# Patient Record
Sex: Male | Born: 1940 | Hispanic: No | Marital: Married | Smoking: Former smoker
Health system: Southern US, Community
[De-identification: ages and names within clinical notes are randomized; demographics above are authoritative.]

## PROBLEM LIST (undated history)

## (undated) DIAGNOSIS — I10 Essential (primary) hypertension: Secondary | ICD-10-CM

## (undated) DIAGNOSIS — F329 Major depressive disorder, single episode, unspecified: Secondary | ICD-10-CM

## (undated) DIAGNOSIS — I729 Aneurysm of unspecified site: Secondary | ICD-10-CM

## (undated) DIAGNOSIS — Z973 Presence of spectacles and contact lenses: Secondary | ICD-10-CM

## (undated) DIAGNOSIS — I340 Nonrheumatic mitral (valve) insufficiency: Secondary | ICD-10-CM

## (undated) DIAGNOSIS — R9431 Abnormal electrocardiogram [ECG] [EKG]: Secondary | ICD-10-CM

## (undated) DIAGNOSIS — R519 Headache, unspecified: Secondary | ICD-10-CM

## (undated) DIAGNOSIS — F0390 Unspecified dementia without behavioral disturbance: Secondary | ICD-10-CM

## (undated) DIAGNOSIS — I639 Cerebral infarction, unspecified: Secondary | ICD-10-CM

## (undated) DIAGNOSIS — F411 Generalized anxiety disorder: Secondary | ICD-10-CM

## (undated) DIAGNOSIS — R002 Palpitations: Secondary | ICD-10-CM

## (undated) DIAGNOSIS — C801 Malignant (primary) neoplasm, unspecified: Secondary | ICD-10-CM

## (undated) DIAGNOSIS — I441 Atrioventricular block, second degree: Secondary | ICD-10-CM

## (undated) DIAGNOSIS — E7800 Pure hypercholesterolemia, unspecified: Secondary | ICD-10-CM

## (undated) DIAGNOSIS — R413 Other amnesia: Secondary | ICD-10-CM

## (undated) HISTORY — DX: Nonrheumatic mitral (valve) insufficiency: I34.0

## (undated) HISTORY — DX: Abnormal electrocardiogram (ECG) (EKG): R94.31

## (undated) HISTORY — PX: CHOLECYSTECTOMY: SHX55

## (undated) HISTORY — DX: Palpitations: R00.2

## (undated) HISTORY — DX: Atrioventricular block, second degree: I44.1

## (undated) HISTORY — DX: Essential (primary) hypertension: I10

## (undated) HISTORY — DX: Major depressive disorder, single episode, unspecified: F32.9

## (undated) HISTORY — PX: CATARACT EXTRACTION, BILATERAL: SHX1313

## (undated) HISTORY — DX: Generalized anxiety disorder: F41.1

## (undated) HISTORY — DX: Pure hypercholesterolemia, unspecified: E78.00

## (undated) HISTORY — PX: COLON SURGERY: SHX602

## (undated) HISTORY — PX: PROSTATE BIOPSY: SHX241

---
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
## 2000-08-04 HISTORY — PX: MITRAL VALVE REPLACEMENT: SHX147

## 2006-10-08 ENCOUNTER — Encounter: Admission: RE | Admit: 2006-10-08 | Discharge: 2006-10-08 | Payer: Self-pay | Admitting: Family Medicine

## 2008-03-10 DIAGNOSIS — B009 Herpesviral infection, unspecified: Secondary | ICD-10-CM

## 2008-03-10 HISTORY — DX: Herpesviral infection, unspecified: B00.9

## 2008-07-12 ENCOUNTER — Encounter: Admission: RE | Admit: 2008-07-12 | Discharge: 2008-07-12 | Payer: Self-pay | Admitting: Family Medicine

## 2008-12-26 DIAGNOSIS — L84 Corns and callosities: Secondary | ICD-10-CM | POA: Insufficient documentation

## 2009-05-21 DIAGNOSIS — M21619 Bunion of unspecified foot: Secondary | ICD-10-CM | POA: Insufficient documentation

## 2009-10-24 DIAGNOSIS — J309 Allergic rhinitis, unspecified: Secondary | ICD-10-CM

## 2009-10-24 HISTORY — DX: Allergic rhinitis, unspecified: J30.9

## 2010-03-18 ENCOUNTER — Ambulatory Visit: Payer: Self-pay | Admitting: Cardiology

## 2010-04-15 ENCOUNTER — Ambulatory Visit: Payer: Self-pay | Admitting: Cardiology

## 2010-05-13 ENCOUNTER — Ambulatory Visit: Payer: Self-pay | Admitting: Cardiology

## 2010-05-28 ENCOUNTER — Ambulatory Visit: Payer: Self-pay | Admitting: Cardiology

## 2010-06-11 ENCOUNTER — Ambulatory Visit: Payer: Self-pay | Admitting: Cardiovascular Disease

## 2010-07-02 ENCOUNTER — Ambulatory Visit: Payer: Self-pay | Admitting: Cardiology

## 2010-07-10 ENCOUNTER — Ambulatory Visit: Payer: Self-pay | Admitting: Cardiology

## 2010-08-14 ENCOUNTER — Ambulatory Visit: Payer: Self-pay | Admitting: Cardiology

## 2010-09-23 ENCOUNTER — Encounter (INDEPENDENT_AMBULATORY_CARE_PROVIDER_SITE_OTHER): Payer: Medicare Other

## 2010-09-23 DIAGNOSIS — I059 Rheumatic mitral valve disease, unspecified: Secondary | ICD-10-CM

## 2010-09-23 DIAGNOSIS — Z7901 Long term (current) use of anticoagulants: Secondary | ICD-10-CM

## 2010-09-30 ENCOUNTER — Encounter (INDEPENDENT_AMBULATORY_CARE_PROVIDER_SITE_OTHER): Payer: Medicare Other

## 2010-09-30 DIAGNOSIS — I059 Rheumatic mitral valve disease, unspecified: Secondary | ICD-10-CM

## 2010-09-30 DIAGNOSIS — Z7901 Long term (current) use of anticoagulants: Secondary | ICD-10-CM

## 2010-10-07 DIAGNOSIS — I8390 Asymptomatic varicose veins of unspecified lower extremity: Secondary | ICD-10-CM

## 2010-10-07 DIAGNOSIS — N138 Other obstructive and reflux uropathy: Secondary | ICD-10-CM | POA: Insufficient documentation

## 2010-10-07 DIAGNOSIS — E559 Vitamin D deficiency, unspecified: Secondary | ICD-10-CM | POA: Insufficient documentation

## 2010-10-07 DIAGNOSIS — E7800 Pure hypercholesterolemia, unspecified: Secondary | ICD-10-CM | POA: Insufficient documentation

## 2010-10-07 HISTORY — DX: Asymptomatic varicose veins of unspecified lower extremity: I83.90

## 2010-10-07 HISTORY — DX: Vitamin D deficiency, unspecified: E55.9

## 2010-10-07 HISTORY — DX: Benign prostatic hyperplasia with lower urinary tract symptoms: N13.8

## 2010-10-07 HISTORY — DX: Pure hypercholesterolemia, unspecified: E78.00

## 2010-10-23 ENCOUNTER — Other Ambulatory Visit: Payer: Self-pay | Admitting: *Deleted

## 2010-10-23 DIAGNOSIS — R002 Palpitations: Secondary | ICD-10-CM

## 2010-10-23 MED ORDER — METOPROLOL SUCCINATE ER 50 MG PO TB24: 50.0000 mg | ORAL_TABLET | Freq: Every day | ORAL | Status: DC

## 2010-10-28 ENCOUNTER — Ambulatory Visit (INDEPENDENT_AMBULATORY_CARE_PROVIDER_SITE_OTHER): Payer: Medicare Other | Admitting: *Deleted

## 2010-10-28 DIAGNOSIS — Z7901 Long term (current) use of anticoagulants: Secondary | ICD-10-CM

## 2010-10-28 DIAGNOSIS — I059 Rheumatic mitral valve disease, unspecified: Secondary | ICD-10-CM

## 2010-10-28 LAB — POCT INR: INR: 3.2

## 2010-11-26 DIAGNOSIS — R972 Elevated prostate specific antigen [PSA]: Secondary | ICD-10-CM

## 2010-11-26 HISTORY — DX: Elevated prostate specific antigen (PSA): R97.20

## 2010-12-11 ENCOUNTER — Encounter: Payer: Self-pay | Admitting: Cardiology

## 2010-12-11 DIAGNOSIS — E7800 Pure hypercholesterolemia, unspecified: Secondary | ICD-10-CM | POA: Insufficient documentation

## 2010-12-11 DIAGNOSIS — R002 Palpitations: Secondary | ICD-10-CM | POA: Insufficient documentation

## 2010-12-11 DIAGNOSIS — I10 Essential (primary) hypertension: Secondary | ICD-10-CM | POA: Insufficient documentation

## 2010-12-11 DIAGNOSIS — I340 Nonrheumatic mitral (valve) insufficiency: Secondary | ICD-10-CM | POA: Insufficient documentation

## 2010-12-16 ENCOUNTER — Encounter: Payer: Self-pay | Admitting: Cardiology

## 2010-12-16 ENCOUNTER — Ambulatory Visit (INDEPENDENT_AMBULATORY_CARE_PROVIDER_SITE_OTHER): Payer: Medicare Other | Admitting: *Deleted

## 2010-12-16 ENCOUNTER — Ambulatory Visit (INDEPENDENT_AMBULATORY_CARE_PROVIDER_SITE_OTHER): Payer: Medicare Other | Admitting: Cardiology

## 2010-12-16 VITALS — BP 118/68 | HR 60 | Ht 67.0 in | Wt 191.5 lb

## 2010-12-16 DIAGNOSIS — I10 Essential (primary) hypertension: Secondary | ICD-10-CM

## 2010-12-16 DIAGNOSIS — R002 Palpitations: Secondary | ICD-10-CM

## 2010-12-16 DIAGNOSIS — I059 Rheumatic mitral valve disease, unspecified: Secondary | ICD-10-CM

## 2010-12-16 NOTE — Assessment & Plan Note (Signed)
Blood pressure is well-controlled today. 

## 2010-12-16 NOTE — Progress Notes (Signed)
   Nathan Mcdaniel Date of Birth: April 13, 1941   History of Present Illness: Nathan Mcdaniel is seen for followup today. 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 He has noted some mild palpitations in the evening. He denies any chest pain or shortness of breath. He doesn't really get any regular aerobic activity.  Current Outpatient Prescriptions on File Prior to Visit  Medication Sig Dispense Refill  . Cholecalciferol 5000 UNITS capsule Take 5,000 Units by mouth daily.        . Cyanocobalamin (VITAMIN B-12 PO) Take 1 tablet by mouth daily.        Marland Kitchen LORazepam (ATIVAN) 0.5 MG tablet Take 0.5 mg by mouth every 8 (eight) hours as needed.        . metoprolol (TOPROL XL) 50 MG 24 hr tablet Take 1 tablet (50 mg total) by mouth daily.  90 tablet  3  . Multiple Vitamin (MULTIVITAMIN) tablet Take 1 tablet by mouth daily.        . simvastatin (ZOCOR) 40 MG tablet Take 40 mg by mouth at bedtime.        . Tamsulosin HCl (FLOMAX) 0.4 MG CAPS Take 0.4 mg by mouth daily.        Marland Kitchen DISCONTD: warfarin (COUMADIN) 5 MG tablet Take 3 mg by mouth daily. AS DIRECTED        Allergies  Allergen Reactions  . Nsaids   . Percocet (Oxycodone-Acetaminophen)     Past Medical History  Diagnosis Date  . Hypertension   . Palpitations   . Mitral insufficiency   . Hypercholesterolemia     Past Surgical History  Procedure Date  . Mitral valve replacement 2002    with a st.jude mechanical valve  . Cholecystectomy   . Colon surgery     with diversion and resection  . Prostate biopsy     History  Smoking status  . Former Smoker -- 0.8 packs/day for 18 years  . Types: Cigarettes  . Quit date: 08/05/1979  Smokeless tobacco  . Never Used    History  Alcohol Use No    Family History  Problem Relation Age of Onset  . Heart failure Mother   . Hypertension Mother   . Lung cancer Sister     Review of Systems: The review of systems is positive for  some fatigue. He feels increased stress. All other systems were reviewed and are negative.  Physical Exam: BP 118/68  Pulse 60  Ht 5\' 7"  (1.702 m)  Wt 191 lb 8 oz (86.864 kg)  BMI 29.99 kg/m2 He is a pleasant male in no acute distress. He is normocephalic, atraumatic. Pupils are equal round and reactive. Sclera clear. Oropharynx is clear. Neck is supple without JVD, adenopathy, thyromegaly, or bruits. Lungs are clear. Cardiac exam reveals a regular rate and rhythm with a good mechanical mitral valve click. There is no gallop. Abdomen is soft and nontender. He has no masses or bruits. Pedal pulses are good. He has no edema. He is alert and oriented x3. Cranial nerves II through XII are intact.  LABORATORY DATA: INR today is 2.8. ECG demonstrates sinus bradycardia with first degree AV block and nonspecific T-wave abnormality.  Assessment / Plan:

## 2010-12-16 NOTE — Patient Instructions (Addendum)
Continue your current medications including coumadin.  Walk 30 minutes a day. This will help with stress.  Avoid caffeine and decongestants.  I will see you back in 6 months.

## 2010-12-16 NOTE — Assessment & Plan Note (Signed)
His palpitations are fairly minor at this point. He is on beta blocker therapy. I have recommended that he get regular aerobic exercise 30 minutes a day to try and help manage his stress. I recommended he avoid caffeine and decongestants.

## 2010-12-16 NOTE — Assessment & Plan Note (Signed)
His valve sounds are normal. He is anticoagulated appropriately. He will need long-term SBE prophylaxis.

## 2011-01-13 ENCOUNTER — Ambulatory Visit (INDEPENDENT_AMBULATORY_CARE_PROVIDER_SITE_OTHER): Payer: Medicare Other | Admitting: *Deleted

## 2011-01-13 DIAGNOSIS — I059 Rheumatic mitral valve disease, unspecified: Secondary | ICD-10-CM

## 2011-01-13 LAB — POCT INR: INR: 2.6

## 2011-02-10 ENCOUNTER — Ambulatory Visit (INDEPENDENT_AMBULATORY_CARE_PROVIDER_SITE_OTHER): Payer: Medicare Other | Admitting: *Deleted

## 2011-02-10 DIAGNOSIS — I059 Rheumatic mitral valve disease, unspecified: Secondary | ICD-10-CM

## 2011-02-11 LAB — POCT INR: INR: 3.1

## 2011-03-10 ENCOUNTER — Encounter: Payer: Medicare Other | Admitting: *Deleted

## 2011-03-10 ENCOUNTER — Ambulatory Visit (INDEPENDENT_AMBULATORY_CARE_PROVIDER_SITE_OTHER): Payer: Medicare Other | Admitting: *Deleted

## 2011-03-10 DIAGNOSIS — I059 Rheumatic mitral valve disease, unspecified: Secondary | ICD-10-CM

## 2011-03-24 ENCOUNTER — Ambulatory Visit (INDEPENDENT_AMBULATORY_CARE_PROVIDER_SITE_OTHER): Payer: Medicare Other | Admitting: *Deleted

## 2011-03-24 DIAGNOSIS — I059 Rheumatic mitral valve disease, unspecified: Secondary | ICD-10-CM

## 2011-03-24 LAB — POCT INR: INR: 2.7

## 2011-04-09 ENCOUNTER — Telehealth: Payer: Self-pay | Admitting: Cardiology

## 2011-04-09 NOTE — Telephone Encounter (Signed)
Faxed last OV Note, EKG, and ECHO available today.

## 2011-04-09 NOTE — Telephone Encounter (Signed)
Please fax recent test results and notes.

## 2011-04-09 NOTE — Telephone Encounter (Signed)
No Stress available.  All other info faxed today.

## 2011-04-16 ENCOUNTER — Ambulatory Visit (HOSPITAL_BASED_OUTPATIENT_CLINIC_OR_DEPARTMENT_OTHER)
Admission: RE | Admit: 2011-04-16 | Discharge: 2011-04-16 | Disposition: A | Discharge status: Home / Self Care | Payer: Medicare Other | Source: Ambulatory Visit | Attending: Orthopedic Surgery | Admitting: Orthopedic Surgery

## 2011-04-16 DIAGNOSIS — Z7901 Long term (current) use of anticoagulants: Secondary | ICD-10-CM | POA: Insufficient documentation

## 2011-04-16 DIAGNOSIS — Z01812 Encounter for preprocedural laboratory examination: Secondary | ICD-10-CM | POA: Insufficient documentation

## 2011-04-16 DIAGNOSIS — G5600 Carpal tunnel syndrome, unspecified upper limb: Secondary | ICD-10-CM | POA: Insufficient documentation

## 2011-04-16 LAB — POCT I-STAT, CHEM 8
BUN: 20 mg/dL (ref 6–23)
Calcium, Ion: 1.19 mmol/L (ref 1.12–1.32)
Chloride: 105 mEq/L (ref 96–112)
Creatinine, Ser: 0.9 mg/dL (ref 0.50–1.35)
Sodium: 140 mEq/L (ref 135–145)
TCO2: 27 mmol/L (ref 0–100)

## 2011-04-16 LAB — PROTIME-INR
INR: 2.35 — ABNORMAL HIGH (ref 0.00–1.49)
Prothrombin Time: 26.1 seconds — ABNORMAL HIGH (ref 11.6–15.2)

## 2011-04-21 ENCOUNTER — Ambulatory Visit (INDEPENDENT_AMBULATORY_CARE_PROVIDER_SITE_OTHER): Payer: Medicare Other | Admitting: *Deleted

## 2011-04-21 ENCOUNTER — Other Ambulatory Visit: Payer: Self-pay | Admitting: *Deleted

## 2011-04-21 DIAGNOSIS — I059 Rheumatic mitral valve disease, unspecified: Secondary | ICD-10-CM

## 2011-04-21 LAB — POCT INR: INR: 3.3

## 2011-05-06 NOTE — Op Note (Signed)
  NAME:  Nathan Mcdaniel, Nathan Mcdaniel NO.:  [id number]  MEDICAL RECORD NO.:  [id number]  LOCATION:                                 FACILITY:  PHYSICIAN:  Cindee Salt, M.D.            DATE OF BIRTH:  DATE OF PROCEDURE:  04/16/2011 DATE OF DISCHARGE:                              OPERATIVE REPORT   PREOPERATIVE DIAGNOSIS:  Carpal tunnel syndrome left hand.  POSTOPERATIVE DIAGNOSIS:  Carpal tunnel syndrome left hand.  OPERATION:  Decompression left median nerve.  SURGEON:  Cindee Salt, MD  ANESTHESIA:  Forearm based IV regional.  ANESTHESIOLOGIST:  Janetta Hora. Gelene Mink, MD  HISTORY:  The patient is a 70 year old male with a history of carpal tunnel syndrome.  EMG nerve conductions positive which has not responded to conservative treatment.  It was elected to undergo surgical decompression.  Pre, peri and postoperative course have been discussed along with risks and complications.  He is aware that there is no guarantee with surgery, possibility of infection, recurrence injury to arteries, nerves, tendons, incomplete relief of symptoms, and dystrophy. Preoperative area, the patient is seen.  The extremity marked by both the patient and surgeon.  Antibiotic given.  PROCEDURE:  The patient was brought to the operating room where a forearm based IV regional anesthetic was carried out without difficulty. He was prepped using ChloraPrep, supine position with left arm free.  A 3-minute dry time was allowed.  Time-out taken confirming the patient and procedure.  A longitudinal incision was made in the skin, carried down through subcutaneous tissue.  Bleeders were meticulously cauterized due to his being on Coumadin with bipolar.  The palmar fascia was split, superficial palmar arch identified, the flexor tendon to the ring and little finger identified.  To the ulnar side of the median nerve, the carpal retinaculum was incised with sharp dissection.  Right angle and Sewall  retractor were placed between skin and forearm fascia.  The fascia released for approximately a centimeter and half proximal to the wrist crease under direct vision.  The canal was explored.  Air of compression to the nerve was apparent.  No further lesions were identified.  Wound was irrigated.  The skin was closed after spraying thrombin to assure coagulation with interrupted 4-0 Vicryl Rapide sutures.  A sterile compressive dressing was applied.  The tourniquet deflated.  Pressure was held on the wound for 5 minutes.  The remainder of the dressing was placed.  On deflation of the tourniquet, all fingers immediately pinked.  He was taken to the recovery room for observation in satisfactory condition.  He will be discharged home to return to the [facility] in 1 week on Vicodin.          ______________________________ Cindee Salt, M.D.     GK/MEDQ  D:  04/16/2011  T:  04/16/2011  Job:  [id number]  cc:   Maryelizabeth Rowan, M.D.  Electronically Signed by Cindee Salt M.D. on 05/06/2011 04:40:01 PM

## 2011-05-19 ENCOUNTER — Encounter: Payer: Medicare Other | Admitting: *Deleted

## 2011-05-26 ENCOUNTER — Ambulatory Visit (INDEPENDENT_AMBULATORY_CARE_PROVIDER_SITE_OTHER): Payer: Medicare Other | Admitting: *Deleted

## 2011-05-26 DIAGNOSIS — I059 Rheumatic mitral valve disease, unspecified: Secondary | ICD-10-CM

## 2011-05-26 DIAGNOSIS — Z954 Presence of other heart-valve replacement: Secondary | ICD-10-CM

## 2011-05-26 LAB — POCT INR: INR: 2.5

## 2011-06-23 ENCOUNTER — Ambulatory Visit (INDEPENDENT_AMBULATORY_CARE_PROVIDER_SITE_OTHER): Payer: Medicare Other | Admitting: *Deleted

## 2011-06-23 DIAGNOSIS — I059 Rheumatic mitral valve disease, unspecified: Secondary | ICD-10-CM

## 2011-06-23 DIAGNOSIS — Z954 Presence of other heart-valve replacement: Secondary | ICD-10-CM

## 2011-06-23 LAB — POCT INR: INR: 2.7

## 2011-07-09 ENCOUNTER — Ambulatory Visit (INDEPENDENT_AMBULATORY_CARE_PROVIDER_SITE_OTHER): Payer: Medicare Other | Admitting: Cardiology

## 2011-07-09 ENCOUNTER — Encounter: Payer: Self-pay | Admitting: Cardiology

## 2011-07-09 VITALS — BP 138/80 | HR 58 | Ht 67.0 in | Wt 201.0 lb

## 2011-07-09 DIAGNOSIS — I10 Essential (primary) hypertension: Secondary | ICD-10-CM

## 2011-07-09 DIAGNOSIS — I059 Rheumatic mitral valve disease, unspecified: Secondary | ICD-10-CM

## 2011-07-09 DIAGNOSIS — Z952 Presence of prosthetic heart valve: Secondary | ICD-10-CM

## 2011-07-09 DIAGNOSIS — Z7901 Long term (current) use of anticoagulants: Secondary | ICD-10-CM

## 2011-07-09 DIAGNOSIS — E7800 Pure hypercholesterolemia, unspecified: Secondary | ICD-10-CM

## 2011-07-09 NOTE — Assessment & Plan Note (Signed)
Blood work is followed by his primary care. He is on statin therapy.

## 2011-07-09 NOTE — Progress Notes (Signed)
   Nathan Mcdaniel Date of Birth: 06/16/1941   History of Present Illness: Nathan Mcdaniel is seen for followup today. He did have surgery for carpal tunnel syndrome in his left wrist in September. He denies any chest pain or shortness of breath. He doesn't really get any regular aerobic activity. He reports that Dr. Duanne Guess checks his cholesterol and that his last level was 139.  Current Outpatient Prescriptions on File Prior to Visit  Medication Sig Dispense Refill  . Cholecalciferol 5000 UNITS capsule Take 5,000 Units by mouth daily.        . Cyanocobalamin (VITAMIN B-12 PO) Take 1 tablet by mouth daily.        Marland Kitchen LORazepam (ATIVAN) 0.5 MG tablet Take 0.5 mg by mouth every 8 (eight) hours as needed.        . metoprolol (TOPROL XL) 50 MG 24 hr tablet Take 1 tablet (50 mg total) by mouth daily.  90 tablet  3  . Multiple Vitamin (MULTIVITAMIN) tablet Take 1 tablet by mouth daily.        . simvastatin (ZOCOR) 40 MG tablet Take 40 mg by mouth at bedtime.        . Tamsulosin HCl (FLOMAX) 0.4 MG CAPS Take 0.4 mg by mouth daily.        Marland Kitchen warfarin (COUMADIN) 3 MG tablet Take 3 mg by mouth as directed.          Allergies  Allergen Reactions  . Nsaids   . Percocet (Oxycodone-Acetaminophen)     Past Medical History  Diagnosis Date  . Hypertension   . Palpitations   . Mitral insufficiency   . Hypercholesterolemia     Past Surgical History  Procedure Date  . Mitral valve replacement 2002    with a st.jude mechanical valve  . Cholecystectomy   . Colon surgery     with diversion and resection  . Prostate biopsy     History  Smoking status  . Former Smoker -- 0.8 packs/day for 18 years  . Types: Cigarettes  . Quit date: 08/05/1979  Smokeless tobacco  . Never Used    History  Alcohol Use No    Family History  Problem Relation Age of Onset  . Heart failure Mother   . Hypertension Mother   . Lung cancer Sister     Review of Systems: The review of systems is positive for some  fatigue. He still has some soreness at his wrist site. All other systems were reviewed and are negative.  Physical Exam: BP 138/80  Pulse 58  Ht 5\' 7"  (1.702 m)  Wt 91.173 kg (201 lb)  BMI 31.48 kg/m2 He is a pleasant male in no acute distress. He is normocephalic, atraumatic. Pupils are equal round and reactive. Sclera clear. Oropharynx is clear. Neck is supple without JVD, adenopathy, thyromegaly, or bruits. Lungs are clear. Cardiac exam reveals a regular rate and rhythm with occasional extrasystole and with a good mechanical mitral valve click. There is no gallop. Abdomen is soft and nontender. He has no masses or bruits. Pedal pulses are good. He has no edema. He is alert and oriented x3. Cranial nerves II through XII are intact.  LABORATORY DATA: INR last was 2.7.  Assessment / Plan:

## 2011-07-09 NOTE — Assessment & Plan Note (Signed)
Status post mechanical mitral valve replacement in 2002. He remains asymptomatic. His exam is stable. He will continue with chronic anticoagulation and routine SBE prophylaxis. I will followup again in 6 months.

## 2011-07-09 NOTE — Assessment & Plan Note (Signed)
Blood pressure is under excellent control today. 

## 2011-07-09 NOTE — Patient Instructions (Signed)
Continue your current medications and follow up in the coumadin clinic.  I will see you again in 6 months

## 2011-08-04 ENCOUNTER — Ambulatory Visit (INDEPENDENT_AMBULATORY_CARE_PROVIDER_SITE_OTHER): Payer: Medicare Other | Admitting: *Deleted

## 2011-08-04 DIAGNOSIS — Z954 Presence of other heart-valve replacement: Secondary | ICD-10-CM

## 2011-08-04 DIAGNOSIS — I059 Rheumatic mitral valve disease, unspecified: Secondary | ICD-10-CM

## 2011-08-27 ENCOUNTER — Other Ambulatory Visit: Payer: Self-pay | Admitting: Cardiology

## 2011-08-28 ENCOUNTER — Other Ambulatory Visit: Payer: Self-pay

## 2011-08-28 MED ORDER — WARFARIN SODIUM 3 MG PO TABS: ORAL_TABLET | ORAL | Status: DC

## 2011-09-15 ENCOUNTER — Ambulatory Visit (INDEPENDENT_AMBULATORY_CARE_PROVIDER_SITE_OTHER): Payer: Medicare Other | Admitting: *Deleted

## 2011-09-15 DIAGNOSIS — I059 Rheumatic mitral valve disease, unspecified: Secondary | ICD-10-CM

## 2011-10-14 DIAGNOSIS — I4891 Unspecified atrial fibrillation: Secondary | ICD-10-CM

## 2011-10-14 HISTORY — DX: Unspecified atrial fibrillation: I48.91

## 2011-10-27 ENCOUNTER — Ambulatory Visit (INDEPENDENT_AMBULATORY_CARE_PROVIDER_SITE_OTHER): Payer: Medicare Other

## 2011-10-27 DIAGNOSIS — I059 Rheumatic mitral valve disease, unspecified: Secondary | ICD-10-CM

## 2011-10-27 LAB — POCT INR: INR: 4.7

## 2011-11-10 ENCOUNTER — Ambulatory Visit (INDEPENDENT_AMBULATORY_CARE_PROVIDER_SITE_OTHER): Payer: Medicare Other

## 2011-11-10 DIAGNOSIS — I059 Rheumatic mitral valve disease, unspecified: Secondary | ICD-10-CM

## 2011-11-24 ENCOUNTER — Ambulatory Visit (INDEPENDENT_AMBULATORY_CARE_PROVIDER_SITE_OTHER): Payer: Medicare Other | Admitting: Pharmacist

## 2011-11-24 DIAGNOSIS — Z7901 Long term (current) use of anticoagulants: Secondary | ICD-10-CM

## 2011-11-24 DIAGNOSIS — I059 Rheumatic mitral valve disease, unspecified: Secondary | ICD-10-CM

## 2011-12-28 ENCOUNTER — Other Ambulatory Visit: Payer: Self-pay | Admitting: Cardiology

## 2012-01-05 ENCOUNTER — Ambulatory Visit (INDEPENDENT_AMBULATORY_CARE_PROVIDER_SITE_OTHER): Payer: Medicare Other | Admitting: Pharmacist

## 2012-01-05 DIAGNOSIS — I059 Rheumatic mitral valve disease, unspecified: Secondary | ICD-10-CM

## 2012-01-05 DIAGNOSIS — Z7901 Long term (current) use of anticoagulants: Secondary | ICD-10-CM

## 2012-01-05 LAB — POCT INR: INR: 3

## 2012-01-05 MED ORDER — WARFARIN SODIUM 3 MG PO TABS: ORAL_TABLET | ORAL | Status: DC

## 2012-01-19 ENCOUNTER — Encounter: Payer: Self-pay | Admitting: Cardiology

## 2012-01-19 ENCOUNTER — Ambulatory Visit (INDEPENDENT_AMBULATORY_CARE_PROVIDER_SITE_OTHER): Payer: Medicare Other | Admitting: Cardiology

## 2012-01-19 VITALS — BP 126/76 | HR 60

## 2012-01-19 DIAGNOSIS — Z952 Presence of prosthetic heart valve: Secondary | ICD-10-CM | POA: Insufficient documentation

## 2012-01-19 DIAGNOSIS — R002 Palpitations: Secondary | ICD-10-CM

## 2012-01-19 DIAGNOSIS — I059 Rheumatic mitral valve disease, unspecified: Secondary | ICD-10-CM

## 2012-01-19 DIAGNOSIS — I491 Atrial premature depolarization: Secondary | ICD-10-CM

## 2012-01-19 DIAGNOSIS — Z954 Presence of other heart-valve replacement: Secondary | ICD-10-CM

## 2012-01-19 DIAGNOSIS — Z7901 Long term (current) use of anticoagulants: Secondary | ICD-10-CM

## 2012-01-19 HISTORY — DX: Presence of prosthetic heart valve: Z95.2

## 2012-01-19 NOTE — Assessment & Plan Note (Signed)
ECG demonstrates PACs. These are benign and do not require further management. He is already on metoprolol.

## 2012-01-19 NOTE — Assessment & Plan Note (Signed)
His valve exam is normal today. He is on anticoagulation and has been therapeutic. We'll continue with his current management.

## 2012-01-19 NOTE — Progress Notes (Signed)
   [employer] [employer] Nathan Mcdaniel Date of Birth: 1940-08-25   History of Present Illness: Nathan Mcdaniel is seen for followup today. 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 He admits that he doesn't get enough exercise and is planning to join a gym. He feels that his heart rate is more regular than it has been in the past. He denies any chest pain or shortness of breath. His Coumadin checks have been therapeutic.  Current Outpatient Prescriptions on File Prior to Visit  Medication Sig Dispense Refill  . Cholecalciferol 5000 UNITS capsule Take 5,000 Units by mouth daily.        . Cyanocobalamin (VITAMIN B-12 PO) Take 1 tablet by mouth daily.        Marland Kitchen LORazepam (ATIVAN) 0.5 MG tablet Take 0.5 mg by mouth every 8 (eight) hours as needed.        . metoprolol succinate (TOPROL-XL) 50 MG 24 hr tablet TAKE 1 TABLET (50MG  TOTAL) DAILY  30 tablet  5  . Multiple Vitamin (MULTIVITAMIN) tablet Take 1 tablet by mouth daily.        . simvastatin (ZOCOR) 40 MG tablet Take 40 mg by mouth at bedtime.        . Tamsulosin HCl (FLOMAX) 0.4 MG CAPS Take 0.4 mg by mouth daily.        Marland Kitchen warfarin (COUMADIN) 3 MG tablet Take as directed by anticoagulation clinic.  Pt takes 1 tablet daily except 1 1/2 tablets on Monday  110 tablet  1    Allergies  Allergen Reactions  . Nsaids   . Percocet (Oxycodone-Acetaminophen)     Past Medical History  Diagnosis Date  . Hypertension   . Palpitations   . Mitral insufficiency   . Hypercholesterolemia     Past Surgical History  Procedure Date  . Mitral valve replacement 2002    with a st.jude mechanical valve  . Cholecystectomy   . Colon surgery     with diversion and resection  . Prostate biopsy     History  Smoking status  . Former Smoker -- 0.8 packs/day for 18 years  . Types: Cigarettes  . Quit date: 08/05/1979  Smokeless tobacco  . Never Used    History  Alcohol Use No    Family History  Problem Relation Age of Onset  . Heart failure  Mother   . Hypertension Mother   . Lung cancer Sister     Review of Systems: As noted in history of present illness. All other systems were reviewed and are negative.  Physical Exam: BP 126/76  Pulse 60 He is a pleasant male in no acute distress. He is normocephalic, atraumatic. Pupils are equal round and reactive. Sclera clear. Oropharynx is clear. Neck is supple without JVD, adenopathy, thyromegaly, or bruits. Lungs are clear. Cardiac exam reveals a regular rate and rhythm with occasional extrasystole and with a good mechanical mitral valve click. There is no gallop. Abdomen is soft and nontender. He has no masses or bruits. Pedal pulses are good. He has no edema. He is alert and oriented x3. Cranial nerves II through XII are intact.  LABORATORY DATA: INR last was 3.0 ECG today demonstrates normal sinus rhythm with frequent PACs. It is otherwise normal.  Assessment / Plan:

## 2012-01-19 NOTE — Patient Instructions (Signed)
Continue your current therapy  I will see you again in 6 months.   

## 2012-02-16 ENCOUNTER — Ambulatory Visit (INDEPENDENT_AMBULATORY_CARE_PROVIDER_SITE_OTHER): Payer: Medicare Other | Admitting: *Deleted

## 2012-02-16 DIAGNOSIS — Z7901 Long term (current) use of anticoagulants: Secondary | ICD-10-CM

## 2012-02-16 DIAGNOSIS — I059 Rheumatic mitral valve disease, unspecified: Secondary | ICD-10-CM

## 2012-02-16 LAB — POCT INR: INR: 4

## 2012-03-01 ENCOUNTER — Ambulatory Visit (INDEPENDENT_AMBULATORY_CARE_PROVIDER_SITE_OTHER): Payer: Medicare Other

## 2012-03-01 DIAGNOSIS — I059 Rheumatic mitral valve disease, unspecified: Secondary | ICD-10-CM

## 2012-03-01 DIAGNOSIS — Z7901 Long term (current) use of anticoagulants: Secondary | ICD-10-CM

## 2012-03-01 LAB — POCT INR: INR: 3.7

## 2012-03-15 ENCOUNTER — Ambulatory Visit (INDEPENDENT_AMBULATORY_CARE_PROVIDER_SITE_OTHER): Payer: Medicare Other | Admitting: Pharmacist

## 2012-03-15 DIAGNOSIS — Z7901 Long term (current) use of anticoagulants: Secondary | ICD-10-CM

## 2012-03-15 DIAGNOSIS — I059 Rheumatic mitral valve disease, unspecified: Secondary | ICD-10-CM

## 2012-03-15 LAB — POCT INR: INR: 3.5

## 2012-03-20 ENCOUNTER — Other Ambulatory Visit: Payer: Self-pay | Admitting: Cardiology

## 2012-04-12 ENCOUNTER — Ambulatory Visit (INDEPENDENT_AMBULATORY_CARE_PROVIDER_SITE_OTHER): Payer: Medicare Other

## 2012-04-12 DIAGNOSIS — I059 Rheumatic mitral valve disease, unspecified: Secondary | ICD-10-CM

## 2012-04-12 DIAGNOSIS — Z7901 Long term (current) use of anticoagulants: Secondary | ICD-10-CM

## 2012-04-12 LAB — POCT INR: INR: 3.3

## 2012-05-17 ENCOUNTER — Ambulatory Visit (INDEPENDENT_AMBULATORY_CARE_PROVIDER_SITE_OTHER): Payer: Medicare Other | Admitting: Pharmacist

## 2012-05-17 DIAGNOSIS — I059 Rheumatic mitral valve disease, unspecified: Secondary | ICD-10-CM

## 2012-05-17 DIAGNOSIS — Z7901 Long term (current) use of anticoagulants: Secondary | ICD-10-CM

## 2012-06-02 ENCOUNTER — Other Ambulatory Visit: Payer: Self-pay | Admitting: Cardiology

## 2012-06-28 ENCOUNTER — Ambulatory Visit (INDEPENDENT_AMBULATORY_CARE_PROVIDER_SITE_OTHER): Payer: Medicare Other

## 2012-06-28 DIAGNOSIS — Z7901 Long term (current) use of anticoagulants: Secondary | ICD-10-CM

## 2012-06-28 DIAGNOSIS — I059 Rheumatic mitral valve disease, unspecified: Secondary | ICD-10-CM

## 2012-07-26 ENCOUNTER — Ambulatory Visit (INDEPENDENT_AMBULATORY_CARE_PROVIDER_SITE_OTHER): Payer: Medicare Other | Admitting: Cardiology

## 2012-07-26 ENCOUNTER — Encounter: Payer: Self-pay | Admitting: Cardiology

## 2012-07-26 VITALS — BP 140/72 | HR 53 | Ht 67.0 in | Wt 200.0 lb

## 2012-07-26 DIAGNOSIS — I10 Essential (primary) hypertension: Secondary | ICD-10-CM

## 2012-07-26 DIAGNOSIS — I059 Rheumatic mitral valve disease, unspecified: Secondary | ICD-10-CM

## 2012-07-26 DIAGNOSIS — Z952 Presence of prosthetic heart valve: Secondary | ICD-10-CM

## 2012-07-26 DIAGNOSIS — Z954 Presence of other heart-valve replacement: Secondary | ICD-10-CM

## 2012-07-26 NOTE — Progress Notes (Signed)
Nathan Mcdaniel Date of Birth: 11/08/1940   History of Present Illness: Nathan Mcdaniel is seen for followup today. He continues to do very well. He denies any symptoms of chest pain, shortness of breath, or palpitations. He did have a colonoscopy this year which was normal. His Coumadin has been therapeutic. He denies any bleeding. He's had no TIA or CVA symptoms.  Current Outpatient Prescriptions on File Prior to Visit  Medication Sig Dispense Refill  . Cholecalciferol 5000 UNITS capsule Take 5,000 Units by mouth daily.        . Cyanocobalamin (VITAMIN B-12 PO) Take 1 tablet by mouth daily.        Marland Kitchen LORazepam (ATIVAN) 0.5 MG tablet Take 0.5 mg by mouth every 8 (eight) hours as needed.        . metoprolol succinate (TOPROL-XL) 50 MG 24 hr tablet TAKE 1 TABLET DAILY  90 tablet  3  . Multiple Vitamin (MULTIVITAMIN) tablet Take 1 tablet by mouth daily.        . simvastatin (ZOCOR) 40 MG tablet Take 40 mg by mouth at bedtime.        . Tamsulosin HCl (FLOMAX) 0.4 MG CAPS Take 0.4 mg by mouth daily.        Marland Kitchen warfarin (COUMADIN) 3 MG tablet Take as directed by anticoagulation clinic.  Pt takes 1 tablet daily except 1 1/2 tablets on Monday  110 tablet  1  . warfarin (COUMADIN) 3 MG tablet TAKE AS DIRECTED BY        ANTICOAGULATION CLINIC  110 tablet  1    Allergies  Allergen Reactions  . Nsaids   . Percocet (Oxycodone-Acetaminophen)     Past Medical History  Diagnosis Date  . Hypertension   . Palpitations   . Mitral insufficiency   . Hypercholesterolemia     Past Surgical History  Procedure Date  . Mitral valve replacement 2002    with a st.jude mechanical valve  . Cholecystectomy   . Colon surgery     with diversion and resection  . Prostate biopsy     History  Smoking status  . Former Smoker -- 0.8 packs/day for 18 years  . Types: Cigarettes  . Quit date: 08/05/1979  Smokeless tobacco  . Never Used    History  Alcohol Use No    Family History  Problem Relation Age of  Onset  . Heart failure Mother   . Hypertension Mother   . Lung cancer Sister     Review of Systems: As noted in history of present illness. All other systems were reviewed and are negative.  Physical Exam: BP 140/72  Pulse 53  Ht 5\' 7"  (1.702 m)  Wt 200 lb (90.719 kg)  BMI 31.32 kg/m2 He is a pleasant male in no acute distress. He is normocephalic, atraumatic. Pupils are equal round and reactive. Sclera clear. Oropharynx is clear. Neck is supple without JVD, adenopathy, thyromegaly, or bruits. Lungs are clear. Cardiac exam reveals a regular rate and rhythm with a good mechanical mitral valve click. There is no gallop. Abdomen is soft and nontender. He has no masses or bruits. Pedal pulses are good. He has no edema. He is alert and oriented x3. Cranial nerves II through XII are intact.  LABORATORY DATA: INR last was 2.9   Assessment / Plan: 1. Status post mechanical mitral valve replacement in 2002. Good valve click on exam. Patient is therapeutic on his Coumadin. We will continue with his current therapy.  2. Chronic  anticoagulation. Next  3. PACs. Minimally symptomatic. Continue beta blocker therapy.

## 2012-07-26 NOTE — Patient Instructions (Signed)
Continue your current therapy  I will see you again in 6 months.   

## 2012-07-27 ENCOUNTER — Other Ambulatory Visit: Payer: Self-pay | Admitting: Cardiology

## 2012-07-29 MED ORDER — WARFARIN SODIUM 3 MG PO TABS: ORAL_TABLET | ORAL | Status: DC

## 2012-08-09 ENCOUNTER — Ambulatory Visit (INDEPENDENT_AMBULATORY_CARE_PROVIDER_SITE_OTHER): Payer: Medicare Other

## 2012-08-09 DIAGNOSIS — Z7901 Long term (current) use of anticoagulants: Secondary | ICD-10-CM

## 2012-08-09 DIAGNOSIS — I059 Rheumatic mitral valve disease, unspecified: Secondary | ICD-10-CM

## 2012-08-09 LAB — POCT INR: INR: 2.8

## 2012-09-20 ENCOUNTER — Ambulatory Visit (INDEPENDENT_AMBULATORY_CARE_PROVIDER_SITE_OTHER): Payer: Medicare Other | Admitting: *Deleted

## 2012-09-20 DIAGNOSIS — Z7901 Long term (current) use of anticoagulants: Secondary | ICD-10-CM

## 2012-09-20 DIAGNOSIS — I059 Rheumatic mitral valve disease, unspecified: Secondary | ICD-10-CM

## 2012-09-20 LAB — POCT INR: INR: 3

## 2012-11-01 ENCOUNTER — Ambulatory Visit (INDEPENDENT_AMBULATORY_CARE_PROVIDER_SITE_OTHER): Payer: Medicare Other | Admitting: Pharmacist

## 2012-11-01 DIAGNOSIS — Z7901 Long term (current) use of anticoagulants: Secondary | ICD-10-CM

## 2012-11-01 DIAGNOSIS — I059 Rheumatic mitral valve disease, unspecified: Secondary | ICD-10-CM

## 2012-12-13 ENCOUNTER — Ambulatory Visit (INDEPENDENT_AMBULATORY_CARE_PROVIDER_SITE_OTHER): Payer: Medicare Other

## 2012-12-13 DIAGNOSIS — Z7901 Long term (current) use of anticoagulants: Secondary | ICD-10-CM

## 2012-12-13 DIAGNOSIS — I059 Rheumatic mitral valve disease, unspecified: Secondary | ICD-10-CM

## 2012-12-13 LAB — POCT INR: INR: 2.5

## 2013-01-24 ENCOUNTER — Ambulatory Visit (INDEPENDENT_AMBULATORY_CARE_PROVIDER_SITE_OTHER): Payer: Medicare Other | Admitting: *Deleted

## 2013-01-24 DIAGNOSIS — Z7901 Long term (current) use of anticoagulants: Secondary | ICD-10-CM

## 2013-01-24 DIAGNOSIS — I059 Rheumatic mitral valve disease, unspecified: Secondary | ICD-10-CM

## 2013-01-24 LAB — POCT INR: INR: 2.7

## 2013-03-07 ENCOUNTER — Ambulatory Visit (INDEPENDENT_AMBULATORY_CARE_PROVIDER_SITE_OTHER): Payer: Medicare Other | Admitting: *Deleted

## 2013-03-07 DIAGNOSIS — Z7901 Long term (current) use of anticoagulants: Secondary | ICD-10-CM

## 2013-03-07 DIAGNOSIS — I059 Rheumatic mitral valve disease, unspecified: Secondary | ICD-10-CM

## 2013-04-11 ENCOUNTER — Ambulatory Visit (INDEPENDENT_AMBULATORY_CARE_PROVIDER_SITE_OTHER): Payer: Medicare Other | Admitting: *Deleted

## 2013-04-11 DIAGNOSIS — Z7901 Long term (current) use of anticoagulants: Secondary | ICD-10-CM

## 2013-04-11 DIAGNOSIS — I059 Rheumatic mitral valve disease, unspecified: Secondary | ICD-10-CM

## 2013-04-25 ENCOUNTER — Ambulatory Visit (INDEPENDENT_AMBULATORY_CARE_PROVIDER_SITE_OTHER): Payer: Medicare Other | Admitting: *Deleted

## 2013-04-25 DIAGNOSIS — I059 Rheumatic mitral valve disease, unspecified: Secondary | ICD-10-CM

## 2013-04-25 DIAGNOSIS — Z7901 Long term (current) use of anticoagulants: Secondary | ICD-10-CM

## 2013-05-16 ENCOUNTER — Encounter (INDEPENDENT_AMBULATORY_CARE_PROVIDER_SITE_OTHER): Payer: Self-pay

## 2013-05-16 ENCOUNTER — Ambulatory Visit (INDEPENDENT_AMBULATORY_CARE_PROVIDER_SITE_OTHER): Payer: Medicare Other | Admitting: *Deleted

## 2013-05-16 DIAGNOSIS — I059 Rheumatic mitral valve disease, unspecified: Secondary | ICD-10-CM

## 2013-05-16 DIAGNOSIS — Z7901 Long term (current) use of anticoagulants: Secondary | ICD-10-CM

## 2013-05-30 ENCOUNTER — Ambulatory Visit (INDEPENDENT_AMBULATORY_CARE_PROVIDER_SITE_OTHER): Payer: Medicare Other | Admitting: Pharmacist

## 2013-05-30 DIAGNOSIS — Z7901 Long term (current) use of anticoagulants: Secondary | ICD-10-CM

## 2013-05-30 DIAGNOSIS — I059 Rheumatic mitral valve disease, unspecified: Secondary | ICD-10-CM

## 2013-06-17 ENCOUNTER — Other Ambulatory Visit: Payer: Self-pay | Admitting: Cardiology

## 2013-06-18 ENCOUNTER — Other Ambulatory Visit: Payer: Self-pay | Admitting: Cardiology

## 2013-06-27 ENCOUNTER — Ambulatory Visit (INDEPENDENT_AMBULATORY_CARE_PROVIDER_SITE_OTHER): Payer: Medicare Other | Admitting: *Deleted

## 2013-06-27 ENCOUNTER — Encounter (INDEPENDENT_AMBULATORY_CARE_PROVIDER_SITE_OTHER): Payer: Self-pay

## 2013-06-27 DIAGNOSIS — I059 Rheumatic mitral valve disease, unspecified: Secondary | ICD-10-CM

## 2013-06-27 DIAGNOSIS — Z7901 Long term (current) use of anticoagulants: Secondary | ICD-10-CM

## 2013-07-18 ENCOUNTER — Ambulatory Visit (INDEPENDENT_AMBULATORY_CARE_PROVIDER_SITE_OTHER): Payer: Medicare Other | Admitting: *Deleted

## 2013-07-18 DIAGNOSIS — I059 Rheumatic mitral valve disease, unspecified: Secondary | ICD-10-CM

## 2013-07-18 DIAGNOSIS — Z7901 Long term (current) use of anticoagulants: Secondary | ICD-10-CM

## 2013-07-18 LAB — POCT INR: INR: 2.6

## 2013-08-08 ENCOUNTER — Encounter: Payer: Self-pay | Admitting: Cardiology

## 2013-08-08 ENCOUNTER — Ambulatory Visit (INDEPENDENT_AMBULATORY_CARE_PROVIDER_SITE_OTHER): Payer: Medicare Other | Admitting: Cardiology

## 2013-08-08 VITALS — BP 120/76 | HR 76 | Ht 67.0 in | Wt 193.0 lb

## 2013-08-08 DIAGNOSIS — I059 Rheumatic mitral valve disease, unspecified: Secondary | ICD-10-CM

## 2013-08-08 DIAGNOSIS — Z952 Presence of prosthetic heart valve: Secondary | ICD-10-CM

## 2013-08-08 DIAGNOSIS — R9431 Abnormal electrocardiogram [ECG] [EKG]: Secondary | ICD-10-CM

## 2013-08-08 DIAGNOSIS — I441 Atrioventricular block, second degree: Secondary | ICD-10-CM | POA: Insufficient documentation

## 2013-08-08 DIAGNOSIS — I10 Essential (primary) hypertension: Secondary | ICD-10-CM

## 2013-08-08 DIAGNOSIS — Z954 Presence of other heart-valve replacement: Secondary | ICD-10-CM

## 2013-08-08 NOTE — Patient Instructions (Signed)
Reduce Metoprolol to 25 mg daily  Continue your other therapy  We will schedule you for an echocardiogram and exercise stress test.

## 2013-08-08 NOTE — Progress Notes (Signed)
Nathan Mcdaniel Date of Birth: [birth year]   History of Present Illness: Nathan Mcdaniel is seen for yearly followup today. He is s/p mechanical MVR in 2002. He has a history of PACs. He continues to do very well. He denies any symptoms of chest pain, shortness of breath, or palpitations.His Coumadin has been therapeutic. He denies any bleeding. He's had no TIA or CVA symptoms. He thinks his palpitations are improved. He has lost weight and he attributes this to eating less and exercising more.  Current Outpatient Prescriptions on File Prior to Visit  Medication Sig Dispense Refill  . Cholecalciferol 5000 UNITS capsule Take 5,000 Units by mouth daily.        . Cyanocobalamin (VITAMIN B-12 PO) Take 1 tablet by mouth daily.        Marland Kitchen LORazepam (ATIVAN) 0.5 MG tablet Take 0.5 mg by mouth every 8 (eight) hours as needed.        . metoprolol succinate (TOPROL-XL) 50 MG 24 hr tablet TAKE 1 TABLET DAILY  90 tablet  3  . metoprolol succinate (TOPROL-XL) 50 MG 24 hr tablet TAKE 1 TABLET DAILY  90 tablet  0  . Multiple Vitamin (MULTIVITAMIN) tablet Take 1 tablet by mouth daily.        . simvastatin (ZOCOR) 40 MG tablet Take 40 mg by mouth at bedtime.        . Tamsulosin HCl (FLOMAX) 0.4 MG CAPS Take 0.4 mg by mouth daily.        Marland Kitchen warfarin (COUMADIN) 3 MG tablet TAKE AS DIRECTED BY        ANTICOAGULATION CLINIC  110 tablet  1   No current facility-administered medications on file prior to visit.    No Known Allergies  Past Medical History  Diagnosis Date  . Hypertension   . Palpitations   . Mitral insufficiency   . Hypercholesterolemia   . Mobitz type 1 second degree atrioventricular block   . Abnormal ECG     Past Surgical History  Procedure Laterality Date  . Mitral valve replacement  2002    with a st.jude mechanical valve  . Cholecystectomy    . Colon surgery      with diversion and resection  . Prostate biopsy      History  Smoking status  . Former Smoker -- 0.80 packs/day for 18  years  . Types: Cigarettes  . Quit date: 08/05/1979  Smokeless tobacco  . Never Used    History  Alcohol Use No    Family History  Problem Relation Age of Onset  . Heart failure Mother   . Hypertension Mother   . Lung cancer Sister     Review of Systems: As noted in history of present illness. All other systems were reviewed and are negative.  Physical Exam: BP 120/76  Pulse 76  Ht 5\' 7"  (1.702 m)  Wt 193 lb (87.544 kg)  BMI 30.22 kg/m2 He is a pleasant male in no acute distress. He is normocephalic, atraumatic. Pupils are equal round and reactive. Sclera clear. Oropharynx is clear. Neck is supple without JVD, adenopathy, thyromegaly, or bruits. Lungs are clear. Cardiac exam reveals a regular rate and rhythm with a good mechanical mitral valve click. There is no gallop. Abdomen is soft and nontender. He has no masses or bruits. Pedal pulses are good. He has no edema. He is alert and oriented x3. Cranial nerves II through XII are intact.  LABORATORY DATA: INR last was 2.6  Ecg today demonstrates  NSR with second degree AV block Mobitz type 1. HR 76 bpm. There is marked T wave inversion in the anterolateral leads that is new since 2013.   Assessment / Plan: 1. Status post mechanical mitral valve replacement in 2002. Good valve click on exam. Patient is therapeutic on his Coumadin. We will continue with his current therapy.  2. Chronic anticoagulation.   3. PACs. Minimally symptomatic. Continue beta blocker therapy.  4. Mobitz type 1 second degree AV block. New. Asymptomatic. Will reduce metoprolol to 25 mg daily.  5. Abnormal Ecg. Marked anterolateral T wave inversion that is new. No clear symptoms. Will schedule for echocardiogram and nuclear stress test.

## 2013-08-15 ENCOUNTER — Ambulatory Visit (INDEPENDENT_AMBULATORY_CARE_PROVIDER_SITE_OTHER): Payer: Medicare Other | Admitting: *Deleted

## 2013-08-15 DIAGNOSIS — I059 Rheumatic mitral valve disease, unspecified: Secondary | ICD-10-CM

## 2013-08-15 DIAGNOSIS — Z7901 Long term (current) use of anticoagulants: Secondary | ICD-10-CM

## 2013-08-15 LAB — POCT INR: INR: 2.5

## 2013-08-23 ENCOUNTER — Ambulatory Visit (HOSPITAL_COMMUNITY): Payer: Medicare Other | Attending: Cardiology | Admitting: Radiology

## 2013-08-23 DIAGNOSIS — I059 Rheumatic mitral valve disease, unspecified: Secondary | ICD-10-CM

## 2013-08-23 DIAGNOSIS — R002 Palpitations: Secondary | ICD-10-CM | POA: Insufficient documentation

## 2013-08-23 DIAGNOSIS — R9431 Abnormal electrocardiogram [ECG] [EKG]: Secondary | ICD-10-CM

## 2013-08-23 DIAGNOSIS — I10 Essential (primary) hypertension: Secondary | ICD-10-CM

## 2013-08-23 DIAGNOSIS — E785 Hyperlipidemia, unspecified: Secondary | ICD-10-CM | POA: Insufficient documentation

## 2013-08-23 DIAGNOSIS — I441 Atrioventricular block, second degree: Secondary | ICD-10-CM

## 2013-08-23 DIAGNOSIS — Z87891 Personal history of nicotine dependence: Secondary | ICD-10-CM | POA: Insufficient documentation

## 2013-08-23 DIAGNOSIS — I079 Rheumatic tricuspid valve disease, unspecified: Secondary | ICD-10-CM | POA: Insufficient documentation

## 2013-08-23 DIAGNOSIS — I379 Nonrheumatic pulmonary valve disorder, unspecified: Secondary | ICD-10-CM | POA: Insufficient documentation

## 2013-08-23 DIAGNOSIS — Z952 Presence of prosthetic heart valve: Secondary | ICD-10-CM

## 2013-08-23 NOTE — Progress Notes (Signed)
Echocardiogram performed.  

## 2013-08-30 ENCOUNTER — Ambulatory Visit (HOSPITAL_COMMUNITY): Payer: Medicare Other | Attending: Cardiology | Admitting: Radiology

## 2013-08-30 ENCOUNTER — Encounter: Payer: Self-pay | Admitting: Cardiology

## 2013-08-30 VITALS — BP 121/81 | Ht 67.0 in | Wt 192.0 lb

## 2013-08-30 DIAGNOSIS — R0989 Other specified symptoms and signs involving the circulatory and respiratory systems: Secondary | ICD-10-CM

## 2013-08-30 DIAGNOSIS — I059 Rheumatic mitral valve disease, unspecified: Secondary | ICD-10-CM

## 2013-08-30 DIAGNOSIS — Z87891 Personal history of nicotine dependence: Secondary | ICD-10-CM | POA: Insufficient documentation

## 2013-08-30 DIAGNOSIS — R0609 Other forms of dyspnea: Secondary | ICD-10-CM | POA: Insufficient documentation

## 2013-08-30 DIAGNOSIS — R002 Palpitations: Secondary | ICD-10-CM | POA: Insufficient documentation

## 2013-08-30 DIAGNOSIS — R5383 Other fatigue: Secondary | ICD-10-CM

## 2013-08-30 DIAGNOSIS — Z8249 Family history of ischemic heart disease and other diseases of the circulatory system: Secondary | ICD-10-CM | POA: Insufficient documentation

## 2013-08-30 DIAGNOSIS — I4949 Other premature depolarization: Secondary | ICD-10-CM

## 2013-08-30 DIAGNOSIS — R5381 Other malaise: Secondary | ICD-10-CM | POA: Insufficient documentation

## 2013-08-30 DIAGNOSIS — Z952 Presence of prosthetic heart valve: Secondary | ICD-10-CM

## 2013-08-30 DIAGNOSIS — I441 Atrioventricular block, second degree: Secondary | ICD-10-CM

## 2013-08-30 DIAGNOSIS — R9431 Abnormal electrocardiogram [ECG] [EKG]: Secondary | ICD-10-CM

## 2013-08-30 DIAGNOSIS — I10 Essential (primary) hypertension: Secondary | ICD-10-CM | POA: Insufficient documentation

## 2013-08-30 MED ORDER — TECHNETIUM TC 99M SESTAMIBI GENERIC - CARDIOLITE
30.0000 | Freq: Once | INTRAVENOUS | Status: AC | PRN
  Administered 2013-08-30: 30 via INTRAVENOUS

## 2013-08-30 MED ORDER — TECHNETIUM TC 99M SESTAMIBI GENERIC - CARDIOLITE
10.0000 | Freq: Once | INTRAVENOUS | Status: AC | PRN
  Administered 2013-08-30: 10 via INTRAVENOUS

## 2013-08-30 NOTE — Progress Notes (Signed)
  [facility] [address] [place], [place] [address] [phone]    Cardiology Nuclear Med Study  Nathan Mcdaniel is a 73 y.o. male     MRN : [id number]     DOB: [birth year]  Procedure Date: 08/30/2013  Nuclear Med Background Indication for Stress Test:  Evaluation for Ischemia and Abnormal LHT:DSKAJG degree AV,TWI History:  MVR, 01/15 ECHO: EF: 50-55%, 15 yrs ago MPI: [place] NL Cardiac Risk Factors: Family History - CAD, History of Smoking, Hypertension and Lipids  Symptoms:  DOE and Palpitations   Nuclear Pre-Procedure Caffeine/Decaff Intake:  8:30pm NPO After: 8:30pm   Lungs:  clear O2 Sat: 98% on room air. IV 0.9% NS with Angio Cath:  22g  IV Site: R Hand  IV Started by:  Matilde Haymaker, RN  Chest Size (in):  42 Cup Size: n/a  Height: 5\' 7"  (1.702 m)  Weight:  192 lb (87.091 kg)  BMI:  Body mass index is 30.06 kg/(m^2). Tech Comments:  No Toprol x 36 hrs    Nuclear Med Study 1 or 2 day study: 1 day  Stress Test Type:  Stress  Reading MD: n/a  Order Authorizing Provider:  Geanie Cooley  Resting Radionuclide: Technetium 15m Sestamibi  Resting Radionuclide Dose: 11.0 mCi   Stress Radionuclide:  Technetium 54m Sestamibi  Stress Radionuclide Dose: 33.0 mCi           Stress Protocol Rest HR: 74 Stress HR: 142  Rest BP: 121/81 Stress BP: 159/70  Exercise Time (min): 4:01 METS: 5.80   Predicted Max HR: 146 bpm % Max HR: 97.26 bpm Rate Pressure Product: 22578   Dose of Adenosine (mg):  n/a Dose of Lexiscan: n/a mg  Dose of Atropine (mg): n/a Dose of Dobutamine: n/a mcg/kg/min (at max HR)  Stress Test Technologist: Perrin Maltese, EMT-P  Nuclear Technologist:  Charlton Amor, CNMT     Rest Procedure:  Myocardial perfusion imaging was performed at rest 45 minutes following the intravenous administration of Technetium 63m Sestamibi. Rest ECG: SR, negative T waves in anterolateral and inferior leads  Stress Procedure:  The  patient exercised on the treadmill utilizing the Bruce Protocol for 4:01 minutes. The patient stopped due to sob, fatigue, and denied any chest pain.  Technetium 70m Sestamibi was injected at peak exercise and myocardial perfusion imaging was performed after a brief delay. Stress ECG: Frequent PACs and PVCs  QPS Raw Data Images: There is diaphragmatic attenuation. Stress Images:  Normal homogeneous uptake in all areas of the myocardium. Rest Images:  Normal homogeneous uptake in all areas of the myocardium. Subtraction (SDS):  No evidence of ischemia. Transient Ischemic Dilatation (Normal <1.22):  0.98 Lung/Heart Ratio (Normal <0.45):  0.29  Quantitative Gated Spect Images QGS EDV:  140 ml QGS ESV:  72 ml  Impression Exercise Capacity:  Fair exercise capacity. BP Response:  Normal blood pressure response. Clinical Symptoms:  Dyspnea, fatigue ECG Impression:  No significant ST segment change suggestive of ischemia. Comparison with Prior Nuclear Study: No previous study  Overall Impression:  Normal stress nuclear study.  LV Ejection Fraction: 49%.  LV Wall Motion:  Paradoxican septal motion, otherwise no regional wall motion abnormalities, low normal LVEF.  Ena Dawley, Lemmie Evens 08/30/2013

## 2013-08-31 ENCOUNTER — Telehealth: Payer: Self-pay | Admitting: Cardiology

## 2013-08-31 NOTE — Telephone Encounter (Signed)
New message  ° ° °Returning call back to nurse.  °

## 2013-08-31 NOTE — Telephone Encounter (Signed)
Returned call to patient myoview and echo results given. 

## 2013-09-12 ENCOUNTER — Ambulatory Visit (INDEPENDENT_AMBULATORY_CARE_PROVIDER_SITE_OTHER): Payer: Medicare Other | Admitting: Pharmacist

## 2013-09-12 DIAGNOSIS — Z7901 Long term (current) use of anticoagulants: Secondary | ICD-10-CM

## 2013-09-12 DIAGNOSIS — I059 Rheumatic mitral valve disease, unspecified: Secondary | ICD-10-CM

## 2013-09-12 LAB — POCT INR: INR: 4

## 2013-09-16 ENCOUNTER — Telehealth: Payer: Self-pay | Admitting: Cardiology

## 2013-09-26 ENCOUNTER — Ambulatory Visit (INDEPENDENT_AMBULATORY_CARE_PROVIDER_SITE_OTHER): Payer: Medicare Other | Admitting: *Deleted

## 2013-09-26 DIAGNOSIS — Z5181 Encounter for therapeutic drug level monitoring: Secondary | ICD-10-CM

## 2013-09-26 DIAGNOSIS — Z7901 Long term (current) use of anticoagulants: Secondary | ICD-10-CM

## 2013-09-26 DIAGNOSIS — I059 Rheumatic mitral valve disease, unspecified: Secondary | ICD-10-CM

## 2013-09-26 LAB — POCT INR: INR: 4.1

## 2013-10-10 ENCOUNTER — Ambulatory Visit (INDEPENDENT_AMBULATORY_CARE_PROVIDER_SITE_OTHER): Payer: Medicare Other | Admitting: *Deleted

## 2013-10-10 DIAGNOSIS — I059 Rheumatic mitral valve disease, unspecified: Secondary | ICD-10-CM

## 2013-10-10 DIAGNOSIS — Z7901 Long term (current) use of anticoagulants: Secondary | ICD-10-CM

## 2013-10-10 DIAGNOSIS — Z5181 Encounter for therapeutic drug level monitoring: Secondary | ICD-10-CM

## 2013-10-10 LAB — POCT INR: INR: 2.4

## 2013-10-31 ENCOUNTER — Ambulatory Visit (INDEPENDENT_AMBULATORY_CARE_PROVIDER_SITE_OTHER): Payer: Medicare Other | Admitting: Pharmacist

## 2013-10-31 DIAGNOSIS — Z5181 Encounter for therapeutic drug level monitoring: Secondary | ICD-10-CM

## 2013-10-31 DIAGNOSIS — Z7901 Long term (current) use of anticoagulants: Secondary | ICD-10-CM

## 2013-10-31 DIAGNOSIS — I059 Rheumatic mitral valve disease, unspecified: Secondary | ICD-10-CM

## 2013-10-31 LAB — POCT INR: INR: 2.4

## 2013-11-21 ENCOUNTER — Ambulatory Visit (INDEPENDENT_AMBULATORY_CARE_PROVIDER_SITE_OTHER): Payer: Medicare Other | Admitting: *Deleted

## 2013-11-21 DIAGNOSIS — I059 Rheumatic mitral valve disease, unspecified: Secondary | ICD-10-CM

## 2013-11-21 DIAGNOSIS — Z5181 Encounter for therapeutic drug level monitoring: Secondary | ICD-10-CM

## 2013-11-21 DIAGNOSIS — Z7901 Long term (current) use of anticoagulants: Secondary | ICD-10-CM

## 2013-11-21 LAB — POCT INR: INR: 2.6

## 2013-12-06 NOTE — Telephone Encounter (Signed)
error 

## 2013-12-19 ENCOUNTER — Ambulatory Visit (INDEPENDENT_AMBULATORY_CARE_PROVIDER_SITE_OTHER): Payer: Medicare Other | Admitting: *Deleted

## 2013-12-19 DIAGNOSIS — Z5181 Encounter for therapeutic drug level monitoring: Secondary | ICD-10-CM

## 2013-12-19 DIAGNOSIS — I059 Rheumatic mitral valve disease, unspecified: Secondary | ICD-10-CM

## 2013-12-19 DIAGNOSIS — Z7901 Long term (current) use of anticoagulants: Secondary | ICD-10-CM

## 2013-12-19 LAB — POCT INR: INR: 4.3

## 2014-01-02 ENCOUNTER — Ambulatory Visit (INDEPENDENT_AMBULATORY_CARE_PROVIDER_SITE_OTHER): Payer: Medicare Other | Admitting: *Deleted

## 2014-01-02 DIAGNOSIS — Z5181 Encounter for therapeutic drug level monitoring: Secondary | ICD-10-CM

## 2014-01-02 DIAGNOSIS — Z7901 Long term (current) use of anticoagulants: Secondary | ICD-10-CM

## 2014-01-02 DIAGNOSIS — I059 Rheumatic mitral valve disease, unspecified: Secondary | ICD-10-CM

## 2014-01-02 LAB — POCT INR: INR: 2.6

## 2014-01-16 ENCOUNTER — Ambulatory Visit (INDEPENDENT_AMBULATORY_CARE_PROVIDER_SITE_OTHER): Payer: Medicare Other | Admitting: Pharmacist

## 2014-01-16 DIAGNOSIS — Z7901 Long term (current) use of anticoagulants: Secondary | ICD-10-CM

## 2014-01-16 DIAGNOSIS — Z5181 Encounter for therapeutic drug level monitoring: Secondary | ICD-10-CM

## 2014-01-16 DIAGNOSIS — I059 Rheumatic mitral valve disease, unspecified: Secondary | ICD-10-CM

## 2014-01-16 LAB — POCT INR: INR: 3.1

## 2014-02-13 ENCOUNTER — Ambulatory Visit (INDEPENDENT_AMBULATORY_CARE_PROVIDER_SITE_OTHER): Payer: Medicare Other

## 2014-02-13 DIAGNOSIS — Z5181 Encounter for therapeutic drug level monitoring: Secondary | ICD-10-CM

## 2014-02-13 DIAGNOSIS — I059 Rheumatic mitral valve disease, unspecified: Secondary | ICD-10-CM

## 2014-02-13 DIAGNOSIS — Z7901 Long term (current) use of anticoagulants: Secondary | ICD-10-CM

## 2014-02-13 LAB — POCT INR: INR: 2.6

## 2014-03-01 ENCOUNTER — Ambulatory Visit (INDEPENDENT_AMBULATORY_CARE_PROVIDER_SITE_OTHER): Payer: Medicare Other | Admitting: Cardiology

## 2014-03-01 ENCOUNTER — Encounter: Payer: Self-pay | Admitting: Cardiology

## 2014-03-01 VITALS — BP 117/68 | HR 70 | Ht 67.0 in | Wt 192.0 lb

## 2014-03-01 DIAGNOSIS — I059 Rheumatic mitral valve disease, unspecified: Secondary | ICD-10-CM

## 2014-03-01 DIAGNOSIS — Z952 Presence of prosthetic heart valve: Secondary | ICD-10-CM

## 2014-03-01 DIAGNOSIS — Z5181 Encounter for therapeutic drug level monitoring: Secondary | ICD-10-CM

## 2014-03-01 DIAGNOSIS — Z954 Presence of other heart-valve replacement: Secondary | ICD-10-CM

## 2014-03-01 DIAGNOSIS — I441 Atrioventricular block, second degree: Secondary | ICD-10-CM

## 2014-03-01 NOTE — Patient Instructions (Signed)
Continue your current therapy  I will see you in 6 months.   

## 2014-03-01 NOTE — Progress Notes (Signed)
Nathan Mcdaniel Date of Birth: July 07, 1941   History of Present Illness: Nathan Mcdaniel is seen for  followup today. He is s/p mechanical MVR in 2002. He has a history of PACs. Early this year he was noted to have marked ST-T wave changes on Ecg.Echo and Nuclear stress test were done with good results. He was also noted to have 2nd degree AV block and metoprolol dose was reduced. He denies any symptoms of chest pain, shortness of breath, or palpitations.His Coumadin has been therapeutic. He denies any bleeding. He's had no TIA or CVA symptoms. No palpitations and he has been exercising more.  Current Outpatient Prescriptions on File Prior to Visit  Medication Sig Dispense Refill  . Cholecalciferol 5000 UNITS capsule Take 5,000 Units by mouth daily.        . Cyanocobalamin (VITAMIN B-12 PO) Take 1 tablet by mouth daily.        Marland Kitchen LORazepam (ATIVAN) 0.5 MG tablet Take 0.5 mg by mouth every 8 (eight) hours as needed.       . metoprolol succinate (TOPROL-XL) 50 MG 24 hr tablet TAKE 1 TABLET DAILY  90 tablet  3  . Multiple Vitamin (MULTIVITAMIN) tablet Take 1 tablet by mouth daily.        . simvastatin (ZOCOR) 40 MG tablet Take 40 mg by mouth at bedtime.        . Tamsulosin HCl (FLOMAX) 0.4 MG CAPS Take 0.4 mg by mouth daily.        Marland Kitchen warfarin (COUMADIN) 3 MG tablet TAKE AS DIRECTED BY        ANTICOAGULATION CLINIC  110 tablet  1   No current facility-administered medications on file prior to visit.    No Known Allergies  Past Medical History  Diagnosis Date  . Hypertension   . Palpitations   . Mitral insufficiency   . Hypercholesterolemia   . Mobitz type 1 second degree atrioventricular block   . Abnormal ECG     Past Surgical History  Procedure Laterality Date  . Mitral valve replacement  2002    with a st.jude mechanical valve  . Cholecystectomy    . Colon surgery      with diversion and resection  . Prostate biopsy      History  Smoking status  . Former Smoker -- 0.80 packs/day  for 18 years  . Types: Cigarettes  . Quit date: 08/05/1979  Smokeless tobacco  . Never Used    History  Alcohol Use No    Family History  Problem Relation Age of Onset  . Heart failure Mother   . Hypertension Mother   . Lung cancer Sister     Review of Systems: As noted in history of present illness. All other systems were reviewed and are negative.  Physical Exam: BP 117/68  Pulse 70  Ht 5\' 7"  (1.702 m)  Wt 192 lb (87.091 kg)  BMI 30.06 kg/m2 He is a pleasant male in no acute distress. He is normocephalic, atraumatic. Pupils are equal round and reactive. Sclera clear. Oropharynx is clear. Neck is supple without JVD, adenopathy, thyromegaly, or bruits. Lungs are clear. Cardiac exam reveals an irregular  rhythm with a good mechanical mitral valve click. There is no gallop. Abdomen is soft and nontender. He has no masses or bruits. Pedal pulses are good. He has no edema. He is alert and oriented x3. Cranial nerves II through XII are intact.  LABORATORY DATA: INR last was 2.6  Cardiology Nuclear Med Study  Nathan Mcdaniel is a 73 y.o. male MRN : [id number] DOB: [birth year]  Procedure Date: 08/30/2013  Nuclear Med Background  Indication for Stress Test: Evaluation for Ischemia and Abnormal YFV:CBSWHQ degree AV,TWI  History: MVR, 01/15 ECHO: EF: 50-55%, 15 yrs ago MPI: [place] NL  Cardiac Risk Factors: Family History - CAD, History of Smoking, Hypertension and Lipids  Symptoms: DOE and Palpitations  Nuclear Pre-Procedure  Caffeine/Decaff Intake: 8:30pm  NPO After: 8:30pm   Lungs: clear  O2 Sat: 98% on room air.  IV 0.9% NS with Angio Cath: 22g   IV Site: R Hand  IV Started by: Matilde Haymaker, RN   Chest Size (in): 42  Cup Size: n/a   Height: 5\' 7"  (1.702 m)  Weight: 192 lb (87.091 kg)   BMI: Body mass index is 30.06 kg/(m^2).  Tech Comments: No Toprol x 36 hrs   Nuclear Med Study  1 or 2 day study: 1 day  Stress Test Type: Stress   Reading MD: n/a  Order Authorizing  Provider: Geanie Cooley   Resting Radionuclide: Technetium 57m Sestamibi  Resting Radionuclide Dose: 11.0 mCi   Stress Radionuclide: Technetium 72m Sestamibi  Stress Radionuclide Dose: 33.0 mCi   Stress Protocol  Rest HR: 74  Stress HR: 142   Rest BP: 121/81  Stress BP: 159/70   Exercise Time (min): 4:01  METS: 5.80   Predicted Max HR: 146 bpm  % Max HR: 97.26 bpm  Rate Pressure Product: 22578  Dose of Adenosine (mg): n/a  Dose of Lexiscan: n/a mg   Dose of Atropine (mg): n/a  Dose of Dobutamine: n/a mcg/kg/min (at max HR)   Stress Test Technologist: Perrin Maltese, EMT-P  Nuclear Technologist: Charlton Amor, CNMT   Rest Procedure: Myocardial perfusion imaging was performed at rest 45 minutes following the intravenous administration of Technetium 7m Sestamibi.  Rest ECG: SR, negative T waves in anterolateral and inferior leads  Stress Procedure: The patient exercised on the treadmill utilizing the Bruce Protocol for 4:01 minutes. The patient stopped due to sob, fatigue, and denied any chest pain. Technetium 47m Sestamibi was injected at peak exercise and myocardial perfusion imaging was performed after a brief delay.  Stress ECG: Frequent PACs and PVCs  QPS  Raw Data Images: There is diaphragmatic attenuation.  Stress Images: Normal homogeneous uptake in all areas of the myocardium.  Rest Images: Normal homogeneous uptake in all areas of the myocardium.  Subtraction (SDS): No evidence of ischemia.  Transient Ischemic Dilatation (Normal <1.22): 0.98  Lung/Heart Ratio (Normal <0.45): 0.29  Quantitative Gated Spect Images  QGS EDV: 140 ml  QGS ESV: 72 ml  Impression  Exercise Capacity: Fair exercise capacity.  BP Response: Normal blood pressure response.  Clinical Symptoms: Dyspnea, fatigue  ECG Impression: No significant ST segment change suggestive of ischemia.  Comparison with Prior Nuclear Study: No previous study  Overall Impression: Normal stress nuclear study.  LV  Ejection Fraction: 49%. LV Wall Motion: Paradoxican septal motion, otherwise no regional wall motion abnormalities, low normal LVEF.  Ena Dawley, H  08/30/2013   Echo: Study Conclusions  - Left ventricle: The cavity size was mildly dilated. Wall thickness was increased in a pattern of moderate LVH. Systolic function was normal. The estimated ejection fraction was in the range of 50% to 55%. - Aortic valve: Trivial regurgitation. - Mitral valve: Normal appearing mechanical MVR with no peri valvular leak - Left atrium: The atrium was moderately to severely dilated. - Right atrium: The atrium was mildly dilated. -  Atrial septum: No defect or patent foramen ovale was identified.    Assessment / Plan: 1. Status post mechanical mitral valve replacement in 2002. Good valve click on exam. Patient is therapeutic on his Coumadin. We will continue with his current therapy.  2. Chronic anticoagulation.   3. PACs. Minimally symptomatic. Continue beta blocker therapy.  4. Mobitz type 1 second degree AV block.  Asymptomatic. HR satisfactory with reduction in metoprolol dose.  5. Abnormal Ecg. Marked anterolateral T wave inversion. No significant abnormality on Echo or Nuclear stress test.  I requested a copy of recent labs with Dr. Melford Aase. Follow up 6 months.

## 2014-03-09 ENCOUNTER — Other Ambulatory Visit: Payer: Self-pay | Admitting: Cardiology

## 2014-03-27 ENCOUNTER — Ambulatory Visit (INDEPENDENT_AMBULATORY_CARE_PROVIDER_SITE_OTHER): Payer: Medicare Other | Admitting: Pharmacist

## 2014-03-27 DIAGNOSIS — Z5181 Encounter for therapeutic drug level monitoring: Secondary | ICD-10-CM

## 2014-03-27 DIAGNOSIS — Z7901 Long term (current) use of anticoagulants: Secondary | ICD-10-CM

## 2014-03-27 DIAGNOSIS — I059 Rheumatic mitral valve disease, unspecified: Secondary | ICD-10-CM

## 2014-03-27 LAB — POCT INR: INR: 3.1

## 2014-04-25 ENCOUNTER — Other Ambulatory Visit: Payer: Self-pay

## 2014-04-25 MED ORDER — METOPROLOL SUCCINATE ER 50 MG PO TB24: ORAL_TABLET | ORAL | Status: DC

## 2014-04-26 ENCOUNTER — Telehealth: Payer: Self-pay | Admitting: *Deleted

## 2014-04-26 NOTE — Telephone Encounter (Signed)
Pt called stating that he had been ordered Prednisone 10 mg tabs take 6 today then 5tabs then 4tabs then  3tbs 2tabs and 1 tab .ordered for pain in left shoulder. Appt made for recheck  INR  on Friday Sept 25th and he states understanding.

## 2014-04-27 ENCOUNTER — Telehealth: Payer: Self-pay | Admitting: Cardiology

## 2014-04-27 NOTE — Telephone Encounter (Signed)
Nathan Mcdaniel is calling in to get a verbal request for Toprol XL 50mg . Please call  thanks

## 2014-04-27 NOTE — Telephone Encounter (Signed)
Spoke with pharmacist, pt does not want his script from [facility] he wants them to handle. Verbal order for metoprolol given

## 2014-04-28 ENCOUNTER — Ambulatory Visit (INDEPENDENT_AMBULATORY_CARE_PROVIDER_SITE_OTHER): Payer: Medicare Other

## 2014-04-28 DIAGNOSIS — I059 Rheumatic mitral valve disease, unspecified: Secondary | ICD-10-CM

## 2014-04-28 DIAGNOSIS — Z5181 Encounter for therapeutic drug level monitoring: Secondary | ICD-10-CM

## 2014-04-28 DIAGNOSIS — Z7901 Long term (current) use of anticoagulants: Secondary | ICD-10-CM

## 2014-04-28 LAB — POCT INR: INR: 3.3

## 2014-05-23 ENCOUNTER — Other Ambulatory Visit: Payer: Self-pay | Admitting: Orthopedic Surgery

## 2014-05-23 DIAGNOSIS — M25511 Pain in right shoulder: Secondary | ICD-10-CM

## 2014-05-24 ENCOUNTER — Ambulatory Visit
Admission: RE | Admit: 2014-05-24 | Discharge: 2014-05-24 | Disposition: A | Discharge status: Home / Self Care | Payer: Medicare Other | Source: Ambulatory Visit | Attending: Orthopedic Surgery | Admitting: Orthopedic Surgery

## 2014-05-24 DIAGNOSIS — M25511 Pain in right shoulder: Secondary | ICD-10-CM

## 2014-06-01 ENCOUNTER — Telehealth: Payer: Self-pay

## 2014-06-01 NOTE — Telephone Encounter (Signed)
Received surgical clearance from [facility] [facility].Dr.Jordan cleared patient for surgery.Form faxed back to fax # [phone].

## 2014-06-02 ENCOUNTER — Telehealth: Payer: Self-pay | Admitting: Cardiology

## 2014-06-02 NOTE — Telephone Encounter (Signed)
Pt's surgery is scheduled for 11/9.  He is aware that his [facility] appt has been moved to 11/2 to give him his Lovenox dosing instructions.

## 2014-06-02 NOTE — Telephone Encounter (Signed)
Left message for sherri, patient has a mechanical mitral valve, he will need lovenox bridging. Made her aware the patient has a [facility] clinic appt 06-09-14. [facility] clinic number given if she has questions Forwarded to [facility]

## 2014-06-02 NOTE — Telephone Encounter (Signed)
New message     Need clearance to stop coumadin prior to surgery.  They got the clearance to have surgery but it did not have anything about stopping coumadin

## 2014-06-05 ENCOUNTER — Ambulatory Visit (INDEPENDENT_AMBULATORY_CARE_PROVIDER_SITE_OTHER): Payer: Medicare Other | Admitting: *Deleted

## 2014-06-05 DIAGNOSIS — Z7901 Long term (current) use of anticoagulants: Secondary | ICD-10-CM

## 2014-06-05 DIAGNOSIS — Z5181 Encounter for therapeutic drug level monitoring: Secondary | ICD-10-CM

## 2014-06-05 DIAGNOSIS — I059 Rheumatic mitral valve disease, unspecified: Secondary | ICD-10-CM

## 2014-06-05 LAB — POCT INR: INR: 2.3

## 2014-06-05 MED ORDER — ENOXAPARIN SODIUM 80 MG/0.8ML ~~LOC~~ SOLN: 80.0000 mg | Freq: Two times a day (BID) | SUBCUTANEOUS | Status: DC

## 2014-06-05 NOTE — Patient Instructions (Signed)
06/05/14-Take normal 3mg s dose of Coumadin  06/06/14- Take last dose of 3mg s of Coumadin  06/07/14- Do Nothing  06/08/14- Start Lovenox 80mg s injections subcutaneously into abdomen 2 inches away from navel rotate sites at 8AM and 8PM  06/09/14- Continue Lovenox 80mg s injections subcutaneously into abdomen 2 inches away from navel rotate sites at 8AM and 8PM  06/10/14- Continue Lovenox 80mg s injections subcutaneously into abdomen 2 inches away from navel rotate sites at 8AM and 8PM  06/11/14- Take last Lovenox 80mg s injections subcutaneously into abdomen 2 inches away from navel  8AM   06/12/14- Report for surgery. Restart Coumadin and Lovenox after surgery when ok with surgeon. When you restart Coumadin take an extra 1/2 tablet along with your normal dosage for 2 days, then resume normal dose. Restart Lovenox 80mg s injections every 12 hours. Take Both Coumadin and Lovenox after surgery until Coumadin Clinic appt.  On 06/16/14.

## 2014-06-16 ENCOUNTER — Ambulatory Visit (INDEPENDENT_AMBULATORY_CARE_PROVIDER_SITE_OTHER): Payer: Medicare Other | Admitting: Pharmacist

## 2014-06-16 DIAGNOSIS — Z7901 Long term (current) use of anticoagulants: Secondary | ICD-10-CM

## 2014-06-16 DIAGNOSIS — Z5181 Encounter for therapeutic drug level monitoring: Secondary | ICD-10-CM

## 2014-06-16 DIAGNOSIS — I059 Rheumatic mitral valve disease, unspecified: Secondary | ICD-10-CM

## 2014-06-16 LAB — POCT INR: INR: 1.7

## 2014-06-23 ENCOUNTER — Ambulatory Visit (INDEPENDENT_AMBULATORY_CARE_PROVIDER_SITE_OTHER): Payer: Medicare Other

## 2014-06-23 DIAGNOSIS — I059 Rheumatic mitral valve disease, unspecified: Secondary | ICD-10-CM

## 2014-06-23 DIAGNOSIS — Z5181 Encounter for therapeutic drug level monitoring: Secondary | ICD-10-CM

## 2014-06-23 DIAGNOSIS — Z7901 Long term (current) use of anticoagulants: Secondary | ICD-10-CM

## 2014-06-23 LAB — POCT INR: INR: 3

## 2014-08-09 ENCOUNTER — Ambulatory Visit (INDEPENDENT_AMBULATORY_CARE_PROVIDER_SITE_OTHER): Payer: Medicare Other | Admitting: *Deleted

## 2014-08-09 DIAGNOSIS — Z5181 Encounter for therapeutic drug level monitoring: Secondary | ICD-10-CM

## 2014-08-09 DIAGNOSIS — I059 Rheumatic mitral valve disease, unspecified: Secondary | ICD-10-CM

## 2014-08-09 DIAGNOSIS — Z7901 Long term (current) use of anticoagulants: Secondary | ICD-10-CM

## 2014-08-09 LAB — POCT INR: INR: 2.9

## 2014-08-22 ENCOUNTER — Telehealth: Payer: Self-pay | Admitting: Cardiology

## 2014-08-22 NOTE — Telephone Encounter (Signed)
Spoke with pt.  He started a 12-day prednisone taper today with 30mg .  INR was therapeutic 2 weeks ago.  Will have him come into the clinic in 3 days to check and INR and adjust dose if necessary.

## 2014-08-22 NOTE — Telephone Encounter (Signed)
New message      Pt is on coumadin.  He started predisone today--5mg .  Will he need his coumadin doasage adjusted?

## 2014-08-24 ENCOUNTER — Ambulatory Visit (INDEPENDENT_AMBULATORY_CARE_PROVIDER_SITE_OTHER): Payer: Medicare Other | Admitting: *Deleted

## 2014-08-24 DIAGNOSIS — Z5181 Encounter for therapeutic drug level monitoring: Secondary | ICD-10-CM

## 2014-08-24 DIAGNOSIS — I059 Rheumatic mitral valve disease, unspecified: Secondary | ICD-10-CM

## 2014-08-24 DIAGNOSIS — Z7901 Long term (current) use of anticoagulants: Secondary | ICD-10-CM

## 2014-08-24 LAB — POCT INR: INR: 2.6

## 2014-08-29 ENCOUNTER — Ambulatory Visit (INDEPENDENT_AMBULATORY_CARE_PROVIDER_SITE_OTHER): Payer: Medicare Other | Admitting: *Deleted

## 2014-08-29 DIAGNOSIS — I059 Rheumatic mitral valve disease, unspecified: Secondary | ICD-10-CM

## 2014-08-29 DIAGNOSIS — Z7901 Long term (current) use of anticoagulants: Secondary | ICD-10-CM

## 2014-08-29 DIAGNOSIS — Z5181 Encounter for therapeutic drug level monitoring: Secondary | ICD-10-CM

## 2014-08-29 LAB — POCT INR: INR: 3.6

## 2014-08-30 ENCOUNTER — Ambulatory Visit (INDEPENDENT_AMBULATORY_CARE_PROVIDER_SITE_OTHER): Payer: Medicare Other | Admitting: Cardiology

## 2014-08-30 ENCOUNTER — Encounter: Payer: Self-pay | Admitting: Cardiology

## 2014-08-30 VITALS — BP 134/62 | HR 66 | Ht 67.0 in | Wt 186.7 lb

## 2014-08-30 DIAGNOSIS — E7800 Pure hypercholesterolemia, unspecified: Secondary | ICD-10-CM

## 2014-08-30 DIAGNOSIS — Z952 Presence of prosthetic heart valve: Secondary | ICD-10-CM

## 2014-08-30 DIAGNOSIS — Z954 Presence of other heart-valve replacement: Secondary | ICD-10-CM

## 2014-08-30 DIAGNOSIS — I10 Essential (primary) hypertension: Secondary | ICD-10-CM

## 2014-08-30 MED ORDER — METOPROLOL SUCCINATE ER 25 MG PO TB24: 25.0000 mg | ORAL_TABLET | Freq: Every day | ORAL | Status: DC

## 2014-08-30 NOTE — Progress Notes (Signed)
Nathan Mcdaniel Date of Birth: 02-Jun-1941   History of Present Illness: Nathan Mcdaniel is seen for  followup MV disease. He is s/p mechanical MVR in 2002. He has a history of PACs. last year he was noted to have marked ST-T wave changes on Ecg.Echo and Nuclear stress test were done with good results. He was also noted to have 2nd degree AV block and metoprolol dose was reduced. He denies any symptoms of chest pain, shortness of breath, or palpitations.His Coumadin has been therapeutic. He denies any bleeding. He's had no TIA or CVA symptoms. No palpitations. He generally feels very well and stays active.   Current Outpatient Prescriptions on File Prior to Visit  Medication Sig Dispense Refill  . Cholecalciferol 5000 UNITS capsule Take 5,000 Units by mouth daily.      . Cyanocobalamin (VITAMIN B-12 PO) Take 1 tablet by mouth daily.      Marland Kitchen LORazepam (ATIVAN) 0.5 MG tablet Take 0.5 mg by mouth every 8 (eight) hours as needed.     . simvastatin (ZOCOR) 40 MG tablet Take 40 mg by mouth at bedtime.      . Tamsulosin HCl (FLOMAX) 0.4 MG CAPS Take 0.4 mg by mouth daily.      Marland Kitchen warfarin (COUMADIN) 3 MG tablet TAKE AS DIRECTED BY        ANTICOAGULATION CLINIC 110 tablet 1   No current facility-administered medications on file prior to visit.    No Known Allergies  Past Medical History  Diagnosis Date  . Hypertension   . Palpitations   . Mitral insufficiency   . Hypercholesterolemia   . Mobitz type 1 second degree atrioventricular block   . Abnormal ECG     Past Surgical History  Procedure Laterality Date  . Mitral valve replacement  2002    with a st.jude mechanical valve  . Cholecystectomy    . Colon surgery      with diversion and resection  . Prostate biopsy      History  Smoking status  . Former Smoker -- 0.80 packs/day for 18 years  . Types: Cigarettes  . Quit date: 08/05/1979  Smokeless tobacco  . Never Used    History  Alcohol Use No    Family History  Problem Relation  Age of Onset  . Heart failure Mother   . Hypertension Mother   . Lung cancer Sister     Review of Systems: As noted in history of present illness. He had rotator cuff surgery in November. he is currently on prednisone for back pain. All other systems were reviewed and are negative.  Physical Exam: BP 134/62 mmHg  Pulse 66  Ht 5\' 7"  (1.702 m)  Wt 186 lb 11.2 oz (84.687 kg)  BMI 29.23 kg/m2 He is a pleasant male in no acute distress. He is normocephalic, atraumatic. Pupils are equal round and reactive. Sclera clear. Oropharynx is clear. Neck is supple without JVD, adenopathy, thyromegaly, or bruits. Lungs are clear. Cardiac exam reveals an irregular  rhythm with a good mechanical mitral valve click. There is no gallop. Abdomen is soft and nontender. He has no masses or bruits. Pedal pulses are good. He has no edema. He is alert and oriented x3. Cranial nerves II through XII are intact.  LABORATORY DATA: INR  was 3.6  Ecg shows NSR with PACs, nonspecific ST abnormality.   Assessment / Plan: 1. Status post mechanical mitral valve replacement in 2002. Good valve click on exam. Patient is therapeutic on his  Coumadin. We will continue with his current therapy.  2. Chronic anticoagulation.   3. PACs. asymptomatic. Continue beta blocker therapy.  4. Mobitz type 1 second degree AV block.  Resolved with reduction in beta blocker dose.  5. Abnormal Ecg. Marked anterolateral T wave inversion now improved. Work up with Myoview and Echo unremarkable.    Follow up 6 months.

## 2014-08-30 NOTE — Patient Instructions (Signed)
Continue your current therapy  I will see you in 6 months.   

## 2014-09-05 ENCOUNTER — Ambulatory Visit (INDEPENDENT_AMBULATORY_CARE_PROVIDER_SITE_OTHER): Payer: Medicare Other | Admitting: *Deleted

## 2014-09-05 DIAGNOSIS — Z7901 Long term (current) use of anticoagulants: Secondary | ICD-10-CM

## 2014-09-05 DIAGNOSIS — I059 Rheumatic mitral valve disease, unspecified: Secondary | ICD-10-CM

## 2014-09-05 DIAGNOSIS — Z5181 Encounter for therapeutic drug level monitoring: Secondary | ICD-10-CM

## 2014-09-05 LAB — POCT INR: INR: 3.4

## 2014-10-11 ENCOUNTER — Other Ambulatory Visit: Payer: Self-pay | Admitting: Pharmacist Clinician (PhC)/ Clinical Pharmacy Specialist

## 2014-10-11 MED ORDER — WARFARIN SODIUM 3 MG PO TABS: ORAL_TABLET | ORAL | Status: DC

## 2014-10-16 ENCOUNTER — Telehealth: Payer: Self-pay | Admitting: Cardiology

## 2014-10-16 NOTE — Telephone Encounter (Signed)
New Msg      Pt has began a Prednisone regimen as of today.    Does he still need to come in tomorrow?   Please return call.

## 2014-10-16 NOTE — Telephone Encounter (Signed)
Returned call to pt, starting Prednisone taper 5mg  tablets, 6 tabs today, 5 tabs tomorrow, 4-3-2-1 then stop.  Rescheduled f/u appt for 10/17/14 to 10/19/14.  Advised pt not to self adjust his dosage of Coumadin prior to checking INR first.  Pt verbalized understanding.

## 2014-10-19 ENCOUNTER — Ambulatory Visit (INDEPENDENT_AMBULATORY_CARE_PROVIDER_SITE_OTHER): Payer: Medicare Other | Admitting: *Deleted

## 2014-10-19 DIAGNOSIS — I059 Rheumatic mitral valve disease, unspecified: Secondary | ICD-10-CM

## 2014-10-19 DIAGNOSIS — Z7901 Long term (current) use of anticoagulants: Secondary | ICD-10-CM | POA: Diagnosis not present

## 2014-10-19 DIAGNOSIS — Z5181 Encounter for therapeutic drug level monitoring: Secondary | ICD-10-CM

## 2014-10-19 LAB — POCT INR: INR: 4

## 2014-10-24 ENCOUNTER — Ambulatory Visit (INDEPENDENT_AMBULATORY_CARE_PROVIDER_SITE_OTHER): Payer: Medicare Other | Admitting: *Deleted

## 2014-10-24 DIAGNOSIS — Z7901 Long term (current) use of anticoagulants: Secondary | ICD-10-CM

## 2014-10-24 DIAGNOSIS — Z5181 Encounter for therapeutic drug level monitoring: Secondary | ICD-10-CM | POA: Diagnosis not present

## 2014-10-24 DIAGNOSIS — I059 Rheumatic mitral valve disease, unspecified: Secondary | ICD-10-CM | POA: Diagnosis not present

## 2014-10-24 LAB — POCT INR: INR: 3.1

## 2014-11-21 ENCOUNTER — Ambulatory Visit (INDEPENDENT_AMBULATORY_CARE_PROVIDER_SITE_OTHER): Payer: Medicare Other | Admitting: *Deleted

## 2014-11-21 DIAGNOSIS — Z7901 Long term (current) use of anticoagulants: Secondary | ICD-10-CM | POA: Diagnosis not present

## 2014-11-21 DIAGNOSIS — Z5181 Encounter for therapeutic drug level monitoring: Secondary | ICD-10-CM

## 2014-11-21 DIAGNOSIS — I059 Rheumatic mitral valve disease, unspecified: Secondary | ICD-10-CM

## 2014-11-21 LAB — POCT INR: INR: 3.3

## 2015-01-02 ENCOUNTER — Ambulatory Visit (INDEPENDENT_AMBULATORY_CARE_PROVIDER_SITE_OTHER): Payer: Medicare Other | Admitting: *Deleted

## 2015-01-02 DIAGNOSIS — Z5181 Encounter for therapeutic drug level monitoring: Secondary | ICD-10-CM

## 2015-01-02 DIAGNOSIS — Z7901 Long term (current) use of anticoagulants: Secondary | ICD-10-CM | POA: Diagnosis not present

## 2015-01-02 DIAGNOSIS — I059 Rheumatic mitral valve disease, unspecified: Secondary | ICD-10-CM

## 2015-01-02 LAB — POCT INR: INR: 3.3

## 2015-02-14 ENCOUNTER — Ambulatory Visit (INDEPENDENT_AMBULATORY_CARE_PROVIDER_SITE_OTHER): Payer: Medicare Other | Admitting: Pharmacist

## 2015-02-14 DIAGNOSIS — I059 Rheumatic mitral valve disease, unspecified: Secondary | ICD-10-CM

## 2015-02-14 DIAGNOSIS — Z7901 Long term (current) use of anticoagulants: Secondary | ICD-10-CM | POA: Diagnosis not present

## 2015-02-14 DIAGNOSIS — Z5181 Encounter for therapeutic drug level monitoring: Secondary | ICD-10-CM

## 2015-02-14 LAB — POCT INR: INR: 3.7

## 2015-03-15 ENCOUNTER — Other Ambulatory Visit: Payer: Self-pay | Admitting: Pharmacist Clinician (PhC)/ Clinical Pharmacy Specialist

## 2015-03-15 MED ORDER — WARFARIN SODIUM 3 MG PO TABS: ORAL_TABLET | ORAL | Status: DC

## 2015-03-27 ENCOUNTER — Encounter: Payer: Self-pay | Admitting: Cardiology

## 2015-03-27 ENCOUNTER — Ambulatory Visit (INDEPENDENT_AMBULATORY_CARE_PROVIDER_SITE_OTHER): Payer: Medicare Other | Admitting: Cardiology

## 2015-03-27 ENCOUNTER — Ambulatory Visit (INDEPENDENT_AMBULATORY_CARE_PROVIDER_SITE_OTHER): Payer: Medicare Other

## 2015-03-27 VITALS — BP 120/62 | HR 70 | Ht 67.0 in | Wt 181.5 lb

## 2015-03-27 DIAGNOSIS — Z5181 Encounter for therapeutic drug level monitoring: Secondary | ICD-10-CM

## 2015-03-27 DIAGNOSIS — Z954 Presence of other heart-valve replacement: Secondary | ICD-10-CM

## 2015-03-27 DIAGNOSIS — Z7901 Long term (current) use of anticoagulants: Secondary | ICD-10-CM | POA: Diagnosis not present

## 2015-03-27 DIAGNOSIS — I059 Rheumatic mitral valve disease, unspecified: Secondary | ICD-10-CM | POA: Diagnosis not present

## 2015-03-27 DIAGNOSIS — I340 Nonrheumatic mitral (valve) insufficiency: Secondary | ICD-10-CM

## 2015-03-27 DIAGNOSIS — I441 Atrioventricular block, second degree: Secondary | ICD-10-CM

## 2015-03-27 DIAGNOSIS — Z952 Presence of prosthetic heart valve: Secondary | ICD-10-CM

## 2015-03-27 LAB — POCT INR: INR: 4.1

## 2015-03-27 NOTE — Progress Notes (Signed)
Nathan Mcdaniel Date of Birth: 01/30/1941   History of Present Illness: Nathan Mcdaniel is seen for  followup MV disease. He is s/p mechanical MVR in 2002. He has a history of PACs. In January 2015 he was noted to have marked ST-T wave changes on Ecg.Echo and Nuclear stress test were done with good results. He was also noted to have 2nd degree AV block and metoprolol dose was reduced.  On follow up he is doing very well. He denies any symptoms of chest pain, shortness of breath, or palpitations.His Coumadin has been therapeutic. He denies any bleeding. He's had no TIA or CVA symptoms. No palpitations. 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   Current Outpatient Prescriptions on File Prior to Visit  Medication Sig Dispense Refill  . Cholecalciferol 5000 UNITS capsule Take 5,000 Units by mouth daily.      . Cyanocobalamin (VITAMIN B-12 PO) Take 1 tablet by mouth daily.      Marland Kitchen LORazepam (ATIVAN) 0.5 MG tablet Take 0.5 mg by mouth every 8 (eight) hours as needed.     . metoprolol succinate (TOPROL XL) 25 MG 24 hr tablet Take 1 tablet (25 mg total) by mouth daily. 90 tablet 3  . sertraline (ZOLOFT) 50 MG tablet TAKE 1 TABLET EVERY DAY    . Tamsulosin HCl (FLOMAX) 0.4 MG CAPS Take 0.4 mg by mouth daily.      Marland Kitchen warfarin (COUMADIN) 3 MG tablet Take 1 tablet by mouth dailyors directed by coumadin clinic 90 tablet 0   No current facility-administered medications on file prior to visit.    No Known Allergies  Past Medical History  Diagnosis Date  . Hypertension   . Palpitations   . Mitral insufficiency   . Hypercholesterolemia   . Mobitz type 1 second degree atrioventricular block   . Abnormal ECG     Past Surgical History  Procedure Laterality Date  . Mitral valve replacement  2002    with a st.jude mechanical valve  . Cholecystectomy    . Colon surgery      with diversion and resection  . Prostate biopsy      History  Smoking status  . Former Smoker -- 0.80 packs/day  for 18 years  . Types: Cigarettes  . Quit date: 08/05/1979  Smokeless tobacco  . Never Used    History  Alcohol Use No    Family History  Problem Relation Age of Onset  . Heart failure Mother   . Hypertension Mother   . Lung cancer Sister     Review of Systems: As noted in history of present illness.  All other systems were reviewed and are negative.  Physical Exam: BP 120/62 mmHg  Pulse 70  Ht 5\' 7"  (1.702 m)  Wt 82.328 kg (181 lb 8 oz)  BMI 28.42 kg/m2 He is a pleasant male in no acute distress. He is normocephalic, atraumatic. Pupils are equal round and reactive. Sclera clear. Oropharynx is clear. Neck is supple without JVD, adenopathy, thyromegaly, or bruits. Lungs are clear. Cardiac exam reveals an irregular  rhythm with a good mechanical mitral valve click. There is no gallop. Abdomen is soft and nontender. He has no masses or bruits. Pedal pulses are good. He has no edema. He is alert and oriented x3. Cranial nerves II through XII are intact.  LABORATORY DATA: INR  was 4.1     Assessment / Plan: 1. Status post mechanical mitral valve replacement in 2002. Good valve click  on exam. Patient is therapeutic on his Coumadin. We will continue with his current therapy.  2. Chronic anticoagulation. Adjusted per pharmacy.  3. PACs. asymptomatic. Continue beta blocker therapy.  4. Mobitz type 1 second degree AV block.  Resolved with reduction in beta blocker dose.  5. Abnormal Ecg.  Work up with Myoview and Echo unremarkable.    Follow up in one year

## 2015-03-27 NOTE — Patient Instructions (Signed)
Continue your current therapy  I will see you in one year   

## 2015-04-13 ENCOUNTER — Ambulatory Visit (INDEPENDENT_AMBULATORY_CARE_PROVIDER_SITE_OTHER): Payer: Medicare Other | Admitting: *Deleted

## 2015-04-13 DIAGNOSIS — I059 Rheumatic mitral valve disease, unspecified: Secondary | ICD-10-CM

## 2015-04-13 DIAGNOSIS — Z7901 Long term (current) use of anticoagulants: Secondary | ICD-10-CM

## 2015-04-13 DIAGNOSIS — Z5181 Encounter for therapeutic drug level monitoring: Secondary | ICD-10-CM

## 2015-04-13 LAB — POCT INR: INR: 2.9

## 2015-05-25 ENCOUNTER — Ambulatory Visit (INDEPENDENT_AMBULATORY_CARE_PROVIDER_SITE_OTHER): Payer: Medicare Other | Admitting: *Deleted

## 2015-05-25 DIAGNOSIS — Z5181 Encounter for therapeutic drug level monitoring: Secondary | ICD-10-CM | POA: Diagnosis not present

## 2015-05-25 DIAGNOSIS — I059 Rheumatic mitral valve disease, unspecified: Secondary | ICD-10-CM | POA: Diagnosis not present

## 2015-05-25 DIAGNOSIS — Z7901 Long term (current) use of anticoagulants: Secondary | ICD-10-CM | POA: Diagnosis not present

## 2015-05-25 LAB — POCT INR: INR: 3.5

## 2015-06-26 ENCOUNTER — Other Ambulatory Visit: Payer: Self-pay | Admitting: Cardiology

## 2015-06-26 MED ORDER — WARFARIN SODIUM 3 MG PO TABS: ORAL_TABLET | ORAL | Status: DC

## 2015-06-26 NOTE — Telephone Encounter (Signed)
Pt calling for a refill on Warfarin 3 mg tablet. Please advise

## 2015-06-26 NOTE — Telephone Encounter (Signed)
Refill done as requested 

## 2015-07-06 ENCOUNTER — Ambulatory Visit (INDEPENDENT_AMBULATORY_CARE_PROVIDER_SITE_OTHER): Payer: Medicare Other | Admitting: *Deleted

## 2015-07-06 DIAGNOSIS — Z5181 Encounter for therapeutic drug level monitoring: Secondary | ICD-10-CM | POA: Diagnosis not present

## 2015-07-06 DIAGNOSIS — I059 Rheumatic mitral valve disease, unspecified: Secondary | ICD-10-CM | POA: Diagnosis not present

## 2015-07-06 DIAGNOSIS — Z7901 Long term (current) use of anticoagulants: Secondary | ICD-10-CM | POA: Diagnosis not present

## 2015-07-06 LAB — POCT INR: INR: 2.5

## 2015-08-17 ENCOUNTER — Other Ambulatory Visit: Payer: Self-pay

## 2015-08-17 ENCOUNTER — Ambulatory Visit (INDEPENDENT_AMBULATORY_CARE_PROVIDER_SITE_OTHER): Payer: Medicare Other | Admitting: Pharmacist

## 2015-08-17 DIAGNOSIS — Z5181 Encounter for therapeutic drug level monitoring: Secondary | ICD-10-CM | POA: Diagnosis not present

## 2015-08-17 DIAGNOSIS — I059 Rheumatic mitral valve disease, unspecified: Secondary | ICD-10-CM | POA: Diagnosis not present

## 2015-08-17 DIAGNOSIS — Z7901 Long term (current) use of anticoagulants: Secondary | ICD-10-CM | POA: Diagnosis not present

## 2015-08-17 LAB — POCT INR: INR: 2.2

## 2015-08-17 MED ORDER — METOPROLOL SUCCINATE ER 25 MG PO TB24: 25.0000 mg | ORAL_TABLET | Freq: Every day | ORAL | Status: DC

## 2015-08-17 NOTE — Telephone Encounter (Signed)
Rx(s) sent to pharmacy electronically.  

## 2015-09-27 ENCOUNTER — Other Ambulatory Visit: Payer: Self-pay | Admitting: Cardiology

## 2015-10-05 ENCOUNTER — Ambulatory Visit (INDEPENDENT_AMBULATORY_CARE_PROVIDER_SITE_OTHER): Payer: Medicare Other | Admitting: *Deleted

## 2015-10-05 DIAGNOSIS — Z5181 Encounter for therapeutic drug level monitoring: Secondary | ICD-10-CM

## 2015-10-05 DIAGNOSIS — I059 Rheumatic mitral valve disease, unspecified: Secondary | ICD-10-CM

## 2015-10-05 DIAGNOSIS — Z7901 Long term (current) use of anticoagulants: Secondary | ICD-10-CM | POA: Diagnosis not present

## 2015-10-05 LAB — POCT INR: INR: 3.6

## 2015-11-16 ENCOUNTER — Ambulatory Visit (INDEPENDENT_AMBULATORY_CARE_PROVIDER_SITE_OTHER): Payer: Medicare Other

## 2015-11-16 DIAGNOSIS — Z5181 Encounter for therapeutic drug level monitoring: Secondary | ICD-10-CM | POA: Diagnosis not present

## 2015-11-16 DIAGNOSIS — I059 Rheumatic mitral valve disease, unspecified: Secondary | ICD-10-CM

## 2015-11-16 DIAGNOSIS — Z7901 Long term (current) use of anticoagulants: Secondary | ICD-10-CM | POA: Diagnosis not present

## 2015-11-16 LAB — POCT INR: INR: 2.9

## 2015-12-28 ENCOUNTER — Ambulatory Visit (INDEPENDENT_AMBULATORY_CARE_PROVIDER_SITE_OTHER): Payer: Medicare Other | Admitting: *Deleted

## 2015-12-28 DIAGNOSIS — Z7901 Long term (current) use of anticoagulants: Secondary | ICD-10-CM

## 2015-12-28 DIAGNOSIS — Z5181 Encounter for therapeutic drug level monitoring: Secondary | ICD-10-CM

## 2015-12-28 DIAGNOSIS — I059 Rheumatic mitral valve disease, unspecified: Secondary | ICD-10-CM

## 2015-12-28 LAB — POCT INR: INR: 3.5

## 2016-01-21 IMAGING — MR MR SHOULDER*R* W/O CM
5 series · 33 of 40 positions shown · non-contrast
Comparison: None.

CLINICAL DATA: Right shoulder pain since 5665. Worse over last 3
weeks

EXAM:
MRI OF THE RIGHT SHOULDER WITHOUT CONTRAST
TECHNIQUE: Multiplanar, multisequence MR imaging of the shoulder was performed.
No intravenous contrast was administered.

[Series 3: T2 fat-sat · axial · 4.0mm · 0.55mm/px · z∈[-17,+71]mm · 8 of 20 slices shown (1 of 3)]
[im 1/20]
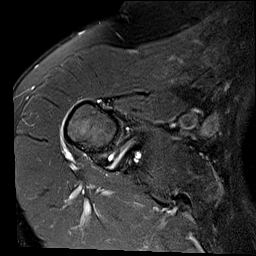
[im 3/20]
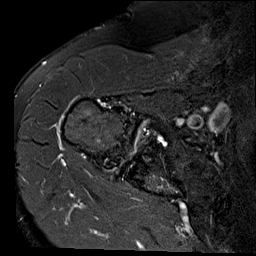
[im 6/20]
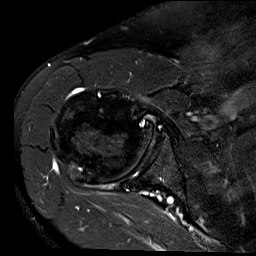
[im 9/20]
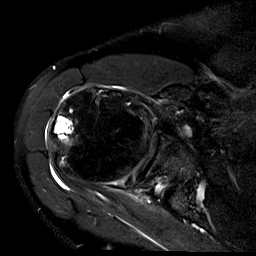
[im 11/20]
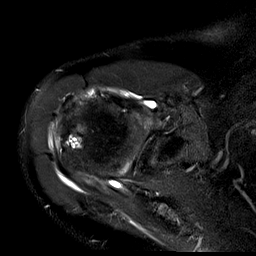
[im 14/20]
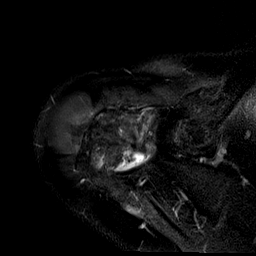
[im 17/20]
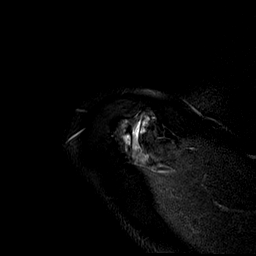
[im 20/20]
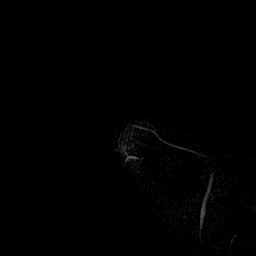

[Series 5: T2 fat-sat · oblique · 4.0mm · 0.27mm/px · 7 of 17 slices shown (2 of 3)]
[im 1/17]
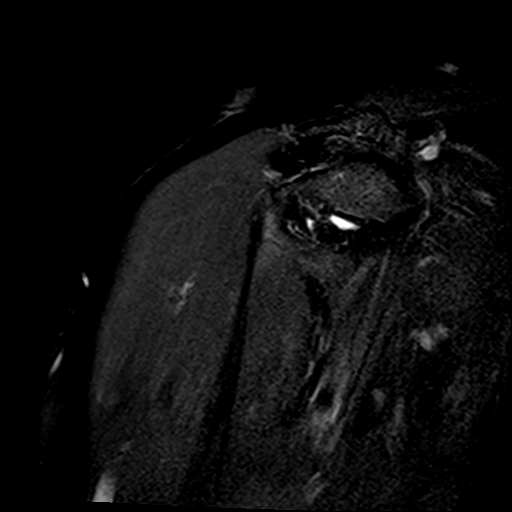
[im 3/17]
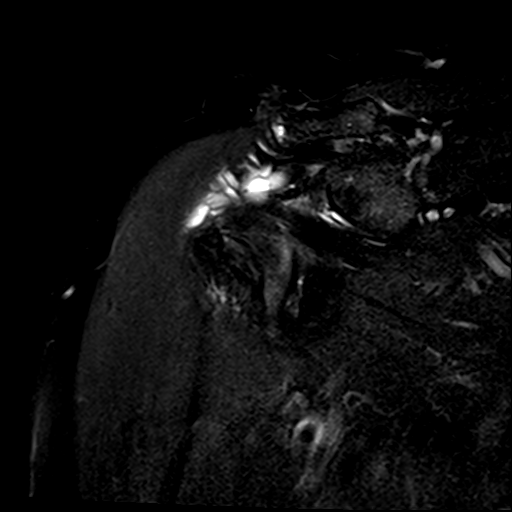
[im 6/17]
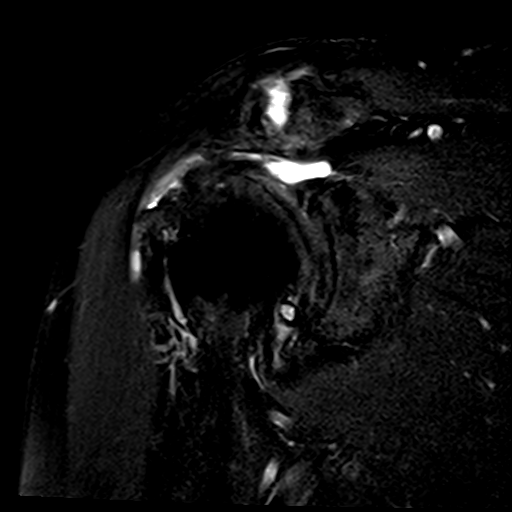
[im 9/17]
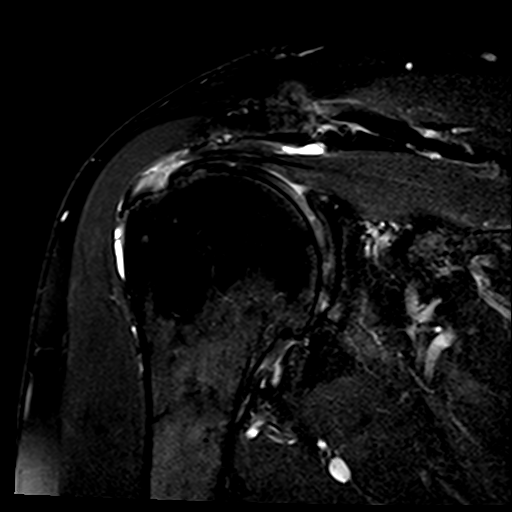
[im 11/17]
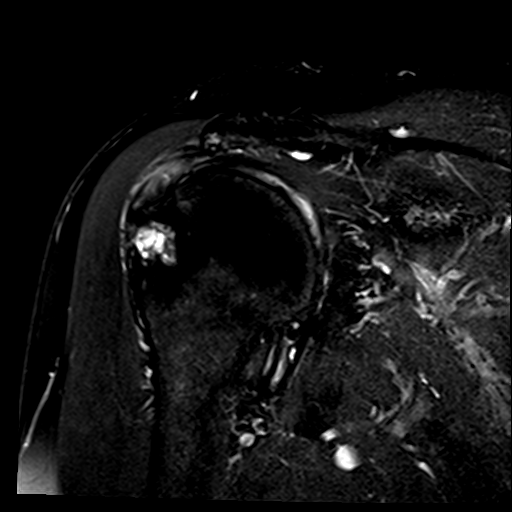
[im 14/17]
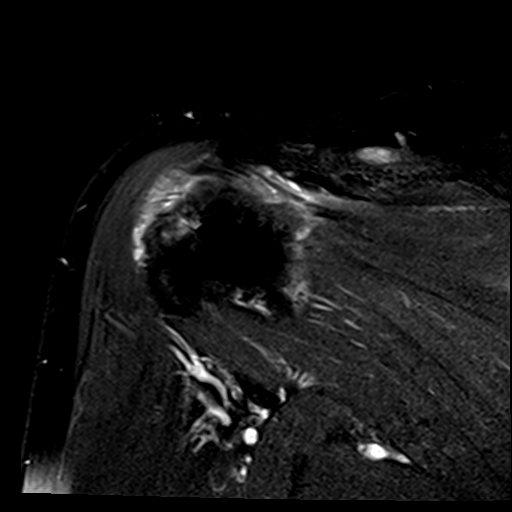
[im 17/17]
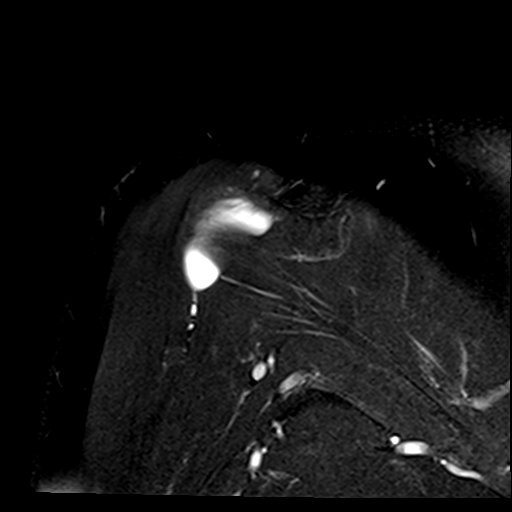

[Series 6: PD · oblique · 4.0mm · 0.44mm/px · 7 of 17 slices shown]
[im 1/17]
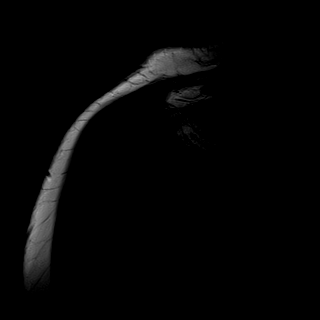
[im 3/17]
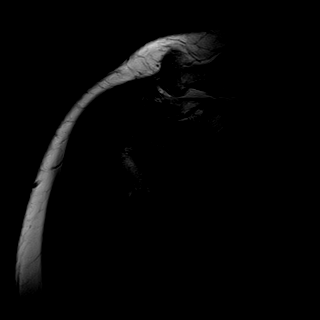
[im 6/17]
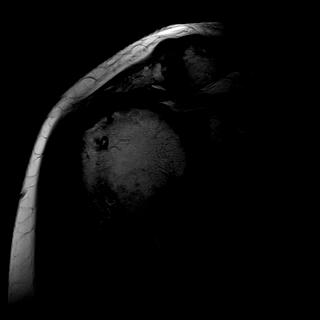
[im 9/17]
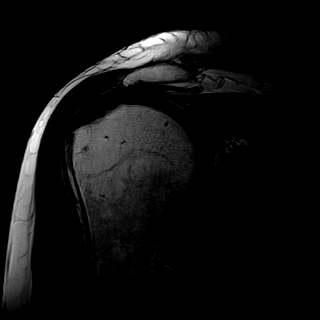
[im 11/17]
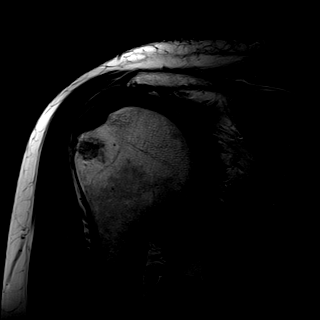
[im 14/17]
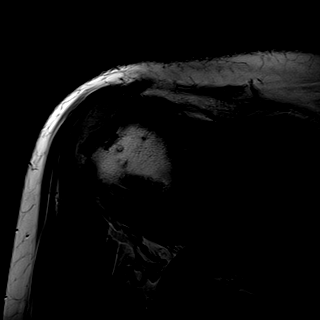
[im 17/17]
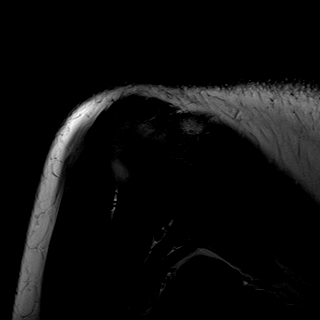

[Series 7: T1 · oblique · 4.0mm · 0.22mm/px · 2 of 20 slices shown]
[im 1/20]
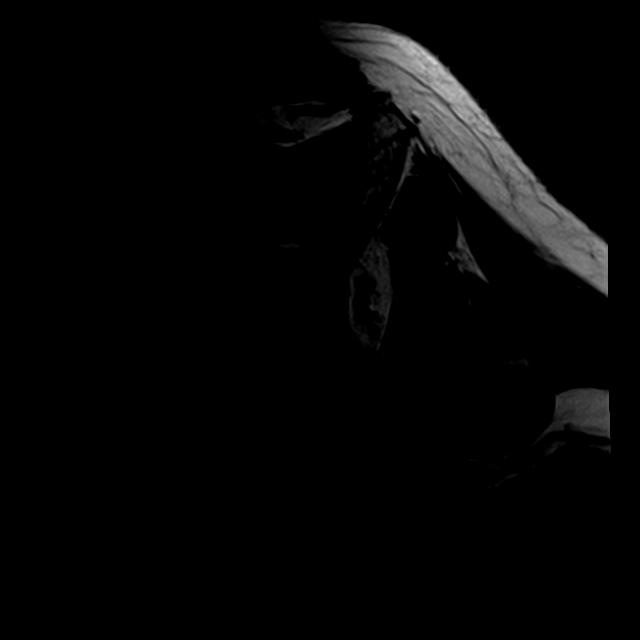
[im 3/20]
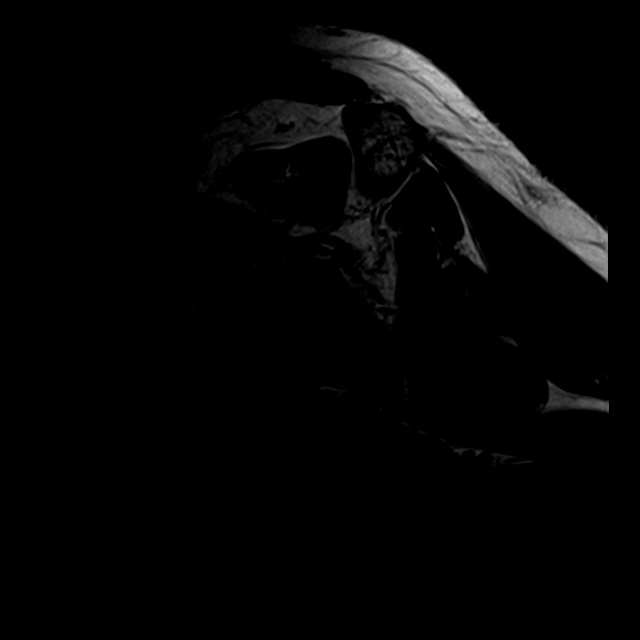

[Series 8: T2 fat-sat · oblique · 4.0mm · 0.55mm/px · 9 of 20 slices shown (3 of 3)]
[im 1/20]
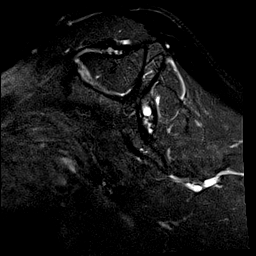
[im 3/20]
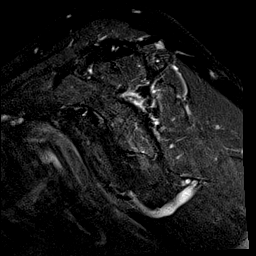
[im 5/20]
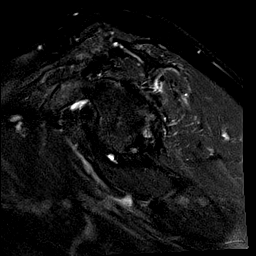
[im 8/20]
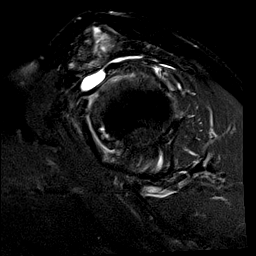
[im 10/20]
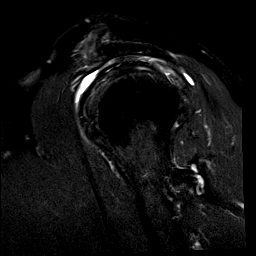
[im 12/20]
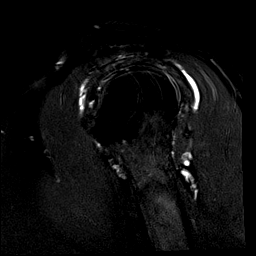
[im 15/20]
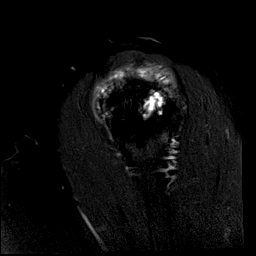
[im 17/20]
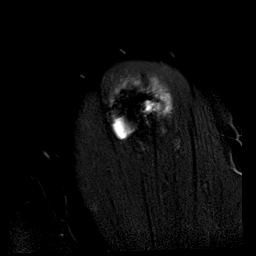
[im 20/20]
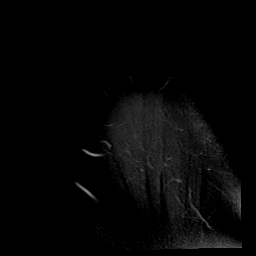

[33 of 40 positions shown; findings below may reference images not displayed]

FINDINGS: Rotator cuff: Moderate tendinosis of the peripheral supraspinatus
tendon with a high-grade partial bursal surface tear versus
full-thickness tear of the anterior peripheral fibers measuring 15
mm and anterior-posterior dimension. Severe tendinosis of the
infraspinatus tendon without a discrete tear. Teres minor tendon is
intact. Subscapularis tendon is intact.

Muscles: No atrophy or fatty replacement of nor abnormal signal
within, the muscles of the rotator cuff.

Biceps long head:  Intact.

Acromioclavicular Joint: Severe degenerative changes of the
acromioclavicular joint. Type II acromion. Small amount of fluid in
the subacromial/subdeltoid bursa.

Glenohumeral Joint: No significant joint effusion. Partial thickness
cartilage loss of the medial humeral head and glenoid. Marginal
osteophytosis of the glenohumeral joint. No dislocation.

Labrum: Inferior labral tear extending into the superior and
posterior labrum with a small inferior paralabral cyst.

Bones: Cystic changes in the posterior lateral humeral head, likely
reactive. No focal marrow signal abnormality. No fracture or
dislocation.
IMPRESSION: 1. Moderate tendinosis of the peripheral supraspinatus tendon with a
high-grade partial bursal surface tear versus full-thickness tear of
the anterior peripheral fibers measuring 15 mm and
anterior-posterior dimension.
2. Severe tendinosis of the infraspinatus tendon without a discrete
tear.
3. Moderate osteoarthritis of the glenohumeral joint.
4. Subacromial/subdeltoid bursitis.
5. Inferior labral tear extending into the superior and posterior
labrum with a small inferior paralabral cyst.

## 2016-02-08 ENCOUNTER — Ambulatory Visit (INDEPENDENT_AMBULATORY_CARE_PROVIDER_SITE_OTHER): Payer: Medicare Other | Admitting: *Deleted

## 2016-02-08 DIAGNOSIS — Z7901 Long term (current) use of anticoagulants: Secondary | ICD-10-CM

## 2016-02-08 DIAGNOSIS — Z5181 Encounter for therapeutic drug level monitoring: Secondary | ICD-10-CM | POA: Diagnosis not present

## 2016-02-08 DIAGNOSIS — I059 Rheumatic mitral valve disease, unspecified: Secondary | ICD-10-CM | POA: Diagnosis not present

## 2016-02-08 LAB — POCT INR: INR: 3.8

## 2016-03-10 ENCOUNTER — Other Ambulatory Visit: Payer: Self-pay | Admitting: *Deleted

## 2016-03-10 MED ORDER — WARFARIN SODIUM 3 MG PO TABS: ORAL_TABLET | ORAL | 1 refills | Status: DC

## 2016-03-21 ENCOUNTER — Encounter (INDEPENDENT_AMBULATORY_CARE_PROVIDER_SITE_OTHER): Payer: Self-pay

## 2016-03-21 ENCOUNTER — Ambulatory Visit (INDEPENDENT_AMBULATORY_CARE_PROVIDER_SITE_OTHER): Payer: Medicare Other | Admitting: Pharmacist

## 2016-03-21 DIAGNOSIS — Z5181 Encounter for therapeutic drug level monitoring: Secondary | ICD-10-CM | POA: Diagnosis not present

## 2016-03-21 DIAGNOSIS — Z7901 Long term (current) use of anticoagulants: Secondary | ICD-10-CM

## 2016-03-21 DIAGNOSIS — I059 Rheumatic mitral valve disease, unspecified: Secondary | ICD-10-CM | POA: Diagnosis not present

## 2016-03-21 LAB — POCT INR: INR: 3.8

## 2016-05-02 ENCOUNTER — Ambulatory Visit (INDEPENDENT_AMBULATORY_CARE_PROVIDER_SITE_OTHER): Payer: Medicare Other | Admitting: Pharmacist

## 2016-05-02 DIAGNOSIS — Z5181 Encounter for therapeutic drug level monitoring: Secondary | ICD-10-CM | POA: Diagnosis not present

## 2016-05-02 DIAGNOSIS — I059 Rheumatic mitral valve disease, unspecified: Secondary | ICD-10-CM | POA: Diagnosis not present

## 2016-05-02 DIAGNOSIS — Z7901 Long term (current) use of anticoagulants: Secondary | ICD-10-CM | POA: Diagnosis not present

## 2016-05-02 LAB — POCT INR: INR: 4.8

## 2016-05-19 ENCOUNTER — Other Ambulatory Visit: Payer: Self-pay | Admitting: Cardiology

## 2016-06-02 ENCOUNTER — Ambulatory Visit (INDEPENDENT_AMBULATORY_CARE_PROVIDER_SITE_OTHER): Payer: Medicare Other

## 2016-06-02 DIAGNOSIS — Z5181 Encounter for therapeutic drug level monitoring: Secondary | ICD-10-CM | POA: Diagnosis not present

## 2016-06-02 DIAGNOSIS — Z7901 Long term (current) use of anticoagulants: Secondary | ICD-10-CM

## 2016-06-02 DIAGNOSIS — I059 Rheumatic mitral valve disease, unspecified: Secondary | ICD-10-CM | POA: Diagnosis not present

## 2016-06-02 LAB — POCT INR: INR: 2.7
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
## 2016-06-12 ENCOUNTER — Ambulatory Visit (INDEPENDENT_AMBULATORY_CARE_PROVIDER_SITE_OTHER): Payer: Medicare Other | Admitting: Cardiology

## 2016-06-12 ENCOUNTER — Encounter: Payer: Self-pay | Admitting: Cardiology

## 2016-06-12 VITALS — BP 140/74 | HR 64 | Ht 67.0 in | Wt 182.0 lb

## 2016-06-12 DIAGNOSIS — I441 Atrioventricular block, second degree: Secondary | ICD-10-CM

## 2016-06-12 DIAGNOSIS — E7800 Pure hypercholesterolemia, unspecified: Secondary | ICD-10-CM

## 2016-06-12 DIAGNOSIS — I340 Nonrheumatic mitral (valve) insufficiency: Secondary | ICD-10-CM

## 2016-06-12 DIAGNOSIS — I10 Essential (primary) hypertension: Secondary | ICD-10-CM | POA: Diagnosis not present

## 2016-06-12 DIAGNOSIS — Z952 Presence of prosthetic heart valve: Secondary | ICD-10-CM

## 2016-06-12 NOTE — Patient Instructions (Signed)
Continue your current therapy  I will see you in one year   

## 2016-07-11 ENCOUNTER — Ambulatory Visit (INDEPENDENT_AMBULATORY_CARE_PROVIDER_SITE_OTHER): Payer: Medicare Other

## 2016-07-11 DIAGNOSIS — Z7901 Long term (current) use of anticoagulants: Secondary | ICD-10-CM

## 2016-07-11 DIAGNOSIS — Z5181 Encounter for therapeutic drug level monitoring: Secondary | ICD-10-CM | POA: Diagnosis not present

## 2016-07-11 DIAGNOSIS — I059 Rheumatic mitral valve disease, unspecified: Secondary | ICD-10-CM

## 2016-07-11 LAB — POCT INR: INR: 3.4

## 2016-08-22 ENCOUNTER — Ambulatory Visit (INDEPENDENT_AMBULATORY_CARE_PROVIDER_SITE_OTHER): Payer: Medicare Other | Admitting: Pharmacist

## 2016-08-22 DIAGNOSIS — Z5181 Encounter for therapeutic drug level monitoring: Secondary | ICD-10-CM | POA: Diagnosis not present

## 2016-08-22 DIAGNOSIS — Z7901 Long term (current) use of anticoagulants: Secondary | ICD-10-CM

## 2016-08-22 DIAGNOSIS — I059 Rheumatic mitral valve disease, unspecified: Secondary | ICD-10-CM | POA: Diagnosis not present

## 2016-08-22 LAB — POCT INR: INR: 3.2

## 2016-09-05 ENCOUNTER — Other Ambulatory Visit: Payer: Self-pay | Admitting: Cardiology

## 2016-10-03 ENCOUNTER — Ambulatory Visit (INDEPENDENT_AMBULATORY_CARE_PROVIDER_SITE_OTHER): Payer: Medicare Other | Admitting: *Deleted

## 2016-10-03 DIAGNOSIS — Z5181 Encounter for therapeutic drug level monitoring: Secondary | ICD-10-CM

## 2016-10-03 DIAGNOSIS — Z7901 Long term (current) use of anticoagulants: Secondary | ICD-10-CM | POA: Diagnosis not present

## 2016-10-03 DIAGNOSIS — I059 Rheumatic mitral valve disease, unspecified: Secondary | ICD-10-CM | POA: Diagnosis not present

## 2016-10-03 LAB — POCT INR: INR: 4.4

## 2016-10-31 ENCOUNTER — Ambulatory Visit (INDEPENDENT_AMBULATORY_CARE_PROVIDER_SITE_OTHER): Payer: Medicare Other | Admitting: *Deleted

## 2016-10-31 DIAGNOSIS — I059 Rheumatic mitral valve disease, unspecified: Secondary | ICD-10-CM

## 2016-10-31 DIAGNOSIS — Z5181 Encounter for therapeutic drug level monitoring: Secondary | ICD-10-CM

## 2016-10-31 DIAGNOSIS — Z7901 Long term (current) use of anticoagulants: Secondary | ICD-10-CM | POA: Diagnosis not present

## 2016-10-31 LAB — POCT INR: INR: 2.6

## 2016-12-12 ENCOUNTER — Encounter (INDEPENDENT_AMBULATORY_CARE_PROVIDER_SITE_OTHER): Payer: Self-pay

## 2016-12-12 ENCOUNTER — Ambulatory Visit (INDEPENDENT_AMBULATORY_CARE_PROVIDER_SITE_OTHER): Payer: Medicare Other | Admitting: *Deleted

## 2016-12-12 DIAGNOSIS — I059 Rheumatic mitral valve disease, unspecified: Secondary | ICD-10-CM

## 2016-12-12 DIAGNOSIS — Z5181 Encounter for therapeutic drug level monitoring: Secondary | ICD-10-CM | POA: Diagnosis not present

## 2016-12-12 DIAGNOSIS — Z7901 Long term (current) use of anticoagulants: Secondary | ICD-10-CM

## 2016-12-12 LAB — POCT INR: INR: 2.9

## 2017-01-23 ENCOUNTER — Encounter (INDEPENDENT_AMBULATORY_CARE_PROVIDER_SITE_OTHER): Payer: Self-pay

## 2017-01-23 ENCOUNTER — Ambulatory Visit (INDEPENDENT_AMBULATORY_CARE_PROVIDER_SITE_OTHER): Payer: Medicare Other

## 2017-01-23 DIAGNOSIS — Z7901 Long term (current) use of anticoagulants: Secondary | ICD-10-CM

## 2017-01-23 DIAGNOSIS — I059 Rheumatic mitral valve disease, unspecified: Secondary | ICD-10-CM

## 2017-01-23 DIAGNOSIS — Z5181 Encounter for therapeutic drug level monitoring: Secondary | ICD-10-CM

## 2017-01-23 LAB — POCT INR: INR: 2.6

## 2017-02-19 ENCOUNTER — Other Ambulatory Visit: Payer: Self-pay | Admitting: Cardiology

## 2017-03-06 ENCOUNTER — Ambulatory Visit (INDEPENDENT_AMBULATORY_CARE_PROVIDER_SITE_OTHER): Payer: Medicare Other | Admitting: *Deleted

## 2017-03-06 DIAGNOSIS — Z5181 Encounter for therapeutic drug level monitoring: Secondary | ICD-10-CM | POA: Diagnosis not present

## 2017-03-06 DIAGNOSIS — Z7901 Long term (current) use of anticoagulants: Secondary | ICD-10-CM | POA: Diagnosis not present

## 2017-03-06 DIAGNOSIS — I059 Rheumatic mitral valve disease, unspecified: Secondary | ICD-10-CM

## 2017-03-06 LAB — POCT INR: INR: 3.2

## 2017-03-14 ENCOUNTER — Other Ambulatory Visit: Payer: Self-pay | Admitting: Cardiology

## 2017-04-17 ENCOUNTER — Ambulatory Visit (INDEPENDENT_AMBULATORY_CARE_PROVIDER_SITE_OTHER): Payer: Medicare Other | Admitting: Pharmacist

## 2017-04-17 DIAGNOSIS — I059 Rheumatic mitral valve disease, unspecified: Secondary | ICD-10-CM | POA: Diagnosis not present

## 2017-04-17 DIAGNOSIS — Z5181 Encounter for therapeutic drug level monitoring: Secondary | ICD-10-CM | POA: Diagnosis not present

## 2017-04-17 LAB — POCT INR: INR: 2.8

## 2017-05-19 ENCOUNTER — Other Ambulatory Visit: Payer: Self-pay | Admitting: Cardiology

## 2017-05-29 ENCOUNTER — Ambulatory Visit (INDEPENDENT_AMBULATORY_CARE_PROVIDER_SITE_OTHER): Payer: Medicare Other

## 2017-05-29 DIAGNOSIS — I059 Rheumatic mitral valve disease, unspecified: Secondary | ICD-10-CM | POA: Diagnosis not present

## 2017-05-29 DIAGNOSIS — Z5181 Encounter for therapeutic drug level monitoring: Secondary | ICD-10-CM

## 2017-05-29 LAB — POCT INR: INR: 3.8

## 2017-06-01 ENCOUNTER — Telehealth: Payer: Self-pay | Admitting: Cardiology

## 2017-06-01 NOTE — Telephone Encounter (Signed)
Received records from [facility] for appointment on 06/09/17 with Dr Martinique.  Records put with Dr Doug Sou schedule for 06/09/17. lp
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
## 2017-06-09 ENCOUNTER — Ambulatory Visit: Payer: Medicare Other | Admitting: Cardiology

## 2017-06-09 ENCOUNTER — Ambulatory Visit (INDEPENDENT_AMBULATORY_CARE_PROVIDER_SITE_OTHER): Payer: Medicare Other | Admitting: Cardiology

## 2017-06-09 ENCOUNTER — Encounter: Payer: Self-pay | Admitting: Cardiology

## 2017-06-09 VITALS — BP 120/62 | HR 73 | Ht 67.0 in | Wt 169.8 lb

## 2017-06-09 DIAGNOSIS — I059 Rheumatic mitral valve disease, unspecified: Secondary | ICD-10-CM

## 2017-06-09 DIAGNOSIS — Z952 Presence of prosthetic heart valve: Secondary | ICD-10-CM | POA: Diagnosis not present

## 2017-06-09 DIAGNOSIS — I10 Essential (primary) hypertension: Secondary | ICD-10-CM

## 2017-06-09 DIAGNOSIS — I441 Atrioventricular block, second degree: Secondary | ICD-10-CM

## 2017-06-09 NOTE — Patient Instructions (Signed)
Continue your current therapy  I will see you in one year   
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
## 2017-07-03 ENCOUNTER — Ambulatory Visit (INDEPENDENT_AMBULATORY_CARE_PROVIDER_SITE_OTHER): Payer: Medicare Other | Admitting: Pharmacist

## 2017-07-03 DIAGNOSIS — I059 Rheumatic mitral valve disease, unspecified: Secondary | ICD-10-CM

## 2017-07-03 DIAGNOSIS — Z5181 Encounter for therapeutic drug level monitoring: Secondary | ICD-10-CM

## 2017-07-03 LAB — POCT INR: INR: 4.8

## 2017-07-03 NOTE — Patient Instructions (Signed)
Skip today's dosage of Coumadin, then resume same dosage of 1 tablet every day except 1/2 tablet on Fridays. Recheck INR in 3 weeks.

## 2017-08-17 ENCOUNTER — Other Ambulatory Visit: Payer: Self-pay | Admitting: Cardiology

## 2017-09-16 ENCOUNTER — Ambulatory Visit (INDEPENDENT_AMBULATORY_CARE_PROVIDER_SITE_OTHER): Payer: Medicare Other | Admitting: *Deleted

## 2017-09-16 ENCOUNTER — Encounter (INDEPENDENT_AMBULATORY_CARE_PROVIDER_SITE_OTHER): Payer: Self-pay

## 2017-09-16 DIAGNOSIS — Z5181 Encounter for therapeutic drug level monitoring: Secondary | ICD-10-CM

## 2017-09-16 DIAGNOSIS — I059 Rheumatic mitral valve disease, unspecified: Secondary | ICD-10-CM

## 2017-09-16 LAB — POCT INR: INR: 4

## 2017-09-16 NOTE — Patient Instructions (Signed)
Description   Skip today's dosage of Coumadin, then start taking 1 tablet every day except 1/2 tablet on Mondays and Fridays. Recheck INR in 2 weeks.

## 2017-09-21 ENCOUNTER — Other Ambulatory Visit: Payer: Self-pay | Admitting: Cardiology

## 2017-12-15 ENCOUNTER — Telehealth: Payer: Self-pay

## 2017-12-15 NOTE — Telephone Encounter (Signed)
Called pt, overdue for Coumadin check.  Pt states he is going elsewhere to have his Coumadin managed and monitored, would not disclose name of [facility] office he is now following with, just that he was no longer following here in the [facility]madin Clinic.

## 2017-12-17 DIAGNOSIS — Z7901 Long term (current) use of anticoagulants: Secondary | ICD-10-CM | POA: Insufficient documentation

## 2017-12-21 ENCOUNTER — Other Ambulatory Visit: Payer: Self-pay | Admitting: Cardiology

## 2017-12-21 NOTE — Telephone Encounter (Signed)
Looks non-compliant

## 2017-12-25 ENCOUNTER — Other Ambulatory Visit: Payer: Self-pay | Admitting: Cardiology

## 2017-12-29 ENCOUNTER — Other Ambulatory Visit: Payer: Self-pay

## 2018-05-28 ENCOUNTER — Other Ambulatory Visit: Payer: Self-pay | Admitting: Nurse Practitioner

## 2018-05-28 DIAGNOSIS — R4182 Altered mental status, unspecified: Secondary | ICD-10-CM

## 2018-06-09 ENCOUNTER — Ambulatory Visit
Admission: RE | Admit: 2018-06-09 | Discharge: 2018-06-09 | Disposition: A | Discharge status: Home / Self Care | Payer: Medicare Other | Source: Ambulatory Visit | Attending: Nurse Practitioner | Admitting: Nurse Practitioner

## 2018-06-09 DIAGNOSIS — I639 Cerebral infarction, unspecified: Secondary | ICD-10-CM

## 2018-06-09 DIAGNOSIS — R4182 Altered mental status, unspecified: Secondary | ICD-10-CM

## 2018-06-09 HISTORY — DX: Cerebral infarction, unspecified: I63.9

## 2018-06-09 MED ORDER — GADOBENATE DIMEGLUMINE 529 MG/ML IV SOLN
15.0000 mL | Freq: Once | INTRAVENOUS | Status: AC | PRN
  Administered 2018-06-09: 15 mL via INTRAVENOUS
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
## 2018-07-15 ENCOUNTER — Encounter: Payer: Self-pay | Admitting: Cardiology

## 2018-07-15 ENCOUNTER — Ambulatory Visit (INDEPENDENT_AMBULATORY_CARE_PROVIDER_SITE_OTHER): Payer: Medicare Other | Admitting: Cardiology

## 2018-07-15 VITALS — BP 122/64 | HR 63 | Ht 67.0 in | Wt 166.0 lb

## 2018-07-15 DIAGNOSIS — I441 Atrioventricular block, second degree: Secondary | ICD-10-CM

## 2018-07-15 DIAGNOSIS — Z952 Presence of prosthetic heart valve: Secondary | ICD-10-CM

## 2018-07-15 DIAGNOSIS — I059 Rheumatic mitral valve disease, unspecified: Secondary | ICD-10-CM | POA: Diagnosis not present

## 2018-07-15 DIAGNOSIS — I10 Essential (primary) hypertension: Secondary | ICD-10-CM | POA: Diagnosis not present

## 2018-07-15 NOTE — Patient Instructions (Signed)
Stop Zocor  Continue your other therapy

## 2019-05-26 ENCOUNTER — Encounter: Payer: Self-pay | Admitting: Psychology

## 2019-07-19 ENCOUNTER — Ambulatory Visit (INDEPENDENT_AMBULATORY_CARE_PROVIDER_SITE_OTHER): Payer: Medicare Other | Admitting: Psychology

## 2019-07-19 ENCOUNTER — Other Ambulatory Visit: Payer: Self-pay

## 2019-07-19 ENCOUNTER — Encounter: Payer: Self-pay | Admitting: Psychology

## 2019-07-19 ENCOUNTER — Ambulatory Visit: Payer: Medicare Other

## 2019-07-19 DIAGNOSIS — G3184 Mild cognitive impairment, so stated: Secondary | ICD-10-CM | POA: Diagnosis not present

## 2019-07-19 DIAGNOSIS — F411 Generalized anxiety disorder: Secondary | ICD-10-CM | POA: Diagnosis not present

## 2019-07-19 DIAGNOSIS — F330 Major depressive disorder, recurrent, mild: Secondary | ICD-10-CM | POA: Diagnosis not present

## 2019-07-19 DIAGNOSIS — R4189 Other symptoms and signs involving cognitive functions and awareness: Secondary | ICD-10-CM

## 2019-07-19 NOTE — Progress Notes (Signed)
   Psychometrician Note   Nathan Mcdaniel completed 145 minutes of neuropsychological testing with technician, Cruzita Lederer, B.S., under the supervision of Dr. Christia Reading, Ph.D., licensed psychologist. The patient did not appear overtly distressed by the testing session, per behavioral observation or via self-report to the technician. Rest breaks were offered.    In considering the patient's current level of functioning, level of presumed impairment, nature of symptoms, emotional and behavioral responses during the interview, level of literacy, and observed level of motivation/effort, a battery of tests was selected and communicated to the psychometrician.   Communication between the psychologist and technician was ongoing throughout the testing session and changes were made as deemed necessary based on patient performance on testing, technician observations and additional pertinent factors such as those listed above.   Nathan Mcdaniel will return within approximately two weeks for an interactive feedback session with Dr. Melvyn Novas at which time his test performances, clinical impressions, and treatment recommendations will be reviewed in detail. The patient understands he can contact our office should he require our assistance before this time.  145 minutes were spent face-to-face with patient administering standardized tests and 15 minutes were spent scoring (technician). [CPT [id number], [id number]  This note reflects time spent with the psychometrician and does not include test scores or any clinical interpretations made by Dr. Melvyn Novas. The full report will follow in a separate note.
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
## 2019-07-20 ENCOUNTER — Encounter: Payer: Self-pay | Admitting: Psychology

## 2019-07-20 DIAGNOSIS — F411 Generalized anxiety disorder: Secondary | ICD-10-CM | POA: Insufficient documentation

## 2019-07-20 DIAGNOSIS — G3184 Mild cognitive impairment, so stated: Secondary | ICD-10-CM

## 2019-07-20 DIAGNOSIS — F329 Major depressive disorder, single episode, unspecified: Secondary | ICD-10-CM | POA: Insufficient documentation

## 2019-07-20 HISTORY — DX: Mild cognitive impairment of uncertain or unknown etiology: G31.84

## 2019-08-02 ENCOUNTER — Ambulatory Visit (INDEPENDENT_AMBULATORY_CARE_PROVIDER_SITE_OTHER): Payer: Medicare Other | Admitting: Psychology

## 2019-08-02 ENCOUNTER — Other Ambulatory Visit: Payer: Self-pay

## 2019-08-02 DIAGNOSIS — G3184 Mild cognitive impairment, so stated: Secondary | ICD-10-CM

## 2019-08-02 NOTE — Progress Notes (Signed)
   Neuropsychology Feedback Session Tillie Rung. [place] [facility] of Neurology  Reason for Referral:   Nathan BLAUSER a 78 y.o. male referred by Tommas Olp, M.D.,to characterize hiscurrent cognitive functioning and assist with diagnostic clarity and treatment planning in the context of subjective cognitive decline and mild psychiatric distress.  Feedback:   Nathan Mcdaniel completed a comprehensive neuropsychological evaluation on 07/19/2019. Please refer to that encounter for the full report and recommendations. Briefly, results suggested primary deficits in processing speed, cognitive flexibility, confrontation naming, and learning and memory (especially verbal information). Regarding the latter, Nathan Mcdaniel exhibited notable difficulties learning new information efficiently. While retrieval and consolidation aspects of memory were also very poor, it is possible that this is due to him never truly learning the information initially, rather than evidence for rapid forgetting. Overall, given evidence for cognitive dysfunction, coupled with Nathan Mcdaniel report of intact ability to complete activities of daily living (ADLs), he meets criteria for a mild neurocognitive disorder (formerly "mild cognitive impairment") at the present time.  Nathan Mcdaniel was accompanied by his wife during the current telephone call. Content of the current session focused on the results of his neuropsychological evaluation. Nathan Mcdaniel and his wife were given the opportunity to ask questions and their questions were answered. He was also encouraged to reach out should additional questions arise.     A total of 25 minutes were spent with Nathan Mcdaniel during the current feedback session.

## 2019-09-03 ENCOUNTER — Encounter (HOSPITAL_COMMUNITY): Payer: Self-pay | Admitting: Emergency Medicine

## 2019-09-03 ENCOUNTER — Emergency Department (HOSPITAL_COMMUNITY)
Admission: EM | Admit: 2019-09-03 | Discharge: 2019-09-03 | Disposition: A | Discharge status: Home / Self Care | Payer: Medicare Other | Attending: Emergency Medicine | Admitting: Emergency Medicine

## 2019-09-03 DIAGNOSIS — L089 Local infection of the skin and subcutaneous tissue, unspecified: Secondary | ICD-10-CM

## 2019-09-03 DIAGNOSIS — T8140XA Infection following a procedure, unspecified, initial encounter: Secondary | ICD-10-CM | POA: Insufficient documentation

## 2019-09-03 DIAGNOSIS — Z79899 Other long term (current) drug therapy: Secondary | ICD-10-CM | POA: Diagnosis not present

## 2019-09-03 DIAGNOSIS — Y69 Unspecified misadventure during surgical and medical care: Secondary | ICD-10-CM | POA: Diagnosis not present

## 2019-09-03 DIAGNOSIS — R220 Localized swelling, mass and lump, head: Secondary | ICD-10-CM | POA: Diagnosis present

## 2019-09-03 DIAGNOSIS — Z7901 Long term (current) use of anticoagulants: Secondary | ICD-10-CM | POA: Diagnosis not present

## 2019-09-03 DIAGNOSIS — Z9889 Other specified postprocedural states: Secondary | ICD-10-CM | POA: Diagnosis not present

## 2019-09-03 DIAGNOSIS — I10 Essential (primary) hypertension: Secondary | ICD-10-CM | POA: Diagnosis not present

## 2019-09-03 MED ORDER — CEPHALEXIN 500 MG PO CAPS: 500.0000 mg | ORAL_CAPSULE | Freq: Four times a day (QID) | ORAL | 0 refills | Status: DC

## 2019-09-03 MED ORDER — DOXYCYCLINE HYCLATE 100 MG PO CAPS: 100.0000 mg | ORAL_CAPSULE | Freq: Two times a day (BID) | ORAL | 0 refills | Status: DC

## 2019-09-03 NOTE — ED Provider Notes (Signed)
[facility] Provider Note   CSN: [id number] Arrival date & time: 09/03/19  1225     History Chief Complaint  Patient presents with  . Abscess    Nathan Mcdaniel is a 79 y.o. male.  HPI Patient is 79 year old male presented today with a bump to his right forehead.  He had biopsy done 3 days before bump appeared.  And it has been present for 6 days.  Wife at bedside states that it sometimes appears to be somewhat larger and sometimes somewhat smaller but is been relatively stable.  No discharge, no bleeding, no headaches, no fevers or chills, no pain unless pushed on firmly.  Describes lump as non-painful. Non pruritic. 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      Past Medical History:  Diagnosis Date  . Abnormal ECG   . Generalized anxiety disorder   . Hypercholesterolemia   . Hypertension   . Major depressive disorder   . 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 Mitral insufficiency   . Mobitz type 1 second degree atrioventricular block   . Palpitations   . 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 Major depressive disorder   . Generalized anxiety disorder   . Mobitz type 1 second degree atrioventricular block   . Abnormal ECG   . 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 Hypertension   . Palpitations   . Mitral insufficiency   . Hypercholesterolemia     Past Surgical History:  Procedure Laterality Date  . CHOLECYSTECTOMY    . COLON SURGERY     with diversion and resection  . MITRAL VALVE REPLACEMENT  2002   with a st.jude mechanical valve  . PROSTATE BIOPSY         Family History  Problem Relation Age of Onset  . Heart failure Mother   . Hypertension Mother   . Lung cancer Sister     Social History   Tobacco Use  . Smoking status: Former Smoker    Packs/day: 0.80    Years: 18.00    Pack years: 14.40    Types:  Cigarettes    Quit date: 08/05/1979    Years since quitting: 40.1  . Smokeless tobacco: Never Used  Substance Use Topics  . Alcohol use: No  . Drug use: No    Home Medications Prior to Admission medications   Medication Sig Start Date End Date Taking? Authorizing Provider  Cholecalciferol 5000 UNITS capsule Take 5,000 Units by mouth daily.      [provider]  Cyanocobalamin (VITAMIN B-12 PO) Take 1 tablet by mouth daily.      [provider]  doxycycline (VIBRAMYCIN) 100 MG capsule Take 1 capsule (100 mg total) by mouth 2 (two) times daily. 09/03/19   Noelle Hoogland, Kathleene Hazel, PA  fluticasone (FLONASE) 50 MCG/ACT nasal spray Place 1-2 sprays into both nostrils daily as needed. 01/26/15   [provider]  LORazepam (ATIVAN) 0.5 MG tablet Take 0.5 mg by mouth every 8 (eight) hours as needed.     [provider]  sertraline (ZOLOFT) 50 MG tablet TAKE 1 TABLET EVERY DAY 08/28/14   [provider]  simvastatin (ZOCOR) 20 MG tablet Take 20 mg by mouth at bedtime. 01/10/15   [provider]  Tamsulosin HCl (FLOMAX) 0.4 MG CAPS Take 0.4 mg by mouth daily.      [provider]  warfarin (COUMADIN) 3 MG tablet TAKE 1/2 - 1 TABLET DAILY OR AS DIRECTED BY COUMADIN CLINIC 09/21/17   Martinique, Peter M, MD    Allergies    Patient has no known allergies.  Review of Systems   Review of Systems  Constitutional: Negative for chills and fever.  HENT: Negative for congestion.   Eyes: Negative for pain.  Respiratory: Negative for cough and shortness of breath.   Cardiovascular: Negative for chest pain and leg swelling.  Gastrointestinal: Negative for abdominal pain and vomiting.  Genitourinary: Negative for dysuria.  Musculoskeletal: Negative for myalgias.  Skin: Negative for rash.       Lump  Neurological: Negative for dizziness and headaches.    Physical Exam Updated Vital Signs BP (!) 147/73 (BP Location: Left Arm)   Pulse 69   Temp 98.5 F  (36.9 C) (Oral)   Resp 16   SpO2 97%   Physical Exam Vitals and nursing note reviewed.  Constitutional:      General: He is not in acute distress.    Appearance: Normal appearance. He is not ill-appearing.     Comments: Pleasant 79 year old male appears stated age in no acute distress.  HENT:     Head: Normocephalic and atraumatic.     Comments: Right forehead with dime sized raised area that is fluctuant and slightly pulsatile    Mouth/Throat:     Mouth: Mucous membranes are moist.  Eyes:     General: No scleral icterus.       Right eye: No discharge.        Left eye: No discharge.     Conjunctiva/sclera: Conjunctivae normal.  Cardiovascular:     Pulses: Normal pulses.  Pulmonary:     Effort: Pulmonary effort is normal.     Breath sounds: No stridor.  Musculoskeletal:     Cervical back: Normal range of motion and neck supple. No tenderness.  Neurological:     Mental Status: He is alert and oriented to person, place, and time. Mental status is at baseline.       ED Results / Procedures / Treatments   Labs (all labs ordered are listed, but only abnormal results are displayed) Labs Reviewed - No data to display  EKG None  Radiology No results found.  Procedures Procedures (including critical care time)  Medications Ordered in ED Medications - No data to display  ED Course  I have reviewed the triage vital signs and the nursing notes.  Pertinent labs & imaging results that were available during my care of the patient were reviewed by me and considered in my medical decision making (see chart for details).    MDM Rules/Calculators/A&P                      Patient is 79 year old male presented today with a bump to his right forehead.  He had biopsy done 3 days before bump appeared.  And it has been present for 6 days.  Wife at bedside states that it sometimes appears to be somewhat larger and sometimes somewhat smaller but is been relatively stable.  No  discharge, no bleeding, no headaches, no fevers or chills, no pain unless pushed on firmly.  Area is pulsatile and with bedside ultrasound there is evidence of pulsatile flow within the cavity.  For this reason I will not incise and drain or aspirate today.  Will place on doxycycline as there is some indication that there may be some  overlying infection.   I discussed this case with my attending physician who cosigned this note including patient's presenting symptoms, physical exam, and planned diagnostics and interventions. Attending physician stated agreement with plan or made changes to plan which were implemented.   Attending physician assessed patient at bedside.  Consider seroma versus abscess versus small arterial aneurism.   Final Clinical Impression(s) / ED Diagnoses Final diagnoses:  Wound infection    Rx / DC Orders ED Discharge Orders         Ordered    cephALEXin (KEFLEX) 500 MG capsule  4 times daily,   Status:  Discontinued     09/03/19 1346    doxycycline (VIBRAMYCIN) 100 MG capsule  2 times daily     09/03/19 [address] [place], [place] 09/03/19 1353    Milton Ferguson, MD 09/05/19 [phone]

## 2019-09-03 NOTE — Discharge Instructions (Addendum)
Please take antibiotic doxycycline as prescribed.  Please keep your appointment for Wednesday with your dermatologist for follow-up.  Please continue to use warm compresses.

## 2019-09-12 ENCOUNTER — Ambulatory Visit: Payer: Medicare Other | Admitting: Cardiology

## 2019-09-21 ENCOUNTER — Other Ambulatory Visit: Payer: Self-pay

## 2019-09-21 ENCOUNTER — Encounter: Payer: Self-pay | Admitting: Vascular Surgery

## 2019-09-21 ENCOUNTER — Ambulatory Visit (INDEPENDENT_AMBULATORY_CARE_PROVIDER_SITE_OTHER): Payer: Medicare Other | Admitting: Vascular Surgery

## 2019-09-21 VITALS — BP 149/65 | HR 59 | Resp 16 | Ht 66.0 in | Wt 160.0 lb

## 2019-09-21 DIAGNOSIS — I729 Aneurysm of unspecified site: Secondary | ICD-10-CM

## 2019-09-21 DIAGNOSIS — T81718A Complication of other artery following a procedure, not elsewhere classified, initial encounter: Secondary | ICD-10-CM | POA: Diagnosis not present

## 2019-09-21 NOTE — H&P (View-Only) (Signed)
Referring Physician: Dr Allyson Sabal  Patient name: ISSAAC Mcdaniel MRN: [id number] DOB: 1940-09-17 Sex: male  REASON FOR CONSULT: Right forehead pulsatile mass  HPI: [name] is a 79 y.o. 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  Initially had a shave biopsy with a positive margin.  A reexcision was performed with a punch biopsy with no further disease noted.  However, the patient then subsequently developed a pulsatile mass at the area of the punch biopsy site.  He does not really have any pain from this but it is fairly bothersome to him.  He is on warfarin for a mechanical mitral valve.  He is also on aspirin.  He has not had any episodes of bleeding.  Other medical problems include mild memory impairment, elevated cholesterol, hypertension all of which have been stable.  Past Medical History:  Diagnosis Date  . Abnormal ECG   . Generalized anxiety disorder   . Hypercholesterolemia   . Hypertension   . Major depressive disorder   . 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 Mitral insufficiency   . Mobitz type 1 second degree atrioventricular block   . Palpitations   . 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 CHOLECYSTECTOMY    . COLON SURGERY     with diversion and resection  . MITRAL VALVE REPLACEMENT  2002   with a st.jude mechanical valve  . PROSTATE BIOPSY      Family History  Problem Relation Age of Onset  . Heart failure Mother   . Hypertension Mother   . Lung cancer Sister     SOCIAL HISTORY: Social History   Socioeconomic History  . Marital status: Married    Spouse name: Not on file  . Number of children: 2  . Years of education: 43  . Highest education level: Master's degree (e.g., MA, MS, MEng, MEd, MSW, MBA)  Occupational History  . 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 Smoking status: Former Smoker    Packs/day: 0.80    Years: 18.00    Pack years: 14.40    Types: Cigarettes    Quit date: 08/05/1979    Years since quitting: 40.1  . Smokeless tobacco: Never Used  Substance and Sexual Activity  . Alcohol use: No  . Drug use: No  . Sexual activity: Not on file  Other Topics Concern  . Not on file  Social History Narrative  . Not on file   Social Determinants of Health   Financial Resource Strain:   . Difficulty of Paying Living Expenses: Not on file  Food Insecurity:   . Worried About Charity fundraiser in the Last Year: Not on file  . Ran Out of Food in the Last Year: Not on file  Transportation Needs:   . Lack of Transportation (Medical): Not on file  . Lack of Transportation (Non-Medical): Not on file  Physical Activity:   . Days of Exercise per Week: Not on file  . Minutes of Exercise per Session: Not on file  Stress:   . Feeling of Stress : Not on file  Social Connections:   . Frequency of Communication with Friends and Family: Not on file  . Frequency of Social Gatherings with Friends and Family: Not on file  . Attends Religious Services:  Not on file  . Active Member of Clubs or Organizations: Not on file  . Attends Archivist Meetings: Not on file  . Marital Status: Not on file  Intimate Partner Violence:   . Fear of Current or Ex-Partner: Not on file  . Emotionally Abused: Not on file  . Physically Abused: Not on file  . Sexually Abused: Not on file    Allergies  Allergen Reactions  . Donepezil Hcl Other (See Comments)  . Memantine Hcl Other (See Comments)    Current Outpatient Medications  Medication Sig Dispense Refill  . Cholecalciferol 5000 UNITS capsule Take 5,000 Units by mouth daily.      . Cyanocobalamin (VITAMIN B-12 PO) Take 1 tablet by mouth daily.      Marland Kitchen doxycycline (VIBRAMYCIN) 100 MG capsule Take 1 capsule (100 mg total) by mouth 2 (two) times daily. 20 capsule 0  . fluticasone (FLONASE) 50  MCG/ACT nasal spray Place 1-2 sprays into both nostrils daily as needed.  3  . LORazepam (ATIVAN) 0.5 MG tablet Take 0.5 mg by mouth every 8 (eight) hours as needed.     . sertraline (ZOLOFT) 50 MG tablet TAKE 1 TABLET EVERY DAY    . simvastatin (ZOCOR) 20 MG tablet Take 20 mg by mouth at bedtime.  2  . Tamsulosin HCl (FLOMAX) 0.4 MG CAPS Take 0.4 mg by mouth daily.      Marland Kitchen warfarin (COUMADIN) 3 MG tablet TAKE 1/2 - 1 TABLET DAILY OR AS DIRECTED BY COUMADIN CLINIC 90 tablet 0   No current facility-administered medications for this visit.    ROS:   General:  No weight loss, Fever, chills  HEENT: No recent headaches, no nasal bleeding, no visual changes, no sore throat  Neurologic: No dizziness, blackouts, seizures. No recent symptoms of stroke or mini- stroke. No recent episodes of slurred speech, or temporary blindness.  Cardiac: No recent episodes of chest pain/pressure, no shortness of breath at rest.  No shortness of breath with exertion.  Denies history of atrial fibrillation or irregular heartbeat  Vascular: No history of rest pain in feet.  No history of claudication.  No history of non-healing ulcer, No history of DVT   Pulmonary: No home oxygen, no productive cough, no hemoptysis,  No asthma or wheezing  Musculoskeletal:  [ ]  Arthritis, [ ]  Low back pain,  [ ]  Joint pain  Hematologic:No history of hypercoagulable state.  No history of easy bleeding.  No history of anemia  Gastrointestinal: No hematochezia or melena,  No gastroesophageal reflux, no trouble swallowing  Urinary: [ ]  chronic Kidney disease, [ ]  on HD - [ ]  MWF or [ ]  TTHS, [ ]  Burning with urination, [ ]  Frequent urination, [ ]  Difficulty urinating;   Skin: No rashes  Psychological: No history of anxiety,  No history of depression   Physical Examination  Vitals:   09/21/19 1504  BP: (!) 149/65  Pulse: (!) 59  Resp: 16  SpO2: 98%  Weight: 160 lb (72.6 kg)  Height: 5\' 6"  (1.676 m)    Body mass index  is 25.82 kg/m.  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  I spoke with Dr. Allyson Sabal his dermatologist and the patient does not he need any further excision of the squamous cell cancer this has been fully treated.   PLAN: Repair of right frontotemporal pseudoaneurysm in the operating room on September 23, 2019.  Risk benefits possible complications of procedure details were discussed the patient today include but not limited to mainly bleeding and infection.  Patient is on warfarin.  We will stop this today.  I did inform him that his INR may still be slightly elevated on Friday and if this was the case we would give him fresh frozen plasma.  We will bring him early for his INR check for this.  We will then proceed with his operation about midday on Friday after his INR has been corrected if necessary.  Our plan will be to bridge him with Lovenox most likely postoperatively until his INR is back over 2 for his mitral valve.   Ruta Hinds, MD [facility] Office: [phone] Pager: [phone]

## 2019-09-21 NOTE — Progress Notes (Signed)
Referring Physician: Dr Allyson Sabal  Patient name: Nathan Mcdaniel MRN: [id number] DOB: 04-08-1941 Sex: male  REASON FOR CONSULT: Right forehead pulsatile mass  HPI: Nathan Mcdaniel is a 79 y.o. 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  Initially had a shave biopsy with a positive margin.  A reexcision was performed with a punch biopsy with no further disease noted.  However, the patient then subsequently developed a pulsatile mass at the area of the punch biopsy site.  He does not really have any pain from this but it is fairly bothersome to him.  He is on warfarin for a mechanical mitral valve.  He is also on aspirin.  He has not had any episodes of bleeding.  Other medical problems include mild memory impairment, elevated cholesterol, hypertension all of which have been stable.  Past Medical History:  Diagnosis Date  . Abnormal ECG   . Generalized anxiety disorder   . Hypercholesterolemia   . Hypertension   . Major depressive disorder   . 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 Mitral insufficiency   . Mobitz type 1 second degree atrioventricular block   . Palpitations   . 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 CHOLECYSTECTOMY    . COLON SURGERY     with diversion and resection  . MITRAL VALVE REPLACEMENT  2002   with a st.jude mechanical valve  . PROSTATE BIOPSY      Family History  Problem Relation Age of Onset  . Heart failure Mother   . Hypertension Mother   . Lung cancer Sister     SOCIAL HISTORY: Social History   Socioeconomic History  . Marital status: Married    Spouse name: Not on file  . Number of children: 2  . Years of education: 66  . Highest education level: Master's degree (e.g., MA, MS, MEng, MEd, MSW, MBA)  Occupational History  . 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 Smoking status: Former Smoker    Packs/day: 0.80    Years: 18.00    Pack years: 14.40    Types: Cigarettes    Quit date: 08/05/1979    Years since quitting: 40.1  . Smokeless tobacco: Never Used  Substance and Sexual Activity  . Alcohol use: No  . Drug use: No  . Sexual activity: Not on file  Other Topics Concern  . Not on file  Social History Narrative  . Not on file   Social Determinants of Health   Financial Resource Strain:   . Difficulty of Paying Living Expenses: Not on file  Food Insecurity:   . Worried About Charity fundraiser in the Last Year: Not on file  . Ran Out of Food in the Last Year: Not on file  Transportation Needs:   . Lack of Transportation (Medical): Not on file  . Lack of Transportation (Non-Medical): Not on file  Physical Activity:   . Days of Exercise per Week: Not on file  . Minutes of Exercise per Session: Not on file  Stress:   . Feeling of Stress : Not on file  Social Connections:   . Frequency of Communication with Friends and Family: Not on file  . Frequency of Social Gatherings with Friends and Family: Not on file  . Attends Religious Services:  Not on file  . Active Member of Clubs or Organizations: Not on file  . Attends Archivist Meetings: Not on file  . Marital Status: Not on file  Intimate Partner Violence:   . Fear of Current or Ex-Partner: Not on file  . Emotionally Abused: Not on file  . Physically Abused: Not on file  . Sexually Abused: Not on file    Allergies  Allergen Reactions  . Donepezil Hcl Other (See Comments)  . Memantine Hcl Other (See Comments)    Current Outpatient Medications  Medication Sig Dispense Refill  . Cholecalciferol 5000 UNITS capsule Take 5,000 Units by mouth daily.      . Cyanocobalamin (VITAMIN B-12 PO) Take 1 tablet by mouth daily.      Marland Kitchen doxycycline (VIBRAMYCIN) 100 MG capsule Take 1 capsule (100 mg total) by mouth 2 (two) times daily. 20 capsule 0  . fluticasone (FLONASE) 50  MCG/ACT nasal spray Place 1-2 sprays into both nostrils daily as needed.  3  . LORazepam (ATIVAN) 0.5 MG tablet Take 0.5 mg by mouth every 8 (eight) hours as needed.     . sertraline (ZOLOFT) 50 MG tablet TAKE 1 TABLET EVERY DAY    . simvastatin (ZOCOR) 20 MG tablet Take 20 mg by mouth at bedtime.  2  . Tamsulosin HCl (FLOMAX) 0.4 MG CAPS Take 0.4 mg by mouth daily.      Marland Kitchen warfarin (COUMADIN) 3 MG tablet TAKE 1/2 - 1 TABLET DAILY OR AS DIRECTED BY COUMADIN CLINIC 90 tablet 0   No current facility-administered medications for this visit.    ROS:   General:  No weight loss, Fever, chills  HEENT: No recent headaches, no nasal bleeding, no visual changes, no sore throat  Neurologic: No dizziness, blackouts, seizures. No recent symptoms of stroke or mini- stroke. No recent episodes of slurred speech, or temporary blindness.  Cardiac: No recent episodes of chest pain/pressure, no shortness of breath at rest.  No shortness of breath with exertion.  Denies history of atrial fibrillation or irregular heartbeat  Vascular: No history of rest pain in feet.  No history of claudication.  No history of non-healing ulcer, No history of DVT   Pulmonary: No home oxygen, no productive cough, no hemoptysis,  No asthma or wheezing  Musculoskeletal:  [ ]  Arthritis, [ ]  Low back pain,  [ ]  Joint pain  Hematologic:No history of hypercoagulable state.  No history of easy bleeding.  No history of anemia  Gastrointestinal: No hematochezia or melena,  No gastroesophageal reflux, no trouble swallowing  Urinary: [ ]  chronic Kidney disease, [ ]  on HD - [ ]  MWF or [ ]  TTHS, [ ]  Burning with urination, [ ]  Frequent urination, [ ]  Difficulty urinating;   Skin: No rashes  Psychological: No history of anxiety,  No history of depression   Physical Examination  Vitals:   09/21/19 1504  BP: (!) 149/65  Pulse: (!) 59  Resp: 16  SpO2: 98%  Weight: 160 lb (72.6 kg)  Height: 5\' 6"  (1.676 m)    Body mass index  is 25.82 kg/m.  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  I spoke with Dr. Allyson Sabal his dermatologist and the patient does not he need any further excision of the squamous cell cancer this has been fully treated.   PLAN: Repair of right frontotemporal pseudoaneurysm in the operating room on September 23, 2019.  Risk benefits possible complications of procedure details were discussed the patient today include but not limited to mainly bleeding and infection.  Patient is on warfarin.  We will stop this today.  I did inform him that his INR may still be slightly elevated on Friday and if this was the case we would give him fresh frozen plasma.  We will bring him early for his INR check for this.  We will then proceed with his operation about midday on Friday after his INR has been corrected if necessary.  Our plan will be to bridge him with Lovenox most likely postoperatively until his INR is back over 2 for his mitral valve.   Ruta Hinds, MD [facility] Office: [phone] Pager: [phone]

## 2019-09-22 ENCOUNTER — Encounter (HOSPITAL_COMMUNITY): Payer: Self-pay | Admitting: Vascular Surgery

## 2019-09-22 ENCOUNTER — Other Ambulatory Visit: Payer: Self-pay

## 2019-09-22 ENCOUNTER — Encounter (HOSPITAL_COMMUNITY): Payer: Self-pay | Admitting: Student

## 2019-09-22 ENCOUNTER — Emergency Department (HOSPITAL_COMMUNITY)
Admission: EM | Admit: 2019-09-22 | Discharge: 2019-09-22 | Disposition: A | Discharge status: Home / Self Care | Payer: Medicare Other | Attending: Emergency Medicine | Admitting: Emergency Medicine

## 2019-09-22 DIAGNOSIS — W228XXA Striking against or struck by other objects, initial encounter: Secondary | ICD-10-CM | POA: Diagnosis not present

## 2019-09-22 DIAGNOSIS — I729 Aneurysm of unspecified site: Secondary | ICD-10-CM | POA: Diagnosis not present

## 2019-09-22 DIAGNOSIS — S0083XA Contusion of other part of head, initial encounter: Secondary | ICD-10-CM | POA: Insufficient documentation

## 2019-09-22 DIAGNOSIS — S0990XA Unspecified injury of head, initial encounter: Secondary | ICD-10-CM

## 2019-09-22 DIAGNOSIS — Z79899 Other long term (current) drug therapy: Secondary | ICD-10-CM | POA: Insufficient documentation

## 2019-09-22 DIAGNOSIS — Y93E1 Activity, personal bathing and showering: Secondary | ICD-10-CM | POA: Insufficient documentation

## 2019-09-22 DIAGNOSIS — Z87891 Personal history of nicotine dependence: Secondary | ICD-10-CM | POA: Diagnosis not present

## 2019-09-22 DIAGNOSIS — Y92002 Bathroom of unspecified non-institutional (private) residence single-family (private) house as the place of occurrence of the external cause: Secondary | ICD-10-CM | POA: Insufficient documentation

## 2019-09-22 DIAGNOSIS — Z7901 Long term (current) use of anticoagulants: Secondary | ICD-10-CM | POA: Diagnosis not present

## 2019-09-22 DIAGNOSIS — I10 Essential (primary) hypertension: Secondary | ICD-10-CM | POA: Diagnosis not present

## 2019-09-22 DIAGNOSIS — Y999 Unspecified external cause status: Secondary | ICD-10-CM | POA: Insufficient documentation

## 2019-09-22 NOTE — ED Provider Notes (Signed)
[facility] EMERGENCY DEPARTMENT Provider Note   CSN: [id number] Arrival date & time: 09/22/19  1323     History Chief Complaint  Patient presents with  . Head Injury    Nathan Mcdaniel is a 79 y.o. male with a history of prior stroke, hypertension, hypercholesterolemia, mobitz type 1, anxiety, & depression who presents to the ED with bleeding from right forehead after injury which occurred @ 13:00 and is now resolved.  Patient had a shave biopsy to the right forehead with positive margins 08/10/2019, had subsequent reexcision with no further disease by his dermatologist Dr. Berenice Primas; however, following the second procedure he developed a pulsatile mass to the biopsy site.  There were issues with controlling of bleeding to this area by his dermatologist.  He was subsequently seen by vascular surgeon Dr. 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   He states that today he was stepping out of the shower when the shower door brushed/scraped his pseudoaneurysm resulting in bleeding.  He and his wife quickly applied pressure with subsequent resolution of bleeding.  No other alleviating or aggravating factors.  Given he is on Coumadin he came to the emergency department to be evaluated.  He is holding his Coumadin currently and did not take it yesterday due to his plan for surgery tomorrow.  He denies loss of consciousness, confusion, numbness, tingling, weakness, dizziness, visual disturbance, vomiting, or seizure activity.  His wife at bedside confirms he is at his mental status baseline.  HPI     Past Medical History:  Diagnosis Date  . Abnormal ECG   . Cancer ([place])    skin  . Generalized anxiety disorder   . Headache   . Hypercholesterolemia   . Hypertension   . Major depressive disorder   . 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 Mitral insufficiency   . Mobitz type 1 second degree atrioventricular block   . Palpitations   .  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 Stroke [facility])    " 2 mild" per spouse  . Wears glasses     Patient Active Problem List   Diagnosis Date Noted  . 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 Major depressive disorder   . Generalized anxiety disorder   . Mobitz type 1 second degree atrioventricular block   . Abnormal ECG   . 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 Hypertension   . Palpitations   . Mitral insufficiency   . Hypercholesterolemia     Past Surgical History:  Procedure Laterality Date  . CATARACT EXTRACTION, BILATERAL    . CHOLECYSTECTOMY    . COLON SURGERY     with diversion and resection  . MITRAL VALVE REPLACEMENT  2002   with a st.jude mechanical valve  . PROSTATE BIOPSY         Family History  Problem Relation Age of Onset  . Heart failure Mother   . Hypertension Mother   . Lung cancer Sister     Social History   Tobacco Use  . Smoking status: Former Smoker    Packs/day: 0.80    Years: 18.00    Pack years: 14.40    Types: Cigarettes    Quit date: 08/05/1979    Years since quitting: 40.1  . Smokeless tobacco: Never Used  Substance Use Topics  . Alcohol use: Yes    Comment: occasional glass of wine  .  Drug use: No    Home Medications Prior to Admission medications   Medication Sig Start Date End Date Taking? Authorizing Provider  acetaminophen (TYLENOL) 325 MG tablet Take 650 mg by mouth every 6 (six) hours as needed for moderate pain or headache.    [provider]  buPROPion (WELLBUTRIN XL) 150 MG 24 hr tablet Take 150 mg by mouth in the morning and at bedtime.     [provider]  diclofenac Sodium (VOLTAREN) 1 % GEL Apply 1 application topically 4 (four) times daily as needed (pain).    [provider]  doxycycline (VIBRAMYCIN) 100 MG capsule Take 1 capsule (100 mg total) by mouth 2 (two) times daily. Patient not taking: Reported on 09/22/2019 09/03/19    Tedd Sias, PA  fluticasone (FLONASE) 50 MCG/ACT nasal spray Place 1-2 sprays into both nostrils daily as needed for allergies.  01/26/15   [provider]  LORazepam (ATIVAN) 0.5 MG tablet Take 0.5 mg by mouth every 6 (six) hours as needed for anxiety.     [provider]  Multiple Vitamin (MULTIVITAMIN WITH MINERALS) TABS tablet Take 1 tablet by mouth daily.    [provider]  propranolol (INDERAL) 10 MG tablet Take 10 mg by mouth 3 (three) times daily.    [provider]  sertraline (ZOLOFT) 50 MG tablet Take 50 mg by mouth every evening.  08/28/14   [provider]  Tamsulosin HCl (FLOMAX) 0.4 MG CAPS Take 0.4 mg by mouth at bedtime.     [provider]  warfarin (COUMADIN) 3 MG tablet TAKE 1/2 - 1 TABLET DAILY OR AS DIRECTED BY COUMADIN CLINIC Patient taking differently: Take 1.5-3 mg by mouth See admin instructions. Take 3 mg in the evening once daily except on Friday evening take 1.5 mg 09/21/17   Martinique, Peter M, MD    Allergies    Donepezil hcl and Memantine hcl  Review of Systems   Review of Systems  Constitutional: Negative for chills and fever.  Eyes: Negative for visual disturbance.  Respiratory: Negative for shortness of breath.   Cardiovascular: Negative for chest pain.  Gastrointestinal: Negative for abdominal pain and vomiting.  Skin: Positive for wound.  Neurological: Negative for dizziness, seizures, syncope, facial asymmetry, weakness, numbness and headaches.  All other systems reviewed and are negative.   Physical Exam Updated Vital Signs BP (!) 158/70   Pulse (!) 56   Temp 98.3 F (36.8 C) (Oral)   Resp 20   SpO2 99%   Physical Exam Vitals and nursing note reviewed.  Constitutional:      General: He is not in acute distress.    Appearance: He is well-developed. He is not toxic-appearing.  HENT:     Head:     Comments: No raccoon eyes or battle sign.  R forehead: Small mass to the R forehead  which is mobile. Overlying eschar with some mild ecchymosis. Sutures in place. No erythema/warmth/purulence. No active bleeding. Not tender to palpation.     Ears:     Comments: No hemotympanum.  Eyes:     General:        Right eye: No discharge.        Left eye: No discharge.     Conjunctiva/sclera: Conjunctivae normal.  Cardiovascular:     Rate and Rhythm: Normal rate and regular rhythm.  Pulmonary:     Effort: Pulmonary effort is normal. No respiratory distress.     Breath sounds: Normal breath sounds. No  wheezing, rhonchi or rales.  Abdominal:     General: There is no distension.     Palpations: Abdomen is soft.     Tenderness: There is no abdominal tenderness.  Musculoskeletal:     Cervical back: Neck supple.  Skin:    General: Skin is warm and dry.     Findings: No rash.  Neurological:     Mental Status: He is alert.     Comments: Clear speech.  CN III through XII grossly intact.  Sensation gross intact bilateral upper and lower extremities.  5-5 symmetric grip strength.  5-5 strength with plantar dorsiflexion bilaterally.  Patient is ambulatory.  Psychiatric:        Behavior: Behavior normal.     ED Results / Procedures / Treatments   Labs (all labs ordered are listed, but only abnormal results are displayed) Labs Reviewed - No data to display  EKG None  Radiology No results found.  Procedures Procedures (including critical care time)  Medications Ordered in ED Medications - No data to display  ED Course  I have reviewed the triage vital signs and the nursing notes.  Pertinent labs & imaging results that were available during my care of the patient were reviewed by me and considered in my medical decision making (see chart for details).    MDM Rules/Calculators/A&P                      Patient presents to the emergency department status post scrape/crush injury to right forehead pseudoaneurysm which resulted in bleeding which is now resolved.  Patient  nontoxic, resting comfortably, vitals with elevated blood pressure, doubt HTN emergency.  This seems to have been a fairly superficial injury as opposed to a significant head trauma.  Normal neurologic exam, not currently on his blood thinner for preop versus, doubt acute intracranial bleed.  Superficial bleeding now resolved.  Patient appears appropriate for discharge home with plan to follow-up with Dr. Oneida Alar tomorrow for his surgery. I discussed treatment plan, need for follow-up, and return precautions with the patient & his wife @ bedside. Provided opportunity for questions, patient & his wife confirmed understanding and are in agreement with plan.   Findings and plan of care discussed with supervising physician Dr. Sedonia Small who has evaluated the patient & is in agreement.   Final Clinical Impression(s) / ED Diagnoses Final diagnoses:  Injury of head, initial encounter    Rx / DC Orders ED Discharge Orders    None       Amaryllis Dyke, PA-C 09/22/19 1508    Maudie Flakes, MD 09/25/19 [phone]

## 2019-09-22 NOTE — Progress Notes (Signed)
Anesthesia Chart Review: Same day workup  Pt follows with cardiology for history of Mechanical MVR in 2002. He was last seen by Dr. Martinique 07/15/18. Per note, "Good valve click on exam. Echo 123456 OK.  Patient is therapeutic on his Coumadin. We will continue with his current therapy. SBE prophylaxis. Chronic anticoagulation. Adjusted per primary care. History of Mobitz type 1 second degree AV block.  Resolved with reduction in beta blocker dose. On Inderal now and HR looks OK."   Coumadin managed by PCP Dr. Melford Aase. Last seen 08/17/19, doing well at that time. PCP notes also mention history of paroxysmal afib. Last EKG in Epic 12/19 shows sinus rhythm.  Pt has developed right frontotemporal pseudoaneurysm after recent dermatologic biopsy and now has pulsatile mass on his forehead. Per Dr. Oneida Alar' note 09/21/19, "Patient is on warfarin.  We will stop this today.  I did inform him that his INR may still be slightly elevated on Friday and if this was the case we would give him fresh frozen plasma.  We will bring him early for his INR check for this.  We will then proceed with his operation about midday on Friday after his INR has been corrected if necessary. Our plan will be to bridge him with Lovenox most likely postoperatively until his INR is back over 2 for his mitral valve."  Will need DOS labs, EKG, and eval.  EKG 07/15/18 (Read per Dr. Doug Sou OV note): NSR with first degree AV block, sinus arrhythmia. Diffuse T wave inversion.  Nuclear stress 08/31/2013: Overall Impression:  Normal stress nuclear study.  LV Ejection Fraction: 49%.  LV Wall Motion:  Paradoxican septal motion, otherwise no regional wall motion abnormalities, low normal LVEF.  TTE 08/23/13: - Left ventricle: The cavity size was mildly dilated. Wall  thickness was increased in a pattern of moderate LVH.  Systolic function was normal. The estimated ejection  fraction was in the range of 50% to 55%.  - Aortic valve: Trivial  regurgitation.  - Mitral valve: Normal appearing mechanical MVR with no peri  valvular leak  - Left atrium: The atrium was moderately to severely  dilated.  - Right atrium: The atrium was mildly dilated.  - Atrial septum: No defect or patent foramen ovale was  identified.   Wynonia Musty [facility] Short Stay Center/Anesthesiology Phone [phone] 09/22/2019 8:18 AM

## 2019-09-22 NOTE — ED Notes (Signed)
Patient verbalizes understanding of discharge instructions. Opportunity for questioning and answers were provided. Armband removed by staff, pt discharged from ED.  

## 2019-09-22 NOTE — ED Triage Notes (Signed)
Pt presents to the ED after hitting his head. Per patient's wife patient has a pseudoaneurysm that is scheduled to be repaired tomorrow. He hit it on the shower door this morning and it started bleeding. Pt does report a slight headache on the right side of his head that he describes as "tight" Bleeding is controlled at this time.

## 2019-09-22 NOTE — Discharge Instructions (Addendum)
You were seen in the emergency department today for an injury to your pseudoaneurysm to your forehead.  In the emergency department the bleeding is well controlled.  Please follow-up with your surgeon Dr. Jefferson Fuel tomorrow as scheduled.  Return to the emergency department for new or worsening symptoms including but not limited to recurrence of bleeding, vomiting, abnormal behavior, numbness, weakness, dizziness, seizure activity, passing out, or any other concerns.

## 2019-09-22 NOTE — Progress Notes (Signed)
SDW-Pre-op call completed by pt spouse, [name]. Spouse denies that pt C/O SOB and chest pain. Spouse stated that pt is under the care of Dr. Martinique, Cardiology and Dr. Melford Aase, PCP. Spouse denies that pt had a chest x ray and EKG in the last year. Spouse denies recent labs. Spouse stated that pt last dose of Coumadin was 09/20/19 as advised by MD. Spouse made aware to have pt stop taking  vitamins, fish oil and herbal medications. Do not take any NSAIDs ie: Ibuprofen, Advil, Naproxen (Aleve), Motrin, BC and Goody Powder. Spouse reminded to have pt quarantine. Spouse verbalized understanding of all pre-op instructions. See PA, Anesthesiology, note.

## 2019-09-22 NOTE — Anesthesia Preprocedure Evaluation (Addendum)
Anesthesia Evaluation  Patient identified by MRN, date of birth, ID band Patient awake    Reviewed: Allergy & Precautions, NPO status , Patient's Chart, lab work & pertinent test results  Airway Mallampati: II  TM Distance: >3 FB Neck ROM: Full    Dental   Pulmonary former smoker,    breath sounds clear to auscultation       Cardiovascular hypertension, Pt. on medications and Pt. on home beta blockers + dysrhythmias  Rhythm:Regular Rate:Normal  S/p mechanical mitral valve. Normal EF on 2015 echo.   Neuro/Psych Anxiety Depression CVA    GI/Hepatic negative GI ROS, Neg liver ROS,   Endo/Other  negative endocrine ROS  Renal/GU negative Renal ROS     Musculoskeletal   Abdominal   Peds  Hematology negative hematology ROS (+)   Anesthesia Other Findings   Reproductive/Obstetrics                            Lab Results  Component Value Date   HGB 13.3 09/23/2019   HCT 39.0 09/23/2019   Lab Results  Component Value Date   CREATININE 1.00 09/23/2019   BUN 21 09/23/2019   NA 139 09/23/2019   K 3.7 09/23/2019   CL 104 09/23/2019    Anesthesia Physical Anesthesia Plan  ASA: III  Anesthesia Plan: General   Post-op Pain Management:    Induction: Intravenous  PONV Risk Score and Plan: 1 and Ondansetron, Treatment may vary due to age or medical condition and Dexamethasone  Airway Management Planned: LMA  Additional Equipment:   Intra-op Plan:   Post-operative Plan: Extubation in OR  Informed Consent: I have reviewed the patients History and Physical, chart, labs and discussed the procedure including the risks, benefits and alternatives for the proposed anesthesia with the patient or authorized representative who has indicated his/her understanding and acceptance.       Plan Discussed with: CRNA and Surgeon  Anesthesia Plan Comments: ( Pt follows with cardiology for history of  Mechanical MVR in 2002. He was last seen by Dr. Martinique 07/15/18. Per note, "Good valve click on exam. Echo 123456 OK.  Patient is therapeutic on his Coumadin. We will continue with his current therapy. SBE prophylaxis. Chronic anticoagulation. Adjusted per primary care. History of Mobitz type 1 second degree AV block.  Resolved with reduction in beta blocker dose. On Inderal now and HR looks OK."   Coumadin managed by PCP Dr. Melford Aase. Last seen 08/17/19, doing well at that time. PCP notes also mention history of paroxysmal afib. Last EKG in Epic 12/19 shows sinus rhythm.  Pt has developed right frontotemporal pseudoaneurysm after recent dermatologic biopsy and now has pulsatile mass on his forehead. Per Dr. Oneida Alar' note 09/21/19, "Patient is on warfarin.  We will stop this today.  I did inform him that his INR may still be slightly elevated on Friday and if this was the case we would give him fresh frozen plasma.  We will bring him early for his INR check for this.  We will then proceed with his operation about midday on Friday after his INR has been corrected if necessary. Our plan will be to bridge him with Lovenox most likely postoperatively until his INR is back over 2 for his mitral valve."  Will need DOS labs, EKG, and eval.  EKG 07/15/18 (Read per Dr. Doug Sou OV note): NSR with first degree AV block, sinus arrhythmia. Diffuse T wave inversion.  Nuclear stress 08/31/2013:  Overall Impression:  Normal stress nuclear study.  LV Ejection Fraction: 49%.  LV Wall Motion:  Paradoxican septal motion, otherwise no regional wall motion abnormalities, low normal LVEF.  TTE 08/23/13: - Left ventricle: The cavity size was mildly dilated. Wall  thickness was increased in a pattern of moderate LVH.  Systolic function was normal. The estimated ejection  fraction was in the range of 50% to 55%.  - Aortic valve: Trivial regurgitation.  - Mitral valve: Normal appearing mechanical MVR with no peri  valvular leak   - Left atrium: The atrium was moderately to severely  dilated.  - Right atrium: The atrium was mildly dilated.  - Atrial septum: No defect or patent foramen ovale was  identified. )      Anesthesia Quick Evaluation

## 2019-09-23 ENCOUNTER — Other Ambulatory Visit: Payer: Self-pay

## 2019-09-23 ENCOUNTER — Ambulatory Visit (HOSPITAL_COMMUNITY): Payer: Medicare Other | Admitting: Physician Assistant

## 2019-09-23 ENCOUNTER — Encounter (HOSPITAL_COMMUNITY): Payer: Self-pay | Admitting: Vascular Surgery

## 2019-09-23 ENCOUNTER — Encounter (HOSPITAL_COMMUNITY)
Admission: RE | Disposition: A | Discharge status: Home / Self Care | Payer: Self-pay | Source: Home / Self Care | Attending: Vascular Surgery

## 2019-09-23 ENCOUNTER — Ambulatory Visit (HOSPITAL_COMMUNITY)
Admission: RE | Admit: 2019-09-23 | Discharge: 2019-09-23 | Disposition: A | Discharge status: Home / Self Care | Payer: Medicare Other | Attending: Vascular Surgery | Admitting: Vascular Surgery

## 2019-09-23 DIAGNOSIS — Y9389 Activity, other specified: Secondary | ICD-10-CM | POA: Diagnosis not present

## 2019-09-23 DIAGNOSIS — T81718A Complication of other artery following a procedure, not elsewhere classified, initial encounter: Secondary | ICD-10-CM | POA: Insufficient documentation

## 2019-09-23 DIAGNOSIS — Z20822 Contact with and (suspected) exposure to covid-19: Secondary | ICD-10-CM | POA: Insufficient documentation

## 2019-09-23 DIAGNOSIS — I441 Atrioventricular block, second degree: Secondary | ICD-10-CM | POA: Insufficient documentation

## 2019-09-23 DIAGNOSIS — Z7901 Long term (current) use of anticoagulants: Secondary | ICD-10-CM | POA: Insufficient documentation

## 2019-09-23 DIAGNOSIS — R002 Palpitations: Secondary | ICD-10-CM | POA: Diagnosis not present

## 2019-09-23 DIAGNOSIS — Z79899 Other long term (current) drug therapy: Secondary | ICD-10-CM | POA: Insufficient documentation

## 2019-09-23 DIAGNOSIS — F329 Major depressive disorder, single episode, unspecified: Secondary | ICD-10-CM | POA: Diagnosis not present

## 2019-09-23 DIAGNOSIS — Z888 Allergy status to other drugs, medicaments and biological substances status: Secondary | ICD-10-CM | POA: Diagnosis not present

## 2019-09-23 DIAGNOSIS — I10 Essential (primary) hypertension: Secondary | ICD-10-CM | POA: Diagnosis not present

## 2019-09-23 DIAGNOSIS — Z801 Family history of malignant neoplasm of trachea, bronchus and lung: Secondary | ICD-10-CM | POA: Diagnosis not present

## 2019-09-23 DIAGNOSIS — E7800 Pure hypercholesterolemia, unspecified: Secondary | ICD-10-CM | POA: Insufficient documentation

## 2019-09-23 DIAGNOSIS — X58XXXA Exposure to other specified factors, initial encounter: Secondary | ICD-10-CM | POA: Diagnosis not present

## 2019-09-23 DIAGNOSIS — F419 Anxiety disorder, unspecified: Secondary | ICD-10-CM | POA: Insufficient documentation

## 2019-09-23 DIAGNOSIS — Z9049 Acquired absence of other specified parts of digestive tract: Secondary | ICD-10-CM | POA: Diagnosis not present

## 2019-09-23 DIAGNOSIS — I728 Aneurysm of other specified arteries: Secondary | ICD-10-CM | POA: Insufficient documentation

## 2019-09-23 DIAGNOSIS — Z952 Presence of prosthetic heart valve: Secondary | ICD-10-CM | POA: Diagnosis not present

## 2019-09-23 DIAGNOSIS — G3184 Mild cognitive impairment, so stated: Secondary | ICD-10-CM | POA: Diagnosis not present

## 2019-09-23 DIAGNOSIS — Z8673 Personal history of transient ischemic attack (TIA), and cerebral infarction without residual deficits: Secondary | ICD-10-CM | POA: Diagnosis not present

## 2019-09-23 DIAGNOSIS — R519 Headache, unspecified: Secondary | ICD-10-CM

## 2019-09-23 DIAGNOSIS — Z87891 Personal history of nicotine dependence: Secondary | ICD-10-CM | POA: Insufficient documentation

## 2019-09-23 DIAGNOSIS — C44329 Squamous cell carcinoma of skin of other parts of face: Secondary | ICD-10-CM | POA: Insufficient documentation

## 2019-09-23 DIAGNOSIS — Z8249 Family history of ischemic heart disease and other diseases of the circulatory system: Secondary | ICD-10-CM | POA: Diagnosis not present

## 2019-09-23 HISTORY — DX: Headache, unspecified: R51.9

## 2019-09-23 HISTORY — DX: Malignant (primary) neoplasm, unspecified: C80.1

## 2019-09-23 HISTORY — DX: Aneurysm of unspecified site: I72.9

## 2019-09-23 HISTORY — PX: FALSE ANEURYSM REPAIR: SHX5152

## 2019-09-23 LAB — RESPIRATORY PANEL BY RT PCR (FLU A&B, COVID)
Influenza A by PCR: NEGATIVE
Influenza B by PCR: NEGATIVE
SARS Coronavirus 2 by RT PCR: NEGATIVE

## 2019-09-23 LAB — POCT I-STAT, CHEM 8
BUN: 21 mg/dL (ref 8–23)
Calcium, Ion: 1.08 mmol/L — ABNORMAL LOW (ref 1.15–1.40)
Chloride: 104 mmol/L (ref 98–111)
Creatinine, Ser: 1 mg/dL (ref 0.61–1.24)
Glucose, Bld: 100 mg/dL — ABNORMAL HIGH (ref 70–99)
HCT: 39 % (ref 39.0–52.0)
Hemoglobin: 13.3 g/dL (ref 13.0–17.0)
Potassium: 3.7 mmol/L (ref 3.5–5.1)
Sodium: 139 mmol/L (ref 135–145)
TCO2: 30 mmol/L (ref 22–32)

## 2019-09-23 LAB — PROTIME-INR
INR: 1.4 — ABNORMAL HIGH (ref 0.8–1.2)
Prothrombin Time: 16.7 seconds — ABNORMAL HIGH (ref 11.4–15.2)

## 2019-09-23 SURGERY — REPAIR, PSEUDOANEURYSM
Anesthesia: Monitor Anesthesia Care | Site: Head

## 2019-09-23 MED ORDER — CEFAZOLIN SODIUM-DEXTROSE 2-4 GM/100ML-% IV SOLN
2.0000 g | INTRAVENOUS | Status: AC
  Administered 2019-09-23: 11:00:00 2 g via INTRAVENOUS
  Filled 2019-09-23: qty 100

## 2019-09-23 MED ORDER — FENTANYL CITRATE (PF) 250 MCG/5ML IJ SOLN
INTRAMUSCULAR | Status: AC
  Filled 2019-09-23: qty 5

## 2019-09-23 MED ORDER — 0.9 % SODIUM CHLORIDE (POUR BTL) OPTIME
TOPICAL | Status: DC | PRN
  Administered 2019-09-23: 1000 mL

## 2019-09-23 MED ORDER — SODIUM CHLORIDE 0.9 % IV SOLN
INTRAVENOUS | Status: AC
  Filled 2019-09-23: qty 1.2

## 2019-09-23 MED ORDER — OXYCODONE-ACETAMINOPHEN 5-325 MG PO TABS
ORAL_TABLET | ORAL | Status: AC
  Filled 2019-09-23: qty 1

## 2019-09-23 MED ORDER — SODIUM CHLORIDE 0.9 % IV SOLN: INTRAVENOUS | Status: DC

## 2019-09-23 MED ORDER — CHLORHEXIDINE GLUCONATE 4 % EX LIQD: 60.0000 mL | Freq: Once | CUTANEOUS | Status: DC

## 2019-09-23 MED ORDER — PROPRANOLOL HCL 10 MG PO TABS
10.0000 mg | ORAL_TABLET | Freq: Once | ORAL | Status: AC
  Administered 2019-09-23: 10 mg via ORAL
  Filled 2019-09-23: qty 1

## 2019-09-23 MED ORDER — FENTANYL CITRATE (PF) 100 MCG/2ML IJ SOLN
25.0000 ug | INTRAMUSCULAR | Status: DC | PRN
  Administered 2019-09-23: 12:00:00 50 ug via INTRAVENOUS

## 2019-09-23 MED ORDER — PHENYLEPHRINE HCL-NACL 10-0.9 MG/250ML-% IV SOLN
INTRAVENOUS | Status: DC | PRN
  Administered 2019-09-23: 20 ug/min via INTRAVENOUS

## 2019-09-23 MED ORDER — ACETAMINOPHEN 500 MG PO TABS
1000.0000 mg | ORAL_TABLET | Freq: Once | ORAL | Status: AC
  Administered 2019-09-23: 1000 mg via ORAL
  Filled 2019-09-23: qty 2

## 2019-09-23 MED ORDER — ENOXAPARIN SODIUM 80 MG/0.8ML ~~LOC~~ SOLN: 80.0000 mg | Freq: Two times a day (BID) | SUBCUTANEOUS | 0 refills | Status: DC

## 2019-09-23 MED ORDER — FENTANYL CITRATE (PF) 100 MCG/2ML IJ SOLN
INTRAMUSCULAR | Status: AC
  Administered 2019-09-23: 50 ug via INTRAVENOUS
  Filled 2019-09-23: qty 2

## 2019-09-23 MED ORDER — FENTANYL CITRATE (PF) 250 MCG/5ML IJ SOLN
INTRAMUSCULAR | Status: DC | PRN
  Administered 2019-09-23 (×3): 50 ug via INTRAVENOUS

## 2019-09-23 MED ORDER — HYDROCODONE-ACETAMINOPHEN 5-325 MG PO TABS: 1.0000 | ORAL_TABLET | Freq: Four times a day (QID) | ORAL | 0 refills | Status: DC | PRN

## 2019-09-23 MED ORDER — LIDOCAINE 2% (20 MG/ML) 5 ML SYRINGE
INTRAMUSCULAR | Status: DC | PRN
  Administered 2019-09-23: 60 mg via INTRAVENOUS

## 2019-09-23 MED ORDER — ONDANSETRON HCL 4 MG/2ML IJ SOLN
INTRAMUSCULAR | Status: AC
  Filled 2019-09-23: qty 2

## 2019-09-23 MED ORDER — HYDROCODONE-ACETAMINOPHEN 5-325 MG PO TABS
1.0000 | ORAL_TABLET | Freq: Once | ORAL | Status: AC
  Administered 2019-09-23: 1 via ORAL

## 2019-09-23 MED ORDER — HYDROCODONE-ACETAMINOPHEN 5-325 MG PO TABS
ORAL_TABLET | ORAL | Status: AC
  Filled 2019-09-23: qty 1

## 2019-09-23 MED ORDER — LIDOCAINE-EPINEPHRINE 1 %-1:100000 IJ SOLN
INTRAMUSCULAR | Status: AC
  Filled 2019-09-23: qty 1

## 2019-09-23 MED ORDER — LACTATED RINGERS IV SOLN: INTRAVENOUS | Status: DC

## 2019-09-23 MED ORDER — ONDANSETRON HCL 4 MG/2ML IJ SOLN
INTRAMUSCULAR | Status: DC | PRN
  Administered 2019-09-23: 4 mg via INTRAVENOUS

## 2019-09-23 MED ORDER — PROPOFOL 10 MG/ML IV BOLUS
INTRAVENOUS | Status: DC | PRN
  Administered 2019-09-23: 50 mg via INTRAVENOUS
  Administered 2019-09-23: 100 mg via INTRAVENOUS

## 2019-09-23 MED ORDER — PROPOFOL 1000 MG/100ML IV EMUL
INTRAVENOUS | Status: AC
  Filled 2019-09-23: qty 100

## 2019-09-23 MED ORDER — LACTATED RINGERS IV SOLN: INTRAVENOUS | Status: DC | PRN

## 2019-09-23 MED ORDER — LIDOCAINE HCL (PF) 1 % IJ SOLN
INTRAMUSCULAR | Status: AC
  Filled 2019-09-23: qty 30

## 2019-09-23 SURGICAL SUPPLY — 58 items
ADH SKN CLS APL DERMABOND .7 (GAUZE/BANDAGES/DRESSINGS) ×1
APL PRP STRL LF DISP 70% ISPRP (MISCELLANEOUS) ×2
BANDAGE ESMARK 6X9 LF (GAUZE/BANDAGES/DRESSINGS) IMPLANT
BLADE SURG 10 STRL SS (BLADE) ×1 IMPLANT
BNDG CMPR 9X6 STRL LF SNTH (GAUZE/BANDAGES/DRESSINGS)
BNDG ESMARK 6X9 LF (GAUZE/BANDAGES/DRESSINGS)
CANISTER SUCT 3000ML PPV (MISCELLANEOUS) ×2 IMPLANT
CANNULA VESSEL 3MM 2 BLNT TIP (CANNULA) IMPLANT
CHLORAPREP W/TINT 26 (MISCELLANEOUS) ×2 IMPLANT
CLIP VESOCCLUDE MED 24/CT (CLIP) ×2 IMPLANT
CLIP VESOCCLUDE MED 6/CT (CLIP) ×1 IMPLANT
CLIP VESOCCLUDE SM WIDE 24/CT (CLIP) ×2 IMPLANT
CLIP VESOCCLUDE SM WIDE 6/CT (CLIP) ×1 IMPLANT
CONT SPEC 4OZ CLIKSEAL STRL BL (MISCELLANEOUS) ×1 IMPLANT
COVER WAND RF STERILE (DRAPES) ×1 IMPLANT
CUFF TOURN SGL QUICK 18X4 (TOURNIQUET CUFF) IMPLANT
CUFF TOURN SGL QUICK 24 (TOURNIQUET CUFF)
CUFF TOURN SGL QUICK 34 (TOURNIQUET CUFF)
CUFF TOURN SGL QUICK 42 (TOURNIQUET CUFF) IMPLANT
CUFF TRNQT CYL 24X4X16.5-23 (TOURNIQUET CUFF) IMPLANT
CUFF TRNQT CYL 34X4.125X (TOURNIQUET CUFF) IMPLANT
DERMABOND ADVANCED (GAUZE/BANDAGES/DRESSINGS) ×1
DERMABOND ADVANCED .7 DNX12 (GAUZE/BANDAGES/DRESSINGS) ×1 IMPLANT
DRAIN SNY 10X20 3/4 PERF (WOUND CARE) IMPLANT
DRAPE HALF SHEET 40X57 (DRAPES) ×1 IMPLANT
DRAPE ORTHO SPLIT 87X125 STRL (DRAPES) ×1 IMPLANT
ELECT CAUTERY BLADE 6.4 (BLADE) ×1 IMPLANT
ELECT REM PT RETURN 9FT ADLT (ELECTROSURGICAL) ×2
ELECTRODE REM PT RTRN 9FT ADLT (ELECTROSURGICAL) ×1 IMPLANT
EVACUATOR SILICONE 100CC (DRAIN) IMPLANT
GAUZE SPONGE 2X2 8PLY STRL LF (GAUZE/BANDAGES/DRESSINGS) IMPLANT
GLOVE BIO SURGEON STRL SZ7.5 (GLOVE) ×2 IMPLANT
GOWN STRL REUS W/ TWL LRG LVL3 (GOWN DISPOSABLE) ×3 IMPLANT
GOWN STRL REUS W/TWL LRG LVL3 (GOWN DISPOSABLE) ×6
KIT BASIN OR (CUSTOM PROCEDURE TRAY) ×2 IMPLANT
KIT TURNOVER KIT B (KITS) ×2 IMPLANT
NS IRRIG 1000ML POUR BTL (IV SOLUTION) ×3 IMPLANT
PACK GENERAL/GYN (CUSTOM PROCEDURE TRAY) ×1 IMPLANT
PACK PERIPHERAL VASCULAR (CUSTOM PROCEDURE TRAY) ×1 IMPLANT
PAD ARMBOARD 7.5X6 YLW CONV (MISCELLANEOUS) ×2 IMPLANT
SPONGE GAUZE 2X2 STER 10/PKG (GAUZE/BANDAGES/DRESSINGS) ×1
SPONGE INTESTINAL PEANUT (DISPOSABLE) ×1 IMPLANT
SPONGE SURGIFOAM ABS GEL 100 (HEMOSTASIS) IMPLANT
STAPLER VISISTAT 35W (STAPLE) IMPLANT
SUT PROLENE 5 0 C 1 24 (SUTURE) ×1 IMPLANT
SUT PROLENE 5 0 CC 1 (SUTURE) ×2 IMPLANT
SUT PROLENE 5 0 CC1 (SUTURE) ×1 IMPLANT
SUT PROLENE 6 0 CC (SUTURE) ×2 IMPLANT
SUT SILK 2 0 PERMA HAND 18 BK (SUTURE) IMPLANT
SUT VIC AB 2-0 CTX 36 (SUTURE) ×1 IMPLANT
SUT VIC AB 3-0 SH 27 (SUTURE)
SUT VIC AB 3-0 SH 27X BRD (SUTURE) ×1 IMPLANT
SUT VIC AB 4-0 PS2 18 (SUTURE) ×1 IMPLANT
TAPE CLOTH SURG 4X10 WHT LF (GAUZE/BANDAGES/DRESSINGS) ×1 IMPLANT
TOWEL GREEN STERILE (TOWEL DISPOSABLE) ×2 IMPLANT
TRAY FOLEY MTR SLVR 16FR STAT (SET/KITS/TRAYS/PACK) ×1 IMPLANT
UNDERPAD 30X30 (UNDERPADS AND DIAPERS) ×2 IMPLANT
WATER STERILE IRR 1000ML POUR (IV SOLUTION) ×2 IMPLANT

## 2019-09-23 NOTE — Anesthesia Postprocedure Evaluation (Signed)
Anesthesia Post Note  Patient: EUSTACIO LUTH  Procedure(s) Performed: REPAIR FALSE ANEURYSM FOREHEAD (N/A Head)     Patient location during evaluation: PACU Anesthesia Type: General Level of consciousness: awake and alert Pain management: pain level controlled Vital Signs Assessment: post-procedure vital signs reviewed and stable Respiratory status: spontaneous breathing, nonlabored ventilation, respiratory function stable and patient connected to nasal cannula oxygen Cardiovascular status: blood pressure returned to baseline and stable Postop Assessment: no apparent nausea or vomiting Anesthetic complications: no    Last Vitals:  Vitals:   09/23/19 1230 09/23/19 1241  BP: (!) 143/77 (!) 151/71  Pulse: 71 72  Resp: 12 13  Temp: 36.5 C   SpO2: 95% 94%    Last Pain:  Vitals:   09/23/19 1217  PainSc: 7                  Tiajuana Amass

## 2019-09-23 NOTE — Discharge Instructions (Signed)
Enoxaparin injection What is this medicine? ENOXAPARIN (ee nox a PA rin) is used after knee, hip, or abdominal surgeries to prevent blood clotting. It is also used to treat existing blood clots in the lungs or in the veins. This medicine may be used for other purposes; ask your health care provider or pharmacist if you have questions. COMMON BRAND NAME(S): Lovenox What should I tell my health care provider before I take this medicine? They need to know if you have any of these conditions:  bleeding disorders, hemorrhage, or hemophilia  infection of the heart or heart valves  kidney or liver disease  previous stroke  prosthetic heart valve  recent surgery or delivery of a baby  ulcer in the stomach or intestine, diverticulitis, or other bowel disease  an unusual or allergic reaction to enoxaparin, heparin, pork or pork products, other medicines, foods, dyes, or preservatives  pregnant or trying to get pregnant  breast-feeding How should I use this medicine? This medicine is for injection under the skin. It is usually given by a health-care professional. You or a family member may be trained on how to give the injections. If you are to give yourself injections, make sure you understand how to use the syringe, measure the dose if necessary, and give the injection. To avoid bruising, do not rub the site where this medicine has been injected. Do not take your medicine more often than directed. Do not stop taking except on the advice of your doctor or health care professional. Make sure you receive a puncture-resistant container to dispose of the needles and syringes once you have finished with them. Do not reuse these items. Return the container to your doctor or health care professional for proper disposal. Talk to your pediatrician regarding the use of this medicine in children. Special care may be needed. Overdosage: If you think you have taken too much of this medicine contact a poison  control center or emergency room at once. NOTE: This medicine is only for you. Do not share this medicine with others. What if I miss a dose? If you miss a dose, take it as soon as you can. If it is almost time for your next dose, take only that dose. Do not take double or extra doses. What may interact with this medicine?  aspirin and aspirin-like medicines  certain medicines that treat or prevent blood clots  dipyridamole  NSAIDs, medicines for pain and inflammation, like ibuprofen or naproxen This list may not describe all possible interactions. Give your health care provider a list of all the medicines, herbs, non-prescription drugs, or dietary supplements you use. Also tell them if you smoke, drink alcohol, or use illegal drugs. Some items may interact with your medicine. What should I watch for while using this medicine? Visit your healthcare professional for regular checks on your progress. You may need blood work done while you are taking this medicine. Your condition will be monitored carefully while you are receiving this medicine. It is important not to miss any appointments. If you are going to need surgery or other procedure, tell your healthcare professional that you are using this medicine. Using this medicine for a long time may weaken your bones and increase the risk of bone fractures. Avoid sports and activities that might cause injury while you are using this medicine. Severe falls or injuries can cause unseen bleeding. Be careful when using sharp tools or knives. Consider using an Copy. Take special care brushing or flossing your  teeth. Report any injuries, bruising, or red spots on the skin to your healthcare professional. Wear a medical ID bracelet or chain. Carry a card that describes your disease and details of your medicine and dosage times. What side effects may I notice from receiving this medicine? Side effects that you should report to your doctor or health  care professional as soon as possible:  allergic reactions like skin rash, itching or hives, swelling of the face, lips, or tongue  bone pain  signs and symptoms of bleeding such as bloody or black, tarry stools; red or dark-brown urine; spitting up blood or brown material that looks like coffee grounds; red spots on the skin; unusual bruising or bleeding from the eye, gums, or nose  signs and symptoms of a blood clot such as chest pain; shortness of breath; pain, swelling, or warmth in the leg  signs and symptoms of a stroke such as changes in vision; confusion; trouble speaking or understanding; severe headaches; sudden numbness or weakness of the face, arm or leg; trouble walking; dizziness; loss of coordination Side effects that usually do not require medical attention (report to your doctor or health care professional if they continue or are bothersome):  hair loss  pain, redness, or irritation at site where injected This list may not describe all possible side effects. Call your doctor for medical advice about side effects. You may report side effects to FDA at 1-800-FDA-1088. Where should I keep my medicine? Keep out of the reach of children. Store at room temperature between 15 and 30 degrees C (59 and 86 degrees F). Do not freeze. If your injections have been specially prepared, you may need to store them in the refrigerator. Ask your pharmacist. Throw away any unused medicine after the expiration date. NOTE: This sheet is a summary. It may not cover all possible information. If you have questions about this medicine, talk to your doctor, pharmacist, or health care provider.  2020 Elsevier/Gold Standard (2017-07-16 11:25:34)      Incision Care, Adult An incision is a cut that a doctor makes in your skin for surgery (for a procedure). Most times, these cuts are closed after surgery. Your cut from surgery may be closed with stitches (sutures), staples, skin glue, or skin tape  (adhesive strips). You may need to return to your doctor to have stitches or staples taken out. This may happen many days or many weeks after your surgery. The cut needs to be well cared for so it does not get infected. How to care for your cut Cut care   Follow instructions from your doctor about how to take care of your cut. Make sure you: ? Wash your hands with soap and water before you change your bandage (dressing). If you cannot use soap and water, use hand sanitizer. ? Change your bandage as told by your doctor. ? Leave stitches, skin glue, or skin tape in place. They may need to stay in place for 2 weeks or longer. If tape strips get loose and curl up, you may trim the loose edges. Do not remove tape strips completely unless your doctor says it is okay.  Check your cut area every day for signs of infection. Check for: ? More redness, swelling, or pain. ? More fluid or blood. ? Warmth. ? Pus or a bad smell.  Ask your doctor how to clean the cut. This may include: ? Using mild soap and water. ? Using a clean towel to pat the cut dry  after you clean it. ? Putting a cream or ointment on the cut. Do this only as told by your doctor. ? Covering the cut with a clean bandage.  Ask your doctor when you can leave the cut uncovered.  Do not take baths, swim, or use a hot tub until your doctor says it is okay. Ask your doctor if you can take showers. You may only be allowed to take sponge baths for bathing. Medicines  If you were prescribed an antibiotic medicine, cream, or ointment, take the antibiotic or put it on the cut as told by your doctor. Do not stop taking or putting on the antibiotic even if your condition gets better.  Take over-the-counter and prescription medicines only as told by your doctor. General instructions  Limit movement around your cut. This helps healing. ? Avoid straining, lifting, or exercise for the first month, or for as long as told by your doctor. ? Follow  instructions from your doctor about going back to your normal activities. ? Ask your doctor what activities are safe.  Protect your cut from the sun when you are outside for the first 6 months, or for as long as told by your doctor. Put on sunscreen around the scar or cover up the scar.  Keep all follow-up visits as told by your doctor. This is important. Contact a doctor if:  Your have more redness, swelling, or pain around the cut.  You have more fluid or blood coming from the cut.  Your cut feels warm to the touch.  You have pus or a bad smell coming from the cut.  You have a fever or shaking chills.  You feel sick to your stomach (nauseous) or you throw up (vomit).  You are dizzy.  Your stitches or staples come undone. Get help right away if:  You have a red streak coming from your cut.  Your cut bleeds through the bandage and the bleeding does not stop with gentle pressure.  The edges of your cut open up and separate.  You have very bad (severe) pain.  You have a rash.  You are confused.  You pass out (faint).  You have trouble breathing and you have a fast heartbeat. This information is not intended to replace advice given to you by your health care provider. Make sure you discuss any questions you have with your health care provider. Document Revised: 12/08/2016 Document Reviewed: 03/28/2016 Elsevier Patient Education  2020 [employer].

## 2019-09-23 NOTE — Anesthesia Procedure Notes (Signed)
Procedure Name: LMA Insertion Date/Time: 09/23/2019 10:37 AM Performed by: Amadeo Garnet, CRNA Pre-anesthesia Checklist: Patient identified, Emergency Drugs available, Suction available and Patient being monitored Patient Re-evaluated:Patient Re-evaluated prior to induction Oxygen Delivery Method: Circle system utilized Preoxygenation: Pre-oxygenation with 100% oxygen Induction Type: IV induction Ventilation: Mask ventilation without difficulty LMA: LMA inserted LMA Size: 5.0 Number of attempts: 1 Placement Confirmation: positive ETCO2 and breath sounds checked- equal and bilateral Tube secured with: Tape Dental Injury: Teeth and Oropharynx as per pre-operative assessment

## 2019-09-23 NOTE — Transfer of Care (Signed)
Immediate Anesthesia Transfer of Care Note  Patient: Nathan Mcdaniel  Procedure(s) Performed: REPAIR FALSE ANEURYSM FOREHEAD (N/A Head)  Patient Location: PACU  Anesthesia Type:General  Level of Consciousness: awake, alert  and oriented  Airway & Oxygen Therapy: Patient Spontanous Breathing  Post-op Assessment: Report given to RN, Post -op Vital signs reviewed and stable and Patient moving all extremities  Post vital signs: Reviewed and stable  Last Vitals:  Vitals Value Taken Time  BP 156/79 09/23/19 1147  Temp    Pulse 67 09/23/19 1150  Resp 14 09/23/19 1150  SpO2 98 % 09/23/19 1150  Vitals shown include unvalidated device data.  Last Pain:  Vitals:   09/23/19 0811  PainSc: 0-No pain      Patients Stated Pain Goal: 8 ([id number])  Complications: No apparent anesthesia complications

## 2019-09-23 NOTE — Op Note (Signed)
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  Margins were negative.  The patient subsequently developed a pseudoaneurysm in the right forehead area.  Operative details: After obtaining form consent, the patient was taken to the operating room.  The patient was placed in supine position operating table.  After induction of general anesthesia and placement of laryngeal mask patient's right forehead region was prepped and draped in usual sterile fashion.  An elliptical incision was made along a skin crease on the right frontotemporal region.  The incision was carried on through the subcutaneous tissues and the pseudoaneurysm cavity was entered.  There was about a 1 mm vessel which had pulsatile bleeding from it.  This was initially controlled with cautery.  However it was still oozing some so a few 6-0 Prolene sutures were placed in it.  There was good hemostasis at this point.  The subcutaneous tissues were reapproximated using a running 4-0 Vicryl suture.  The skin was then closed with interrupted 5-0 Prolene sutures.  The patient tolerated procedure well and there were no complications.  The incident sponge and needle count was correct the end of the case.  Patient was taken to recovery in stable condition.  Ruta Hinds, MD [facility] Office: [phone]

## 2019-09-23 NOTE — Interval H&P Note (Signed)
History and Physical Interval Note:  09/23/2019 7:29 AM  Nathan Mcdaniel  has presented today for surgery, with the diagnosis of PSEUDOANEURYSM OF FOREHEAD.  The various methods of treatment have been discussed with the patient and family. After consideration of risks, benefits and other options for treatment, the patient has consented to  Procedure(s): REPAIR FALSE ANEURYSM FOREHEAD (N/A) as a surgical intervention.  The patient's history has been reviewed, patient examined, no change in status, stable for surgery.  I have reviewed the patient's chart and labs.  Questions were answered to the patient's satisfaction.     Ruta Hinds

## 2019-09-26 LAB — SURGICAL PATHOLOGY

## 2019-09-28 ENCOUNTER — Telehealth (HOSPITAL_COMMUNITY): Payer: Self-pay

## 2019-09-28 NOTE — Telephone Encounter (Signed)

## 2019-09-29 ENCOUNTER — Ambulatory Visit (INDEPENDENT_AMBULATORY_CARE_PROVIDER_SITE_OTHER): Payer: Medicare Other | Admitting: Vascular Surgery

## 2019-09-29 ENCOUNTER — Encounter: Payer: Self-pay | Admitting: Vascular Surgery

## 2019-09-29 ENCOUNTER — Other Ambulatory Visit: Payer: Self-pay

## 2019-09-29 VITALS — BP 142/70 | HR 80 | Temp 97.9°F | Resp 20 | Ht 66.0 in | Wt 161.0 lb

## 2019-09-29 DIAGNOSIS — I729 Aneurysm of unspecified site: Secondary | ICD-10-CM

## 2019-09-29 DIAGNOSIS — T81718A Complication of other artery following a procedure, not elsewhere classified, initial encounter: Secondary | ICD-10-CM

## 2019-09-29 NOTE — Progress Notes (Signed)
Nathan Mcdaniel Date of Birth: [birth year]   History of Present Illness: Nathan Mcdaniel is seen for  followup MV disease. He is s/p mechanical MVR in 2002. He has a history of PACs. In January 2015 he was noted to have marked ST-T wave changes on Ecg.Echo and Nuclear stress test were done with good results. He was also noted to have 2nd degree AV block and metoprolol dose was reduced. He has since been switched to Inderal for management of a tremor.   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 This was excised by Dr Oneida Alar.  He reports he is doing well. Still has memory loss but he states it doesn't bother him much since he is mostly at home now. No bleeding on coumadin. No edema, palpitations, dizziness, chest pain, dyspnea.    Current Outpatient Medications on File Prior to Visit  Medication Sig Dispense Refill  . acetaminophen (TYLENOL) 325 MG tablet Take 650 mg by mouth every 6 (six) hours as needed for moderate pain or headache.    Marland Kitchen buPROPion (WELLBUTRIN XL) 150 MG 24 hr tablet Take 150 mg by mouth in the morning and at bedtime.     . diclofenac Sodium (VOLTAREN) 1 % GEL Apply 1 application topically 4 (four) times daily as needed (pain).    Marland Kitchen enoxaparin (LOVENOX) 80 MG/0.8ML injection Inject 0.8 mLs (80 mg total) into the skin every 12 (twelve) hours for 7 doses. 5.6 mL 0  . escitalopram (LEXAPRO) 20 MG tablet Take 20 mg by mouth daily.    . fluticasone (FLONASE) 50 MCG/ACT nasal spray Place 1-2 sprays into both nostrils daily as needed for allergies.   3  . HYDROcodone-acetaminophen (NORCO) 5-325 MG tablet Take 1 tablet by mouth every 6 (six) hours as needed for moderate pain. (Patient not taking: Reported on 09/29/2019) 5 tablet 0  . LORazepam (ATIVAN) 0.5 MG tablet Take 0.5 mg by mouth every 6 (six) hours as needed for anxiety.     . Multiple Vitamin (MULTIVITAMIN WITH MINERALS) TABS tablet Take 1 tablet by mouth  daily.    . propranolol (INDERAL) 10 MG tablet Take 10 mg by mouth 3 (three) times daily.    . sertraline (ZOLOFT) 50 MG tablet Take 50 mg by mouth every evening.     . Tamsulosin HCl (FLOMAX) 0.4 MG CAPS Take 0.4 mg by mouth at bedtime.     Marland Kitchen warfarin (COUMADIN) 3 MG tablet TAKE 1/2 - 1 TABLET DAILY OR AS DIRECTED BY COUMADIN CLINIC (Patient taking differently: Take 1.5-3 mg by mouth See admin instructions. Take 3 mg in the evening once daily except on Friday evening take 1.5 mg) 90 tablet 0   No current facility-administered medications on file prior to visit.    Allergies  Allergen Reactions  . Donepezil Hcl Other (See Comments)    Hallucinations   . Memantine Hcl Other (See Comments)    Hallucinations     Past Medical History:  Diagnosis Date  . Abnormal ECG   . Cancer ([place])    skin  . Generalized anxiety disorder   . Headache   . Hypercholesterolemia   . Hypertension   . Major depressive disorder   . 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 Mitral insufficiency   . Mobitz type 1 second degree atrioventricular block   . Palpitations   . 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 S/P MVR (mitral valve replacement)  01/19/2012  . Stroke [facility])    " 2 mild" per spouse  . Wears glasses     Past Surgical History:  Procedure Laterality Date  . CATARACT EXTRACTION, BILATERAL    . CHOLECYSTECTOMY    . COLON SURGERY     with diversion and resection  . FALSE ANEURYSM REPAIR N/A 09/23/2019   Procedure: REPAIR FALSE ANEURYSM FOREHEAD;  Surgeon: Elam Dutch, MD;  Location: [facility];  Service: Vascular;  Laterality: N/A;  . MITRAL VALVE REPLACEMENT  2002   with a [facility].jude mechanical valve  . PROSTATE BIOPSY      Social History   Tobacco Use  Smoking Status Former Smoker  . Packs/day: 0.80  . Years: 18.00  . Pack years: 14.40  . Types: Cigarettes  . Quit date: 08/05/1979  . Years since quitting: 40.1  Smokeless Tobacco Never Used    Social History   Substance and  Sexual Activity  Alcohol Use Yes   Comment: occasional glass of wine    Family History  Problem Relation Age of Onset  . Heart failure Mother   . Hypertension Mother   . Lung cancer Sister     Review of Systems: As noted in history of present illness.  All other systems were reviewed and are negative.  Physical Exam: BP (!) 142/78   Pulse 68   Temp (!) 97.2 F (36.2 C)   Ht 5\' 7"  (1.702 m)   Wt 161 lb 12.8 oz (73.4 kg)   SpO2 98%   BMI 25.34 kg/m  GENERAL:  Well appearing WM in NAD HEENT:  PERRL, EOMI, sclera are clear. Oropharynx is clear. NECK:  No jugular venous distention, carotid upstroke brisk and symmetric, no bruits, no thyromegaly or adenopathy LUNGS:  Clear to auscultation bilaterally CHEST:  Unremarkable HEART:  RRR,  PMI not displaced or sustained, good mechanical MV click.  S2 within normal limits, no S3, no S4: no murmurs ABD:  Soft, nontender. BS +, no masses or bruits. No hepatomegaly, no splenomegaly EXT:  2 + pulses throughout, no edema, no cyanosis no clubbing SKIN:  Warm and dry.  No rashes NEURO:  Alert and oriented x 3. Cranial nerves II through XII intact. PSYCH:  Cognitively intact      LABORATORY DATA: Lab Results  Component Value Date   HGB 13.3 09/23/2019   HCT 39.0 09/23/2019   GLUCOSE 100 (H) 09/23/2019   NA 139 09/23/2019   K 3.7 09/23/2019   CL 104 09/23/2019   CREATININE 1.00 09/23/2019   BUN 21 09/23/2019   INR 1.4 (H) 09/23/2019    Labs reviewed from primary care dated 07/12/15: cholesterol 166, triglycerides 87, LDL 92, HDL 57. From October 30/17:  Glucose 106.  Chemistries, CBC normal.  Dated 05/27/17: Normal CBC, CMET, TSH.  Dated 05/19/18: cholesterol 141, triglycerides 77, HDL 63, LDL 63. PSA 10.9.  Dated 05/28/18: CMET and TSH normal. Dated 04/05/19: Hgb 12.8. CMET normal. TSH normal. Cholesterol 195, triglycerides 128, HDL 51, LDL 121.    Assessment / Plan: 1. Status post mechanical mitral valve replacement in 2002.  Good valve click on exam. Echo [id number] OK.  Patient is therapeutic on his Coumadin. We will continue with his current therapy. SBE prophylaxis.   2. Chronic anticoagulation. Adjusted per primary care.   3. History of Mobitz type 1 second degree AV block.  Resolved with reduction in beta blocker dose. On Inderal now and HR is good.  4. Memory loss. No change off Zocor.  Follow up in one year

## 2019-09-29 NOTE — Progress Notes (Signed)
Patient is an 79 year old male who returns for postoperative follow-up today.  He recently had excision of a pseudoaneurysm from a previous skin biopsy in his right temporal region.  He has had no further bleeding episodes.  He has had no reaccumulation of hematoma or mass.  Physical exam:  Vitals:   09/29/19 1541  BP: (!) 142/70  Pulse: 80  Resp: 20  Temp: 97.9 F (36.6 C)  SpO2: 97%  Weight: 161 lb (73 kg)  Height: 5\' 6"  (1.676 m)    Well-healed right temporal incision sutures removed today no pulsatile mass no drainage no erythema  Assessment: Doing well status post excision of pseudoaneurysm right temporal region.  Pathology showed no evidence of squamous cell carcinoma.  Plan: Patient will follow up on an as-needed basis.  He will resume his prior anticoagulation regime for his mitral valve.  Ruta Hinds, MD [facility] Office: [phone]

## 2019-10-04 ENCOUNTER — Other Ambulatory Visit: Payer: Self-pay

## 2019-10-04 ENCOUNTER — Encounter: Payer: Self-pay | Admitting: Cardiology

## 2019-10-04 ENCOUNTER — Ambulatory Visit (INDEPENDENT_AMBULATORY_CARE_PROVIDER_SITE_OTHER): Payer: Medicare Other | Admitting: Cardiology

## 2019-10-04 VITALS — BP 142/78 | HR 68 | Temp 97.2°F | Ht 67.0 in | Wt 161.8 lb

## 2019-10-04 DIAGNOSIS — Z952 Presence of prosthetic heart valve: Secondary | ICD-10-CM | POA: Diagnosis not present

## 2019-10-04 DIAGNOSIS — I441 Atrioventricular block, second degree: Secondary | ICD-10-CM | POA: Diagnosis not present

## 2019-10-04 DIAGNOSIS — I10 Essential (primary) hypertension: Secondary | ICD-10-CM

## 2019-12-31 ENCOUNTER — Emergency Department (HOSPITAL_COMMUNITY)
Admission: EM | Admit: 2019-12-31 | Discharge: 2019-12-31 | Disposition: A | Discharge status: Home / Self Care | Payer: Medicare Other | Attending: Emergency Medicine | Admitting: Emergency Medicine

## 2019-12-31 ENCOUNTER — Encounter (HOSPITAL_COMMUNITY): Payer: Self-pay | Admitting: Emergency Medicine

## 2019-12-31 ENCOUNTER — Other Ambulatory Visit: Payer: Self-pay

## 2019-12-31 ENCOUNTER — Emergency Department (HOSPITAL_COMMUNITY): Payer: Medicare Other

## 2019-12-31 DIAGNOSIS — Z79899 Other long term (current) drug therapy: Secondary | ICD-10-CM | POA: Insufficient documentation

## 2019-12-31 DIAGNOSIS — Z7901 Long term (current) use of anticoagulants: Secondary | ICD-10-CM | POA: Diagnosis not present

## 2019-12-31 DIAGNOSIS — Z85828 Personal history of other malignant neoplasm of skin: Secondary | ICD-10-CM | POA: Diagnosis not present

## 2019-12-31 DIAGNOSIS — R079 Chest pain, unspecified: Secondary | ICD-10-CM | POA: Diagnosis present

## 2019-12-31 DIAGNOSIS — R002 Palpitations: Secondary | ICD-10-CM | POA: Insufficient documentation

## 2019-12-31 DIAGNOSIS — Z87891 Personal history of nicotine dependence: Secondary | ICD-10-CM | POA: Insufficient documentation

## 2019-12-31 DIAGNOSIS — I10 Essential (primary) hypertension: Secondary | ICD-10-CM | POA: Insufficient documentation

## 2019-12-31 LAB — CBC
HCT: 42.9 % (ref 39.0–52.0)
Hemoglobin: 13.9 g/dL (ref 13.0–17.0)
MCH: 29.1 pg (ref 26.0–34.0)
MCHC: 32.4 g/dL (ref 30.0–36.0)
MCV: 89.7 fL (ref 80.0–100.0)
Platelets: 227 10*3/uL (ref 150–400)
RBC: 4.78 MIL/uL (ref 4.22–5.81)
RDW: 13.2 % (ref 11.5–15.5)
WBC: 4.3 10*3/uL (ref 4.0–10.5)
nRBC: 0 % (ref 0.0–0.2)

## 2019-12-31 LAB — BASIC METABOLIC PANEL
Anion gap: 9 (ref 5–15)
BUN: 14 mg/dL (ref 8–23)
CO2: 30 mmol/L (ref 22–32)
Calcium: 9.2 mg/dL (ref 8.9–10.3)
Chloride: 103 mmol/L (ref 98–111)
Creatinine, Ser: 0.98 mg/dL (ref 0.61–1.24)
GFR calc Af Amer: >60 mL/min (ref >60)
GFR calc non Af Amer: >60 mL/min (ref >60)
Glucose, Bld: 119 mg/dL — ABNORMAL HIGH (ref 70–99)
Potassium: 3.8 mmol/L (ref 3.5–5.1)
Sodium: 142 mmol/L (ref 135–145)

## 2019-12-31 LAB — PROTIME-INR
INR: 1.6 — ABNORMAL HIGH (ref 0.8–1.2)
Prothrombin Time: 18.7 seconds — ABNORMAL HIGH (ref 11.4–15.2)

## 2019-12-31 LAB — TROPONIN I (HIGH SENSITIVITY)
Troponin I (High Sensitivity): 5 ng/L (ref <18)
Troponin I (High Sensitivity): 6 ng/L (ref <18)

## 2019-12-31 MED ORDER — SODIUM CHLORIDE 0.9% FLUSH: 3.0000 mL | Freq: Once | INTRAVENOUS | Status: DC

## 2019-12-31 NOTE — ED Provider Notes (Signed)
[facility] EMERGENCY DEPARTMENT Provider Note   CSN: [id number] Arrival date & time: 12/31/19  1009     History Chief Complaint  Patient presents with  . Chest Pain    Nathan Mcdaniel is a 79 y.o. male.  HPI   79 year old male with chest pain.  History supplemented by his significant other at bedside.  He woke up this morning in his usual state of health.  Shortly later he began having pain in the left anterior chest.  He cannot remember to specifically doing when it first started.  Did radiate to left arm.  Denies any other associated symptoms such as dyspnea, nausea or diaphoresis.  Symptoms lasted approximately 1 hour prior to resolution.  No recurrence since.  He is status post mitral valve replacement on warfarin.  No known CAD that he is aware of. Past Medical History:  Diagnosis Date  . Abnormal ECG   . Cancer ([place])    skin  . Generalized anxiety disorder   . Headache   . Hypercholesterolemia   . Hypertension   . Major depressive disorder   . 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 Mitral insufficiency   . Mobitz type 1 second degree atrioventricular block   . Palpitations   . 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 Stroke [facility])    " 2 mild" per spouse  . Wears glasses     Patient Active Problem List   Diagnosis Date Noted  . 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 Major depressive disorder   . Generalized anxiety disorder   . Mobitz type 1 second degree atrioventricular block   . Abnormal ECG   . 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 Hypertension   . Palpitations   . Mitral insufficiency   . Hypercholesterolemia     Past Surgical History:  Procedure Laterality Date  . CATARACT EXTRACTION, BILATERAL    . CHOLECYSTECTOMY    . COLON SURGERY     with diversion and resection  . FALSE ANEURYSM REPAIR N/A 09/23/2019   Procedure: REPAIR FALSE ANEURYSM FOREHEAD;  Surgeon:  Elam Dutch, MD;  Location: [place];  Service: Vascular;  Laterality: N/A;  . MITRAL VALVE REPLACEMENT  2002   with a st.jude mechanical valve  . PROSTATE BIOPSY         Family History  Problem Relation Age of Onset  . Heart failure Mother   . Hypertension Mother   . Lung cancer Sister     Social History   Tobacco Use  . Smoking status: Former Smoker    Packs/day: 0.80    Years: 18.00    Pack years: 14.40    Types: Cigarettes    Quit date: 08/05/1979    Years since quitting: 40.4  . Smokeless tobacco: Never Used  Substance Use Topics  . Alcohol use: Yes    Comment: occasional glass of wine  . Drug use: No    Home Medications Prior to Admission medications   Medication Sig Start Date End Date Taking? Authorizing Provider  acetaminophen (TYLENOL) 325 MG tablet Take 650 mg by mouth every 6 (six) hours as needed for moderate pain or headache.    [provider]  buPROPion (WELLBUTRIN XL) 150 MG 24 hr tablet Take 150 mg by mouth in the morning and at bedtime.     [provider]  diclofenac Sodium (VOLTAREN) 1 % GEL Apply 1 application topically 4 (  four) times daily as needed (pain).    [provider]  enoxaparin (LOVENOX) 80 MG/0.8ML injection Inject 0.8 mLs (80 mg total) into the skin every 12 (twelve) hours for 7 doses. 09/23/19 09/27/19  Dagoberto Ligas, PA-C  escitalopram (LEXAPRO) 20 MG tablet Take 20 mg by mouth daily. 09/27/19   [provider]  fluticasone (FLONASE) 50 MCG/ACT nasal spray Place 1-2 sprays into both nostrils daily as needed for allergies.  01/26/15   [provider]  HYDROcodone-acetaminophen (NORCO) 5-325 MG tablet Take 1 tablet by mouth every 6 (six) hours as needed for moderate pain. Patient not taking: Reported on 09/29/2019 09/23/19   Dagoberto Ligas, PA-C  LORazepam (ATIVAN) 0.5 MG tablet Take 0.5 mg by mouth every 6 (six) hours as needed for anxiety.     [provider]  Multiple Vitamin  (MULTIVITAMIN WITH MINERALS) TABS tablet Take 1 tablet by mouth daily.    [provider]  propranolol (INDERAL) 10 MG tablet Take 10 mg by mouth 3 (three) times daily.    [provider]  sertraline (ZOLOFT) 50 MG tablet Take 50 mg by mouth every evening.  08/28/14   [provider]  Tamsulosin HCl (FLOMAX) 0.4 MG CAPS Take 0.4 mg by mouth at bedtime.     [provider]  warfarin (COUMADIN) 3 MG tablet TAKE 1/2 - 1 TABLET DAILY OR AS DIRECTED BY COUMADIN CLINIC Patient taking differently: Take 1.5-3 mg by mouth See admin instructions. Take 3 mg in the evening once daily except on Friday evening take 1.5 mg 09/21/17   Martinique, Peter M, MD    Allergies    Donepezil hcl and Memantine hcl  Review of Systems   Review of Systems All systems reviewed and negative, other than as noted in HPI.  Physical Exam Updated Vital Signs BP (!) 143/79   Pulse (!) 59   Temp 98.8 F (37.1 C) (Oral)   Resp 15   SpO2 99%   Physical Exam Vitals and nursing note reviewed.  Constitutional:      General: He is not in acute distress.    Appearance: He is well-developed.  HENT:     Head: Normocephalic and atraumatic.  Eyes:     General:        Right eye: No discharge.        Left eye: No discharge.     Conjunctiva/sclera: Conjunctivae normal.  Cardiovascular:     Rate and Rhythm: Normal rate and regular rhythm.     Heart sounds: Normal heart sounds. No murmur. No friction rub. No gallop.   Pulmonary:     Effort: Pulmonary effort is normal. No respiratory distress.     Breath sounds: Normal breath sounds.  Abdominal:     General: There is no distension.     Palpations: Abdomen is soft.     Tenderness: There is no abdominal tenderness.  Musculoskeletal:        General: No tenderness.     Cervical back: Neck supple.     Comments: Lower extremities symmetric as compared to each other. No calf tenderness. Negative Homan's. No palpable cords.   Skin:    General:  Skin is warm and dry.  Neurological:     Mental Status: He is alert.  Psychiatric:        Behavior: Behavior normal.        Thought Content: Thought content normal.     ED Results / Procedures / Treatments   Labs (all labs  ordered are listed, but only abnormal results are displayed) Labs Reviewed  BASIC METABOLIC PANEL - Abnormal; Notable for the following components:      Result Value   Glucose, Bld 119 (*)    All other components within normal limits  PROTIME-INR - Abnormal; Notable for the following components:   Prothrombin Time 18.7 (*)    INR 1.6 (*)    All other components within normal limits  CBC  TROPONIN I (HIGH SENSITIVITY)  TROPONIN I (HIGH SENSITIVITY)    EKG EKG Interpretation  Date/Time:  Saturday Dec 31 2019 10:10:06 EDT Ventricular Rate:  64 PR Interval:  210 QRS Duration: 106 QT Interval:  454 QTC Calculation: 468 R Axis:   8 Text Interpretation: Sinus rhythm with 1st degree A-V block Nonspecific ST abnormality Abnormal ECG Confirmed by Virgel Manifold [phone]) on 12/31/2019 11:10:42 AM   Radiology DG Chest 2 View  Result Date: 12/31/2019 CLINICAL DATA:  Left-sided chest pain for 1 hour EXAM: CHEST - 2 VIEW COMPARISON:  10/08/2006 FINDINGS: Cardiac shadow is within normal limits. Postsurgical changes are again seen and stable. Lungs are clear. No infiltrate or effusion is seen. Degenerative changes of the thoracic spine are noted. IMPRESSION: No acute abnormality noted. Electronically Signed   By: Inez Catalina M.D.   On: 12/31/2019 10:51    Procedures Procedures (including critical care time)  Medications Ordered in ED Medications  sodium chloride flush (NS) 0.9 % injection 3 mL (has no administration in time range)    ED Course  I have reviewed the triage vital signs and the nursing notes.  Pertinent labs & imaging results that were available during my care of the patient were reviewed by me and considered in my medical decision making (see  chart for details).    MDM Rules/Calculators/A&P                      79 year old male with chest pain.  Now resolved.  EKG with no overt ischemic changes.  Initial troponin is normal.  Given symptom onset shortly before arrival, will repeat.  Doubt ACS though.  Also doubt PE, dissection of the emergent process.  Final Clinical Impression(s) / ED Diagnoses Final diagnoses:  Chest pain, unspecified type    Rx / DC Orders ED Discharge Orders    None       Virgel Manifold, MD 01/05/20 [id number]

## 2019-12-31 NOTE — ED Triage Notes (Signed)
Reports sharp pain to L chest 40 min ago.  States now he only has discomfort.  Denies SOB, nausea, and vomiting.

## 2020-01-04 ENCOUNTER — Emergency Department (HOSPITAL_COMMUNITY): Payer: Medicare Other

## 2020-01-04 ENCOUNTER — Emergency Department (HOSPITAL_COMMUNITY)
Admission: EM | Admit: 2020-01-04 | Discharge: 2020-01-04 | Disposition: A | Discharge status: Home / Self Care | Payer: Medicare Other | Attending: Emergency Medicine | Admitting: Emergency Medicine

## 2020-01-04 ENCOUNTER — Other Ambulatory Visit: Payer: Self-pay

## 2020-01-04 ENCOUNTER — Encounter (HOSPITAL_COMMUNITY): Payer: Self-pay | Admitting: Emergency Medicine

## 2020-01-04 ENCOUNTER — Telehealth: Payer: Self-pay | Admitting: Cardiology

## 2020-01-04 DIAGNOSIS — Z8673 Personal history of transient ischemic attack (TIA), and cerebral infarction without residual deficits: Secondary | ICD-10-CM | POA: Insufficient documentation

## 2020-01-04 DIAGNOSIS — I10 Essential (primary) hypertension: Secondary | ICD-10-CM | POA: Diagnosis not present

## 2020-01-04 DIAGNOSIS — R0789 Other chest pain: Secondary | ICD-10-CM | POA: Diagnosis present

## 2020-01-04 DIAGNOSIS — R079 Chest pain, unspecified: Secondary | ICD-10-CM

## 2020-01-04 LAB — BASIC METABOLIC PANEL
Anion gap: 7 (ref 5–15)
BUN: 15 mg/dL (ref 8–23)
CO2: 31 mmol/L (ref 22–32)
Calcium: 9.2 mg/dL (ref 8.9–10.3)
Chloride: 100 mmol/L (ref 98–111)
Creatinine, Ser: 1.04 mg/dL (ref 0.61–1.24)
GFR calc Af Amer: >60 mL/min (ref >60)
GFR calc non Af Amer: >60 mL/min (ref >60)
Glucose, Bld: 120 mg/dL — ABNORMAL HIGH (ref 70–99)
Potassium: 4.5 mmol/L (ref 3.5–5.1)
Sodium: 138 mmol/L (ref 135–145)

## 2020-01-04 LAB — CBC
HCT: 44.7 % (ref 39.0–52.0)
Hemoglobin: 14.1 g/dL (ref 13.0–17.0)
MCH: 28.6 pg (ref 26.0–34.0)
MCHC: 31.5 g/dL (ref 30.0–36.0)
MCV: 90.7 fL (ref 80.0–100.0)
Platelets: 238 10*3/uL (ref 150–400)
RBC: 4.93 MIL/uL (ref 4.22–5.81)
RDW: 13.1 % (ref 11.5–15.5)
WBC: 5 10*3/uL (ref 4.0–10.5)
nRBC: 0 % (ref 0.0–0.2)

## 2020-01-04 LAB — TROPONIN I (HIGH SENSITIVITY)
Troponin I (High Sensitivity): 6 ng/L (ref <18)
Troponin I (High Sensitivity): 6 ng/L (ref <18)

## 2020-01-04 LAB — PROTIME-INR
INR: 1.9 — ABNORMAL HIGH (ref 0.8–1.2)
Prothrombin Time: 20.7 seconds — ABNORMAL HIGH (ref 11.4–15.2)

## 2020-01-04 MED ORDER — OMEPRAZOLE 20 MG PO CPDR
20.0000 mg | DELAYED_RELEASE_CAPSULE | Freq: Every day | ORAL | 1 refills | Status: DC
Start: 2020-01-04 — End: 2020-11-13

## 2020-01-04 MED ORDER — SODIUM CHLORIDE 0.9% FLUSH: 3.0000 mL | Freq: Once | INTRAVENOUS | Status: DC

## 2020-01-04 MED ORDER — PANTOPRAZOLE SODIUM 40 MG PO TBEC
40.0000 mg | DELAYED_RELEASE_TABLET | Freq: Every day | ORAL | Status: DC
  Administered 2020-01-04: 40 mg via ORAL
  Filled 2020-01-04: qty 1

## 2020-01-04 NOTE — ED Provider Notes (Addendum)
[facility] Emergency Department Provider Note MRN:  [id number]  Arrival date & time: 01/04/20     Chief Complaint   Chest Pain   History of Present Illness   Nathan Mcdaniel is a 79 y.o. year-old male with a history of hypertension, stroke presenting to the ED with chief complaint of chest pain.  Woke with chest pain this morning at 10 AM.  Felt well yesterday.  Pain is described as a discomfort that is "just there" unable to describe as either sharp or dull or a pressure.  Lasted 2 hours and then resolved.  Currently without symptoms.  Denies dizziness or diaphoresis, no nausea or vomiting, no trouble breathing.  No abdominal pain, no numbness or weakness to the arms or legs.  Tried to see his cardiologist this morning but was sent here.  Review of Systems  A complete 10 system review of systems was obtained and all systems are negative except as noted in the HPI and PMH.   Patient's Health History    Past Medical History:  Diagnosis Date  . Abnormal ECG   . Cancer ([place])    skin  . Generalized anxiety disorder   . Headache   . Hypercholesterolemia   . Hypertension   . Major depressive disorder   . 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 Mitral insufficiency   . Mobitz type 1 second degree atrioventricular block   . Palpitations   . 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 Stroke [facility])    " 2 mild" per spouse  . Wears glasses     Past Surgical History:  Procedure Laterality Date  . CATARACT EXTRACTION, BILATERAL    . CHOLECYSTECTOMY    . COLON SURGERY     with diversion and resection  . FALSE ANEURYSM REPAIR N/A 09/23/2019   Procedure: REPAIR FALSE ANEURYSM FOREHEAD;  Surgeon: Elam Dutch, MD;  Location: [facility];  Service: Vascular;  Laterality: N/A;  . MITRAL VALVE REPLACEMENT  2002   with a st.jude mechanical valve  . PROSTATE BIOPSY      Family History  Problem Relation Age of Onset    . Heart failure Mother   . Hypertension Mother   . Lung cancer Sister     Social History   Socioeconomic History  . Marital status: Married    Spouse name: Not on file  . Number of children: 2  . Years of education: 66  . Highest education level: Master's degree (e.g., MA, MS, MEng, MEd, MSW, MBA)  Occupational History  . 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 Smoking status: Former Smoker    Packs/day: 0.80    Years: 18.00    Pack years: 14.40    Types: Cigarettes    Quit date: 08/05/1979    Years since quitting: 40.4  . Smokeless tobacco: Never Used  Substance and Sexual Activity  . Alcohol use: Yes    Comment: occasional glass of wine  . Drug use: No  . Sexual activity: Not on file  Other Topics Concern  . Not on file  Social History Narrative  . Not on file   Social Determinants of Health   Financial Resource Strain:   . Difficulty of Paying Living Expenses:   Food Insecurity:   . Worried About Charity fundraiser in the Last Year:   . [employer]  of Food in the Last Year:   Transportation Needs:   . Film/video editor (Medical):   Marland Kitchen Lack of Transportation (Non-Medical):   Physical Activity:   . Days of Exercise per Week:   . Minutes of Exercise per Session:   Stress:   . Feeling of Stress :   Social Connections:   . Frequency of Communication with Friends and Family:   . Frequency of Social Gatherings with Friends and Family:   . Attends Religious Services:   . Active Member of Clubs or Organizations:   . Attends Archivist Meetings:   Marland Kitchen Marital Status:   Intimate Partner Violence:   . Fear of Current or Ex-Partner:   . Emotionally Abused:   Marland Kitchen Physically Abused:   . Sexually Abused:      Physical Exam   Vitals:   01/04/20 1400 01/04/20 1430  BP:    Pulse: (!) 58 (!) 58  Resp: 14 14  Temp:    SpO2: 97% 97%    CONSTITUTIONAL: Well-appearing, NAD NEURO:  Alert and oriented x 3,  no focal deficits EYES:  eyes equal and reactive ENT/NECK:  no LAD, no JVD CARDIO: Regular rate, well-perfused, normal S1 and S2 PULM:  CTAB no wheezing or rhonchi GI/GU:  normal bowel sounds, non-distended, non-tender MSK/SPINE:  No gross deformities, no edema SKIN:  no rash, atraumatic PSYCH:  Appropriate speech and behavior  *Additional and/or pertinent findings included in MDM below  Diagnostic and Interventional Summary    EKG Interpretation  Date/Time:  Wednesday January 04 2020 12:20:58 EDT Ventricular Rate:  64 PR Interval:  224 QRS Duration: 114 QT Interval:  432 QTC Calculation: 445 R Axis:   25 Text Interpretation: Sinus rhythm with 1st degree A-V block Minimal voltage criteria for LVH, may be normal variant ( Cornell product ) Borderline ECG Confirmed by Gerlene Fee [phone]) on 01/04/2020 1:46:43 PM      Labs Reviewed  BASIC METABOLIC PANEL - Abnormal; Notable for the following components:      Result Value   Glucose, Bld 120 (*)    All other components within normal limits  PROTIME-INR - Abnormal; Notable for the following components:   Prothrombin Time 20.7 (*)    INR 1.9 (*)    All other components within normal limits  CBC  TROPONIN I (HIGH SENSITIVITY)  TROPONIN I (HIGH SENSITIVITY)    DG Chest 2 View  Final Result      Medications  sodium chloride flush (NS) 0.9 % injection 3 mL (has no administration in time range)  pantoprazole (PROTONIX) EC tablet 40 mg (has no administration in time range)     Procedures  /  Critical Care Procedures  ED Course and Medical Decision Making  I have reviewed the triage vital signs, the nursing notes, and pertinent available records from the EMR.  Listed above are laboratory and imaging tests that I personally ordered, reviewed, and interpreted and then considered in my medical decision making (see below for details).      This is the patient's second ED visit for chest pain in the past few days.  They have been  unable to follow-up with her cardiologist, and they were told that there is no room in the cardiology clinic until late June or early July.  Patient's heart score is 4-5.  Pain is now resolved.  Pain does seem to occur when laying flat, suggesting a possible GI etiology.  Will obtain second troponin and discuss management options  with shared decision making to determine final disposition.  Discussed options with patient, who prefers to go home.  His pain has been resolved for a few hours, his first troponin is negative, his EKG is unchanged.  We will trial him on PPI, will message Dr. Martinique to hopefully expedite his follow-up.  Second troponin negative, appropriate for discharge.  Barth Kirks. Sedonia Small, [place] [email or web address]  Final Clinical Impressions(s) / ED Diagnoses     ICD-10-CM   1. Chest pain, unspecified type  R07.9     ED Discharge Orders         Ordered    omeprazole (PRILOSEC) 20 MG capsule  Daily     01/04/20 1507           Discharge Instructions Discussed with and Provided to Patient:     Discharge Instructions     You were evaluated in the Emergency Department and after careful evaluation, we did not find any emergent condition requiring admission or further testing in the hospital.  Your exam/testing today is overall reassuring.  Your symptoms may be due to acid reflux.  Please take the omeprazole medication as directed.  We still recommend close cardiology follow-up for possible stress testing.  We messaged Dr. Martinique to hopefully expedite your follow-up.  Please continue to call the office.  Please return to the Emergency Department if you experience any worsening of your condition.  We encourage you to follow up with a primary care provider.  Thank you for allowing [place] to be a part of your care.       Maudie Flakes, MD 01/04/20 1510    Maudie Flakes, MD 01/04/20 1539

## 2020-01-04 NOTE — Telephone Encounter (Signed)
   Pt c/o of Chest Pain: STAT if CP now or developed within 24 hours  1. Are you having CP right now? Yes, above his heart down to his left arm  2. Are you experiencing any other symptoms (ex. SOB, nausea, vomiting, sweating)? No  3. How long have you been experiencing CP? Close to an hour  4. Is your CP continuous or coming and going? continuous   5. Have you taken Nitroglycerin? No  Pt has Artificial mitral valve

## 2020-01-04 NOTE — ED Triage Notes (Signed)
Pt here for eval of left sided chest pain radiating to left arm that started an hour ago. No shortness of breath or dizziness.

## 2020-01-04 NOTE — Telephone Encounter (Signed)
Spoke with patients wife Patient having chest pains radiating down left arm He was in ED 12/31/2019 Has had chest pains twice since then, first episode last 1-2 hours Pain this am over an hour, pain level was 6 now 3 Denies shortness of breath or nausea  Discussed with Dr Harrell Gave and recommends ED with f/u later this week if ED visit checks out ok Advised wife, verbalizes understanding

## 2020-01-04 NOTE — Discharge Instructions (Addendum)
You were evaluated in the Emergency Department and after careful evaluation, we did not find any emergent condition requiring admission or further testing in the hospital.  Your exam/testing today is overall reassuring.  Your symptoms may be due to acid reflux.  Please take the omeprazole medication as directed.  We still recommend close cardiology follow-up for possible stress testing.  We messaged Dr. Martinique to hopefully expedite your follow-up.  Please continue to call the office.  Please return to the Emergency Department if you experience any worsening of your condition.  We encourage you to follow up with a primary care provider.  Thank you for allowing [place] to be a part of your care.

## 2020-01-05 NOTE — Progress Notes (Deleted)
Cardiology Clinic Note   Patient Name: Nathan Mcdaniel Date of Encounter: 01/06/2020  Primary Care Provider:  Chesley Noon, MD Primary Cardiologist:  Peter Martinique, MD  Patient Profile    Nathan Mcdaniel 79 year old male presents today for a follow-up of his chest pain.  Past Medical History    Past Medical History:  Diagnosis Date  . Abnormal ECG   . Cancer ([place])    skin  . Generalized anxiety disorder   . Headache   . Hypercholesterolemia   . Hypertension   . Major depressive disorder   . 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 Mitral insufficiency   . Mobitz type 1 second degree atrioventricular block   . Palpitations   . 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 Stroke [facility])    " 2 mild" per spouse  . Wears glasses    Past Surgical History:  Procedure Laterality Date  . CATARACT EXTRACTION, BILATERAL    . CHOLECYSTECTOMY    . COLON SURGERY     with diversion and resection  . FALSE ANEURYSM REPAIR N/A 09/23/2019   Procedure: REPAIR FALSE ANEURYSM FOREHEAD;  Surgeon: Elam Dutch, MD;  Location: [place];  Service: Vascular;  Laterality: N/A;  . MITRAL VALVE REPLACEMENT  2002   with a st.jude mechanical valve  . PROSTATE BIOPSY      Allergies  Allergies  Allergen Reactions  . Donepezil Hcl Other (See Comments)    Hallucinations   . Memantine Hcl Other (See Comments)    Hallucinations     History of Present Illness    Nathan Mcdaniel is a PMH of hypertension, mitral insufficiency (status post MVR), Mobitz type I, palpitations, hypercholesterolemia, mild neurocognitive disorder, major depressive disorder and generalized anxiety disorder.  He was last seen by Dr. Martinique on 10/04/2019 for follow-up of his mitral valve disease.  He underwent mechanical MVR in 2002.  He was noted to have a history of PACs.  January 2015 he was noted to have marked ST/T wave changes on EKG.  An echocardiogram and nuclear stress test  were done which showed good results.  He was also noted to have second-degree AV block and metoprolol dosing was reduced.  He has since been switched to Inderal for management of a tremor.  In January 2021 he developed pseudoaneurysm of the temporal artery follow-up by punch biopsy of S SCA of the skin.  This was performed by Dr. Oneida Alar.  At that time he reported he was doing well.  He was having trouble with some memory loss.  However, he stated this did not bother him as much due to the fact that he was mostly at home at that time.  He denied bleeding, edema, palpitations, dizziness, chest pain, and dyspnea.  He presented to the emergency department on 01/04/2020 with chest discomfort.  He reported that he woke up that morning at 10 AM with chest pain.  He was unable to describe the pain as sharp or dull or pressure.  He stated it lasted for around 2 hours and then resolved.  He did not have symptoms of dizziness, diaphoresis, nausea, shortness of breath, abdominal pain, numbness, or weakness.  His EKG showed sinus rhythm first-degree AV block 64 bpm.  His high-sensitivity troponins were 6 and 6.  His chest x-ray showed his healed median sternotomy with mitral valve prosthesis, and no acute abnormality of his lungs.  He presents to the clinic  today for follow-up evaluation and states***  *** denies chest pain, shortness of breath, lower extremity edema, fatigue, palpitations, melena, hematuria, hemoptysis, diaphoresis, weakness, presyncope, syncope, orthopnea, and PND.   Home Medications    Prior to Admission medications   Medication Sig Start Date End Date Taking? Authorizing Provider  acetaminophen (TYLENOL) 325 MG tablet Take 650 mg by mouth every 6 (six) hours as needed for moderate pain or headache.    [provider]  buPROPion HCl ER, XL, 450 MG TB24 Take 450 mg by mouth every morning. 12/28/19   [provider]  diclofenac Sodium (VOLTAREN) 1 % GEL Apply 1 application  topically 4 (four) times daily as needed (pain).    [provider]  enoxaparin (LOVENOX) 80 MG/0.8ML injection Inject 0.8 mLs (80 mg total) into the skin every 12 (twelve) hours for 7 doses. 09/23/19 09/27/19  Dagoberto Ligas, PA-C  fluticasone (FLONASE) 50 MCG/ACT nasal spray Place 1-2 sprays into both nostrils daily as needed for allergies.  01/26/15   [provider]  HYDROcodone-acetaminophen (NORCO) 5-325 MG tablet Take 1 tablet by mouth every 6 (six) hours as needed for moderate pain. Patient not taking: Reported on 09/29/2019 09/23/19   Dagoberto Ligas, PA-C  LORazepam (ATIVAN) 0.5 MG tablet Take 0.5 mg by mouth every 6 (six) hours as needed for anxiety.     [provider]  omeprazole (PRILOSEC) 20 MG capsule Take 1 capsule (20 mg total) by mouth daily. 01/04/20   Maudie Flakes, MD  propranolol (INDERAL) 10 MG tablet Take 10 mg by mouth 3 (three) times daily.    [provider]  sertraline (ZOLOFT) 50 MG tablet Take 50 mg by mouth every evening.  08/28/14   [provider]  Tamsulosin HCl (FLOMAX) 0.4 MG CAPS Take 0.4 mg by mouth at bedtime.     [provider]  warfarin (COUMADIN) 3 MG tablet TAKE 1/2 - 1 TABLET DAILY OR AS DIRECTED BY COUMADIN CLINIC Patient taking differently: Take 1.5-3 mg by mouth See admin instructions. Take 3 mg in the evening once daily except on Friday evening take 1.5 mg 09/21/17   Martinique, Peter M, MD    Family History    Family History  Problem Relation Age of Onset  . Heart failure Mother   . Hypertension Mother   . Lung cancer Sister    He indicated that his mother is deceased. He indicated that his father is deceased. He indicated that two of his three sisters are alive. He indicated that his maternal grandmother is deceased. He indicated that his maternal grandfather is deceased. He indicated that his paternal grandmother is deceased. He indicated that his paternal grandfather is deceased.  Social  History    Social History   Socioeconomic History  . Marital status: Married    Spouse name: Not on file  . Number of children: 2  . Years of education: 9  . Highest education level: Master's degree (e.g., MA, MS, MEng, MEd, MSW, MBA)  Occupational History  . 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 Smoking status: Former Smoker    Packs/day: 0.80    Years: 18.00    Pack years: 14.40    Types: Cigarettes    Quit date: 08/05/1979    Years since quitting: 40.4  . Smokeless tobacco: Never Used  Substance and Sexual Activity  . Alcohol use: Yes    Comment: occasional glass of wine  .  Drug use: No  . Sexual activity: Not on file  Other Topics Concern  . Not on file  Social History Narrative  . Not on file   Social Determinants of Health   Financial Resource Strain:   . Difficulty of Paying Living Expenses:   Food Insecurity:   . Worried About Charity fundraiser in the Last Year:   . Arboriculturist in the Last Year:   Transportation Needs:   . Film/video editor (Medical):   Marland Kitchen Lack of Transportation (Non-Medical):   Physical Activity:   . Days of Exercise per Week:   . Minutes of Exercise per Session:   Stress:   . Feeling of Stress :   Social Connections:   . Frequency of Communication with Friends and Family:   . Frequency of Social Gatherings with Friends and Family:   . Attends Religious Services:   . Active Member of Clubs or Organizations:   . Attends Archivist Meetings:   Marland Kitchen Marital Status:   Intimate Partner Violence:   . Fear of Current or Ex-Partner:   . Emotionally Abused:   Marland Kitchen Physically Abused:   . Sexually Abused:      Review of Systems    General:  No chills, fever, night sweats or weight changes.  Cardiovascular:  No chest pain, dyspnea on exertion, edema, orthopnea, palpitations, paroxysmal nocturnal dyspnea. Dermatological: No rash, lesions/masses Respiratory: No cough,  dyspnea Urologic: No hematuria, dysuria Abdominal:   No nausea, vomiting, diarrhea, bright red blood per rectum, melena, or hematemesis Neurologic:  No visual changes, wkns, changes in mental status. All other systems reviewed and are otherwise negative except as noted above.  Physical Exam    VS:  There were no vitals taken for this visit. , BMI There is no height or weight on file to calculate BMI. GEN: Well nourished, well developed, in no acute distress. HEENT: normal. Neck: Supple, no JVD, carotid bruits, or masses. Cardiac: RRR, no murmurs, rubs, or gallops. No clubbing, cyanosis, edema.  Radials/DP/PT 2+ and equal bilaterally.  Respiratory:  Respirations regular and unlabored, clear to auscultation bilaterally. GI: Soft, nontender, nondistended, BS + x 4. MS: no deformity or atrophy. Skin: warm and dry, no rash. Neuro:  Strength and sensation are intact. Psych: Normal affect.  Accessory Clinical Findings    ECG personally reviewed by me today- *** - No acute changes  EKG 01/04/2020 Sinus rhythm with first-degree AV block 64 bpm  Echocardiogram 08/23/2013 Study Conclusions   - Left ventricle: The cavity size was mildly dilated. Wall  thickness was increased in a pattern of moderate LVH.  Systolic function was normal. The estimated ejection  fraction was in the range of 50% to 55%.  - Aortic valve: Trivial regurgitation.  - Mitral valve: Normal appearing mechanical MVR with no peri  valvular leak  - Left atrium: The atrium was moderately to severely  dilated.  - Right atrium: The atrium was mildly dilated.  - Atrial septum: No defect or patent foramen ovale was  identified.   Stress test 08/31/2013 Overall Impression:  Normal stress nuclear study.  LV Ejection Fraction: 49%.  LV Wall Motion:  Paradoxican septal motion, otherwise no regional wall motion abnormalities, low normal LVEF.  Assessment & Plan   1.  Chest pain-no chest pain today.  Presented to  the emergency department on 01/04/20.  EKG showed first-degree AV block 64 bpm no acute changes.  High-sensitivity troponins were 6 and 6.He reported that he  woke up that morning at 10 AM with chest pain.  He was unable to describe the pain as sharp or dull or pressure.  He stated it lasted for around 2 hours and then resolved.  He did not have symptoms of dizziness, diaphoresis, nausea, shortness of breath, abdominal pain, numbness, or weakness.  Chest x-ray negative for acute abnormalities. Heart healthy low-sodium diet Increase physical activity as tolerated Order echocardiogram  Status post mitral valve replacement-MVR 2002.  Previous echo 2015 showed EF of 50-55% and normal functioning mitral valve. Continue Coumadin Continue SBE prophylaxis  History of Mobitz type I second-degree AV block-resolved with reduction in beta-blocker.  Taking Inderal for tremor.  EKG unchanged on 01/04/2020. Continue to monitor  Disposition: Follow-up with me in 1 month  Vedh Ptacek M. Anajah Sterbenz NP-C    01/06/2020, [place] [address] Office [phone] Fax [phone]

## 2020-01-06 ENCOUNTER — Ambulatory Visit: Payer: Medicare Other | Admitting: General Practice

## 2020-01-20 NOTE — Progress Notes (Signed)
Cardiology Clinic Note   Patient Name: Nathan Mcdaniel Date of Encounter: 01/24/2020  Primary Care Provider:  Chesley Noon, MD Primary Cardiologist:  Nathan Martinique, MD  Patient Profile    Nathan Mcdaniel 79 year old male presents today for a follow-up of his chest pain.  Past Medical History    Past Medical History:  Diagnosis Date  . Abnormal ECG   . Cancer ([place])    skin  . Generalized anxiety disorder   . Headache   . Hypercholesterolemia   . Hypertension   . Major depressive disorder   . 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 Mitral insufficiency   . Mobitz type 1 second degree atrioventricular block   . Palpitations   . 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 Stroke [facility])    " 2 mild" per spouse  . Wears glasses    Past Surgical History:  Procedure Laterality Date  . CATARACT EXTRACTION, BILATERAL    . CHOLECYSTECTOMY    . COLON SURGERY     with diversion and resection  . FALSE ANEURYSM REPAIR N/A 09/23/2019   Procedure: REPAIR FALSE ANEURYSM FOREHEAD;  Surgeon: Nathan Dutch, MD;  Location: [place];  Service: Vascular;  Laterality: N/A;  . MITRAL VALVE REPLACEMENT  2002   with a st.jude mechanical valve  . PROSTATE BIOPSY      Allergies  Allergies  Allergen Reactions  . Donepezil Hcl Other (See Comments)    Hallucinations   . Memantine Hcl Other (See Comments)    Hallucinations     History of Present Illness    Nathan Mcdaniel has a PMH of hypertension, mitral insufficiency (status post MVR), Mobitz type I, palpitations, hypercholesterolemia, mild neurocognitive disorder, major depressive disorder and generalized anxiety disorder.  He was last seen by Dr. Martinique on 10/04/2019 for follow-up of his mitral valve disease.  He underwent mechanical MVR in 2002.  He was noted to have a history of PACs.  January 2015 he was noted to have marked ST/T wave changes on EKG.  An echocardiogram and nuclear stress  test were done which showed good results.  He was also noted to have second-degree AV block and metoprolol dosing was reduced.  He has since been switched to Inderal for management of a tremor.  In January 2021 he developed pseudoaneurysm of the temporal artery follow-up by punch biopsy of S SCA of the skin.  This was performed by Dr. Oneida Mcdaniel.  At that time he reported he was doing well.  He was having trouble with some memory loss.  However, he stated this did not bother him as much due to the fact that he was mostly at home at that time.  He denied bleeding, edema, palpitations, dizziness, chest pain, and dyspnea.  He presented to the emergency department on 01/04/2020 with chest discomfort.  He reported that he woke up that morning at 10 AM with chest pain.  He was unable to describe the pain as sharp or dull or pressure.  He stated it lasted for around 2 hours and then resolved.  He did not have symptoms of dizziness, diaphoresis, nausea, shortness of breath, abdominal pain, numbness, or weakness.  His EKG showed sinus rhythm first-degree AV block 64 bpm.  His high-sensitivity troponins were 6 and 6.  His chest x-ray showed his healed median sternotomy with mitral valve prosthesis, and no acute abnormality of his lungs.  He presents to the clinic  today for follow-up evaluation and states he feels well.  He presents with his wife who states he has some memory deficits.  She indicates that he has had 1 further episode of chest pain that was not as long.  It is described as a dull ache with radiation down his left arm.  This episode was not as long it lasted for about 2 hours.  It dissipated with rest.  It came on at rest and he has had no exertional chest discomfort.  I will order an echocardiogram for further evaluation and have him follow-up in 1 month.  Today he denies chest pain, shortness of breath, lower extremity edema, fatigue, palpitations, melena,  hemoptysis, diaphoresis, weakness, presyncope,  syncope, orthopnea, and PND.  Home Medications    Prior to Admission medications   Medication Sig Start Date End Date Taking? Authorizing Provider  acetaminophen (TYLENOL) 325 MG tablet Take 650 mg by mouth every 6 (six) hours as needed for moderate pain or headache.    [provider]  buPROPion HCl ER, XL, 450 MG TB24 Take 450 mg by mouth every morning. 12/28/19   [provider]  diclofenac Sodium (VOLTAREN) 1 % GEL Apply 1 application topically 4 (four) times daily as needed (pain).    [provider]  enoxaparin (LOVENOX) 80 MG/0.8ML injection Inject 0.8 mLs (80 mg total) into the skin every 12 (twelve) hours for 7 doses. 09/23/19 09/27/19  Nathan Ligas, PA-C  fluticasone (FLONASE) 50 MCG/ACT nasal spray Place 1-2 sprays into both nostrils daily as needed for allergies.  01/26/15   [provider]  HYDROcodone-acetaminophen (NORCO) 5-325 MG tablet Take 1 tablet by mouth every 6 (six) hours as needed for moderate pain. Patient not taking: Reported on 09/29/2019 09/23/19   Nathan Ligas, PA-C  LORazepam (ATIVAN) 0.5 MG tablet Take 0.5 mg by mouth every 6 (six) hours as needed for anxiety.     [provider]  omeprazole (PRILOSEC) 20 MG capsule Take 1 capsule (20 mg total) by mouth daily. 01/04/20   Nathan Flakes, MD  propranolol (INDERAL) 10 MG tablet Take 10 mg by mouth 3 (three) times daily.    [provider]  sertraline (ZOLOFT) 50 MG tablet Take 50 mg by mouth every evening.  08/28/14   [provider]  Tamsulosin HCl (FLOMAX) 0.4 MG CAPS Take 0.4 mg by mouth at bedtime.     [provider]  warfarin (COUMADIN) 3 MG tablet TAKE 1/2 - 1 TABLET DAILY OR AS DIRECTED BY COUMADIN CLINIC Patient taking differently: Take 1.5-3 mg by mouth See admin instructions. Take 3 mg in the evening once daily except on Friday evening take 1.5 mg 09/21/17   Mcdaniel, Nathan M, MD    Family History    Family History  Problem Relation  Age of Onset  . Heart failure Mother   . Hypertension Mother   . Lung cancer Sister    He indicated that his mother is deceased. He indicated that his father is deceased. He indicated that two of his three sisters are alive. He indicated that his maternal grandmother is deceased. He indicated that his maternal grandfather is deceased. He indicated that his paternal grandmother is deceased. He indicated that his paternal grandfather is deceased.  Social History    Social History   Socioeconomic History  . Marital status: Married    Spouse name: Not on file  . Number of children: 2  . Years of education: 38  . Highest education  level: Master's degree (e.g., MA, MS, MEng, MEd, MSW, MBA)  Occupational History  . 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 Smoking status: Former Smoker    Packs/day: 0.80    Years: 18.00    Pack years: 14.40    Types: Cigarettes    Quit date: 08/05/1979    Years since quitting: 40.4  . Smokeless tobacco: Never Used  Vaping Use  . Vaping Use: Never used  Substance and Sexual Activity  . Alcohol use: Yes    Comment: occasional glass of wine  . Drug use: No  . Sexual activity: Not on file  Other Topics Concern  . Not on file  Social History Narrative  . Not on file   Social Determinants of Health   Financial Resource Strain:   . Difficulty of Paying Living Expenses:   Food Insecurity:   . Worried About Charity fundraiser in the Last Year:   . Arboriculturist in the Last Year:   Transportation Needs:   . Film/video editor (Medical):   Marland Kitchen Lack of Transportation (Non-Medical):   Physical Activity:   . Days of Exercise per Week:   . Minutes of Exercise per Session:   Stress:   . Feeling of Stress :   Social Connections:   . Frequency of Communication with Friends and Family:   . Frequency of Social Gatherings with Friends and Family:   . Attends Religious Services:   . Active Member of  Clubs or Organizations:   . Attends Archivist Meetings:   Marland Kitchen Marital Status:   Intimate Partner Violence:   . Fear of Current or Ex-Partner:   . Emotionally Abused:   Marland Kitchen Physically Abused:   . Sexually Abused:      Review of Systems    General:  No chills, fever, night sweats or weight changes.  Cardiovascular:  No chest pain, dyspnea on exertion, edema, orthopnea, palpitations, paroxysmal nocturnal dyspnea. Dermatological: No rash, lesions/masses Respiratory: No cough, dyspnea Urologic: No hematuria, dysuria Abdominal:   No nausea, vomiting, diarrhea, bright red blood per rectum, melena, or hematemesis Neurologic:  No visual changes, wkns, changes in mental status. All other systems reviewed and are otherwise negative except as noted above.  Physical Exam    VS:  BP (!) 134/58   Pulse (!) 58   Ht 5\' 6"  (1.676 Mcdaniel)   Wt 154 lb (69.9 kg)   SpO2 96%   BMI 24.86 kg/Mcdaniel  , BMI Body mass index is 24.86 kg/Mcdaniel. GEN: Well nourished, well developed, in no acute distress. HEENT: normal. Neck: Supple, no JVD, carotid bruits, or masses. Cardiac: RRR, no murmurs, rubs, or gallops. No clubbing, cyanosis, edema.  Radials/DP/PT 2+ and equal bilaterally.  Respiratory:  Respirations regular and unlabored, clear to auscultation bilaterally. GI: Soft, nontender, nondistended, BS + x 4. MS: no deformity or atrophy. Skin: warm and dry, no rash. Neuro:  Strength and sensation are intact. Psych: Normal affect.  Accessory Clinical Findings    ECG personally reviewed by me today-sinus bradycardia left bundle branch block 59 bpm- No acute changes  EKG 01/04/2020 Sinus rhythm with first-degree AV block 64 bpm  Echocardiogram 08/23/2013 Study Conclusions   - Left ventricle: The cavity size was mildly dilated. Wall    thickness was increased in a pattern of moderate LVH.    Systolic function was normal. The estimated ejection    fraction was in  the range of 50% to 55%.  - Aortic valve:  Trivial regurgitation.  - Mitral valve: Normal appearing mechanical MVR with no peri    valvular leak  - Left atrium: The atrium was moderately to severely    dilated.  - Right atrium: The atrium was mildly dilated.  - Atrial septum: No defect or patent foramen ovale was    identified.   Stress test 08/31/2013 Overall Impression:  Normal stress nuclear study.   LV Ejection Fraction: 49%.  LV Wall Motion:  Paradoxican septal motion, otherwise no regional wall motion abnormalities, low normal LVEF.  Assessment & Plan   1.  Chest pain-no chest pain today.  Presented to the emergency department on 01/04/20.  EKG showed first-degree AV block 64 bpm no acute changes.  High-sensitivity troponins were 6 and 6.He reported that he woke up that morning at 10 AM with chest pain.  He was unable to describe the pain as sharp or dull or pressure.  He stated it lasted for around 2 hours and then resolved.  He did not have symptoms of dizziness, diaphoresis, nausea, shortness of breath, abdominal pain, numbness, or weakness.  Chest x-ray negative for acute abnormalities.  Had another episode of chest discomfort that lasted for about 2 hours and dissipated with rest.  Described as a dull ache on the left side of his chest with radiation down his left arm.  Pain happened at rest. Heart healthy low-sodium diet Increase physical activity as tolerated Order echocardiogram  Status post mitral valve replacement-MVR 2002.  Previous echo 2015 showed EF of 50-55% and normal functioning mitral valve. Continue Coumadin Continue SBE prophylaxis  History of Mobitz type I second-degree AV block-resolved with reduction in beta-blocker.  Taking Inderal for tremor.  EKG unchanged on 01/04/2020. Continue to monitor  Essential hypertension-BP today 134/58.  Well-controlled at home. Continue propranolol Heart healthy low-sodium diet-salty 6 given Increase physical activity as tolerated Continue to monitor blood pressure and  keep log   Disposition: Follow-up with me in 1 month  Kush Farabee Mcdaniel. Cire Deyarmin NP-C    01/24/2020, 2:22 PM [facility] [place] [address] Office [phone] Fax [phone]

## 2020-01-24 ENCOUNTER — Ambulatory Visit (INDEPENDENT_AMBULATORY_CARE_PROVIDER_SITE_OTHER): Payer: Medicare Other | Admitting: General Practice

## 2020-01-24 ENCOUNTER — Other Ambulatory Visit: Payer: Self-pay

## 2020-01-24 ENCOUNTER — Encounter: Payer: Self-pay | Admitting: General Practice

## 2020-01-24 VITALS — BP 134/58 | HR 58 | Ht 66.0 in | Wt 154.0 lb

## 2020-01-24 DIAGNOSIS — I441 Atrioventricular block, second degree: Secondary | ICD-10-CM | POA: Diagnosis not present

## 2020-01-24 DIAGNOSIS — Z952 Presence of prosthetic heart valve: Secondary | ICD-10-CM | POA: Diagnosis not present

## 2020-01-24 DIAGNOSIS — I10 Essential (primary) hypertension: Secondary | ICD-10-CM

## 2020-01-24 DIAGNOSIS — R079 Chest pain, unspecified: Secondary | ICD-10-CM | POA: Diagnosis not present

## 2020-01-24 NOTE — Patient Instructions (Addendum)
Medication Instructions:   Your physician recommends that you continue on your current medications as directed. Please refer to the Current Medication list given to you today.  *If you need a refill on your cardiac medications before your next appointment, please call your pharmacy*  Lab Work: [place]   If you have labs (blood work) drawn today and your tests are completely normal, you will receive your results only by: Marland Kitchen MyChart Message (if you have MyChart) OR . A paper copy in the mail If you have any lab test that is abnormal or we need to change your treatment, we will call you to review the results.   Testing/Procedures: Your physician has requested that you have an echocardiogram. Echocardiography is a painless test that uses sound waves to create images of your heart. It provides your doctor with information about the size and shape of your heart and how well your heart's chambers and valves are working. This procedure takes approximately one hour. There are no restrictions for this procedure.   Follow-Up: At [facility], you and your health needs are our priority.  As part of our continuing mission to provide you with exceptional heart care, we have created designated Provider Care Teams.  These Care Teams include your primary Cardiologist (physician) and Advanced Practice Providers (APPs -  Physician Assistants and Nurse Practitioners) who all work together to provide you with the care you need, when you need it.  We recommend signing up for the patient portal called "MyChart".  Sign up information is provided on this After Visit Summary.  MyChart is used to connect with patients for Virtual Visits (Telemedicine).  Patients are able to view lab/test results, encounter notes, upcoming appointments, etc.  Non-urgent messages can be sent to your provider as well.   To learn more about what you can do with MyChart, go to [email or web address].    Your next appointment:    1 month(s)  The format for your next appointment:   In Person  Provider:  You may see  Coletta Memos NP  Other Instructions

## 2020-02-13 ENCOUNTER — Ambulatory Visit (HOSPITAL_COMMUNITY): Payer: Medicare Other | Attending: Cardiology

## 2020-02-13 ENCOUNTER — Other Ambulatory Visit: Payer: Self-pay

## 2020-02-13 DIAGNOSIS — R079 Chest pain, unspecified: Secondary | ICD-10-CM | POA: Diagnosis present

## 2020-02-19 DIAGNOSIS — R310 Gross hematuria: Secondary | ICD-10-CM | POA: Insufficient documentation

## 2020-02-22 NOTE — Progress Notes (Signed)
Cardiology Clinic Note   Patient Name: Nathan Mcdaniel Date of Encounter: 02/23/2020  Primary Care Provider:  Chesley Noon, MD Primary Cardiologist:  Peter Martinique, MD  Patient Profile    Nathan Mcdaniel 79 year old male presents today for a follow-up of his chest pain.  Past Medical History    Past Medical History:  Diagnosis Date  . Abnormal ECG   . Cancer ([place])    skin  . Generalized anxiety disorder   . Headache   . Hypercholesterolemia   . Hypertension   . Major depressive disorder   . 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 Mitral insufficiency   . Mobitz type 1 second degree atrioventricular block   . Palpitations   . 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 Stroke [facility])    " 2 mild" per spouse  . Wears glasses    Past Surgical History:  Procedure Laterality Date  . CATARACT EXTRACTION, BILATERAL    . CHOLECYSTECTOMY    . COLON SURGERY     with diversion and resection  . FALSE ANEURYSM REPAIR N/A 09/23/2019   Procedure: REPAIR FALSE ANEURYSM FOREHEAD;  Surgeon: Elam Dutch, MD;  Location: [place];  Service: Vascular;  Laterality: N/A;  . MITRAL VALVE REPLACEMENT  2002   with a st.jude mechanical valve  . PROSTATE BIOPSY      Allergies  Allergies  Allergen Reactions  . Donepezil Hcl Other (See Comments)    Hallucinations   . Memantine Hcl Other (See Comments)    Hallucinations     History of Present Illness    [name] has a PMH of hypertension, mitral insufficiency (status post MVR), Mobitz type I, palpitations, hypercholesterolemia, mild neurocognitive disorder, major depressive disorder and generalized anxiety disorder.  He was last seen by Dr. Martinique on 10/04/2019 for follow-up of his mitral valve disease.  He underwent mechanical MVR in 2002.  He was noted to have a history of PACs.  January 2015 he was noted to have marked ST/T wave changes on EKG.  An echocardiogram and nuclear stress  test were done which showed good results.  He was also noted to have second-degree AV block and metoprolol dosing was reduced.  He has since been switched to Inderal for management of a tremor.  In January 2021 he developed pseudoaneurysm of the temporal artery follow-up by punch biopsy of S SCA of the skin.  This was performed by Dr. Oneida Alar.  At that time he reported he was doing well.  He was having trouble with some memory loss.  However, he stated this did not bother him as much due to the fact that he was mostly at home at that time.  He denied bleeding, edema, palpitations, dizziness, chest pain, and dyspnea.  He presented to the emergency department on 01/04/2020 with chest discomfort.  He reported that he woke up that morning at 10 AM with chest pain.  He was unable to describe the pain as sharp or dull or pressure.  He stated it lasted for around 2 hours and then resolved.  He did not have symptoms of dizziness, diaphoresis, nausea, shortness of breath, abdominal pain, numbness, or weakness.  His EKG showed sinus rhythm first-degree AV block 64 bpm.  His high-sensitivity troponins were 6 and 6.  His chest x-ray showed his healed median sternotomy with mitral valve prosthesis, and no acute abnormality of his lungs.  He presented to the clinic  01/24/2020 for follow-up evaluation and stated he felt well.  He presented with his wife who stateed he had some memory deficits.  She indicated that he  had 1 further episode of chest pain that was not as long.  It was described as a dull ache with radiation down his left arm.  This episode  lasted for about 2 hours.  It dissipated with rest.  It came on at rest and he  had no exertional chest discomfort.  I will ordered an echocardiogram for further evaluation and planned follow-up for 1 month.  His echocardiogram 02/13/2020 showed LVEF of 50-55%, moderate LVH, moderately dilated left atria, and normally functioning mitral valve prosthesis.  He presents to the  clinic today for follow-up evaluation and states he feels well.  He continues to push mow his grass and uses weed eater.  He tries to be physically active daily.  His wife states that he is not as physically active as he was 2 years ago but continues to do chores outside.  His blood pressures been well controlled at home in the 130s over 70s-80s.  They eat a low-sodium diet and do not add any sodium to their food/cooking.  I have reviewed his echocardiogram and him and his wife expressed understanding.  I will give him the salty 6 diet sheet, have him maintain his physical activity and follow-up with Dr. Martinique in 3 months.  Today he denies chest pain, shortness of breath, lower extremity edema, fatigue, palpitations, melena,  hemoptysis, diaphoresis, weakness, presyncope, syncope, orthopnea, and PND.   Home Medications    Prior to Admission medications   Medication Sig Start Date End Date Taking? Authorizing Provider  acetaminophen (TYLENOL) 325 MG tablet Take 650 mg by mouth every 6 (six) hours as needed for moderate pain or headache.    [provider]  buPROPion HCl ER, XL, 450 MG TB24 Take 450 mg by mouth every morning. 12/28/19   [provider]  diclofenac Sodium (VOLTAREN) 1 % GEL Apply 1 application topically 4 (four) times daily as needed (pain).    [provider]  enoxaparin (LOVENOX) 80 MG/0.8ML injection Inject 0.8 mLs (80 mg total) into the skin every 12 (twelve) hours for 7 doses. 09/23/19 09/27/19  Dagoberto Ligas, PA-C  fluticasone (FLONASE) 50 MCG/ACT nasal spray Place 1-2 sprays into both nostrils daily as needed for allergies.  01/26/15   [provider]  LORazepam (ATIVAN) 0.5 MG tablet Take 0.5 mg by mouth every 6 (six) hours as needed for anxiety.     [provider]  omeprazole (PRILOSEC) 20 MG capsule Take 1 capsule (20 mg total) by mouth daily. 01/04/20   Maudie Flakes, MD  propranolol (INDERAL) 10 MG tablet Take 10 mg by mouth 3  (three) times daily.    [provider]  sertraline (ZOLOFT) 50 MG tablet Take 50 mg by mouth every evening.  08/28/14   [provider]  Tamsulosin HCl (FLOMAX) 0.4 MG CAPS Take 0.4 mg by mouth at bedtime.     [provider]  warfarin (COUMADIN) 3 MG tablet TAKE 1/2 - 1 TABLET DAILY OR AS DIRECTED BY COUMADIN CLINIC Patient taking differently: Take 1.5-3 mg by mouth See admin instructions. Take 3 mg in the evening once daily except on Friday evening take 1.5 mg 09/21/17   Martinique, Peter M, MD    Family History    Family History  Problem Relation Age of Onset  . Heart failure Mother   .  Hypertension Mother   . Lung cancer Sister    He indicated that his mother is deceased. He indicated that his father is deceased. He indicated that two of his three sisters are alive. He indicated that his maternal grandmother is deceased. He indicated that his maternal grandfather is deceased. He indicated that his paternal grandmother is deceased. He indicated that his paternal grandfather is deceased.  Social History    Social History   Socioeconomic History  . Marital status: Married    Spouse name: Not on file  . Number of children: 2  . Years of education: 28  . Highest education level: Master's degree (e.g., MA, MS, MEng, MEd, MSW, MBA)  Occupational History  . 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 Smoking status: Former Smoker    Packs/day: 0.80    Years: 18.00    Pack years: 14.40    Types: Cigarettes    Quit date: 08/05/1979    Years since quitting: 40.5  . Smokeless tobacco: Never Used  Vaping Use  . Vaping Use: Never used  Substance and Sexual Activity  . Alcohol use: Yes    Comment: occasional glass of wine  . Drug use: No  . Sexual activity: Not on file  Other Topics Concern  . Not on file  Social History Narrative  . Not on file   Social Determinants of Health   Financial Resource Strain:     . Difficulty of Paying Living Expenses:   Food Insecurity:   . Worried About Charity fundraiser in the Last Year:   . Arboriculturist in the Last Year:   Transportation Needs:   . Film/video editor (Medical):   Marland Kitchen Lack of Transportation (Non-Medical):   Physical Activity:   . Days of Exercise per Week:   . Minutes of Exercise per Session:   Stress:   . Feeling of Stress :   Social Connections:   . Frequency of Communication with Friends and Family:   . Frequency of Social Gatherings with Friends and Family:   . Attends Religious Services:   . Active Member of Clubs or Organizations:   . Attends Archivist Meetings:   Marland Kitchen Marital Status:   Intimate Partner Violence:   . Fear of Current or Ex-Partner:   . Emotionally Abused:   Marland Kitchen Physically Abused:   . Sexually Abused:      Review of Systems    General:  No chills, fever, night sweats or weight changes.  Cardiovascular:  No chest pain, dyspnea on exertion, edema, orthopnea, palpitations, paroxysmal nocturnal dyspnea. Dermatological: No rash, lesions/masses Respiratory: No cough, dyspnea Urologic: No hematuria, dysuria Abdominal:   No nausea, vomiting, diarrhea, bright red blood per rectum, melena, or hematemesis Neurologic:  No visual changes, wkns, changes in mental status. All other systems reviewed and are otherwise negative except as noted above.  Physical Exam    VS:  BP (!) 148/82   Pulse 51   Ht 5' 7.5" (1.715 m)   Wt 156 lb (70.8 kg)   SpO2 96%   BMI 24.07 kg/m  , BMI Body mass index is 24.07 kg/m. GEN: Well nourished, well developed, in no acute distress. HEENT: normal. Neck: Supple, no JVD, carotid bruits, or masses. Cardiac: RRR, no murmurs, rubs, or gallops. No clubbing, cyanosis, edema.  Radials/DP/PT 2+ and equal bilaterally.  Respiratory:  Respirations regular and unlabored, clear to auscultation bilaterally.  GI: Soft, nontender, nondistended, BS + x 4. MS: no deformity or  atrophy. Skin: warm and dry, no rash. Neuro:  Strength and sensation are intact. Psych: Normal affect.  Accessory Clinical Findings    Recent Labs: 01/04/2020: BUN 15; Creatinine, Ser 1.04; Hemoglobin 14.1; Platelets 238; Potassium 4.5; Sodium 138   Recent Lipid Panel No results found for: CHOL, TRIG, HDL, CHOLHDL, VLDL, LDLCALC, LDLDIRECT  ECG personally reviewed by me today-none today. EKG 01/24/2020 sinus bradycardia with left bundle branch block 59 bpm  EKG 01/04/2020 Sinus rhythm with first-degree AV block 64 bpm  Echocardiogram 08/23/2013 Study Conclusions   - Left ventricle: The cavity size was mildly dilated. Wall  thickness was increased in a pattern of moderate LVH.  Systolic function was normal. The estimated ejection  fraction was in the range of 50% to 55%.  - Aortic valve: Trivial regurgitation.  - Mitral valve: Normal appearing mechanical MVR with no peri  valvular leak  - Left atrium: The atrium was moderately to severely  dilated.  - Right atrium: The atrium was mildly dilated.  - Atrial septum: No defect or patent foramen ovale was  identified.   Stress test 08/31/2013 Overall Impression: Normal stress nuclear study.  LV Ejection Fraction: 49%. LV Wall Motion: Paradoxican septal motion, otherwise no regional wall motion abnormalities, low normal LVEF.   Echocardiogram 02/13/2020 IMPRESSIONS    1. Left ventricular ejection fraction, by estimation, is 50 to 55%. The  left ventricle has low normal function. The left ventricle demonstrates  global hypokinesis. There is moderate concentric left ventricular  hypertrophy. Left ventricular diastolic  function could not be evaluated.  2. Right ventricular systolic function is normal. The right ventricular  size is normal. There is normal pulmonary artery systolic pressure.  3. Left atrial size was moderately dilated.  4. The mitral valve has been repaired/replaced. No evidence of mitral   valve regurgitation. No evidence of mitral stenosis. The mean mitral valve  gradient is 5.0 mmHg. There is a mechanical valve present in the mitral  position. Echo findings are  consistent with normal structure and function of the mitral valve  prosthesis.  5. The aortic valve is normal in structure. Aortic valve regurgitation is  mild. No aortic stenosis is present.  6. The inferior vena cava is normal in size with greater than 50%  respiratory variability, suggesting right atrial pressure of 3 mmHg.  Assessment & Plan   1.  Chest pain-no chest pain today. Echocardiogram 02/13/2020 showed LVEF of 50-55%, moderate LVH, moderately dilated left atria, and normally functioning mitral valve prosthesis.   No further episodes of chest discomfort Heart healthy low-sodium diet Increase physical activity as tolerated  Status post mitral valve replacement-MVR 2002.    Echocardiogram 02/13/2020 2015  showed LVEF of 50-55%, moderate LVH, moderately dilated left atria, and normally functioning mitral valve prosthesis. Continue Coumadin Continue SBE prophylaxis  History of Mobitz type I second-degree AV block-resolved with reduction in beta-blocker.  Taking Inderal for tremor.  EKG unchanged on 01/04/2020. Continue to monitor  Essential hypertension-BP today 148/82. Well-controlled at home 130s over 70s/80s. Continue propranolol Heart healthy low-sodium diet-salty 6 given Increase physical activity as tolerated Continue to monitor blood pressure and keep log   Disposition: Follow-up with Dr. Martinique in 3 months.   Jossie Ng. Owenn Rothermel NP-C    02/23/2020, 12:04 PM [address] Office [phone] Fax [phone]

## 2020-02-23 ENCOUNTER — Encounter: Payer: Self-pay | Admitting: General Practice

## 2020-02-23 ENCOUNTER — Other Ambulatory Visit: Payer: Self-pay

## 2020-02-23 ENCOUNTER — Ambulatory Visit (INDEPENDENT_AMBULATORY_CARE_PROVIDER_SITE_OTHER): Payer: Medicare Other | Admitting: General Practice

## 2020-02-23 VITALS — BP 148/82 | HR 51 | Ht 67.5 in | Wt 156.0 lb

## 2020-02-23 DIAGNOSIS — I441 Atrioventricular block, second degree: Secondary | ICD-10-CM | POA: Diagnosis not present

## 2020-02-23 DIAGNOSIS — I10 Essential (primary) hypertension: Secondary | ICD-10-CM

## 2020-02-23 DIAGNOSIS — Z952 Presence of prosthetic heart valve: Secondary | ICD-10-CM | POA: Diagnosis not present

## 2020-02-23 DIAGNOSIS — R079 Chest pain, unspecified: Secondary | ICD-10-CM

## 2020-02-23 NOTE — Patient Instructions (Signed)
Medication Instructions:  The current medical regimen is effective;  continue present plan and medications as directed. Please refer to the Current Medication list given to you today. *If you need a refill on your cardiac medications before your next appointment, please call your pharmacy*  Special Instructions PLEASE READ AND FOLLOW SALTY 6-ATTACHED  PLEASE MAINTAIN PHYSICAL ACTIVITY AS TOLERATED  Follow-Up: Your next appointment:  3 month(s)  In Person with Peter Martinique, MD  At [facility], you and your health needs are our priority.  As part of our continuing mission to provide you with exceptional heart care, we have created designated Provider Care Teams.  These Care Teams include your primary Cardiologist (physician) and Advanced Practice Providers (APPs -  Physician Assistants and Nurse Practitioners) who all work together to provide you with the care you need, when you need it.  We recommend signing up for the patient portal called "MyChart".  Sign up information is provided on this After Visit Summary.  MyChart is used to connect with patients for Virtual Visits (Telemedicine).  Patients are able to view lab/test results, encounter notes, upcoming appointments, etc.  Non-urgent messages can be sent to your provider as well.   To learn more about what you can do with MyChart, go to [email or web address].

## 2020-05-10 NOTE — Progress Notes (Signed)
Nathan Mcdaniel Date of Birth: 06-24-1941   History of Present Illness: Nathan Mcdaniel is seen for  followup MV disease. He is s/p mechanical MVR in 2002. He has a history of PACs. In January 2015 he was noted to have marked ST-T wave changes on Ecg.Echo and Nuclear stress test were done with good results. He was also noted to have 2nd degree AV block and metoprolol dose was reduced. He has since been switched to Inderal for management of a tremor.   He presented to the emergency department on 01/04/2020 with chest discomfort.  He reported that he woke up that morning at 10 AM with chest pain.  He was unable to describe the pain as sharp or dull or pressure.  He stated it lasted for around 2 hours and then resolved.  He did not have symptoms of dizziness, diaphoresis, nausea, shortness of breath, abdominal pain, numbness, or weakness.  His EKG showed sinus rhythm first-degree AV block 64 bpm.  His high-sensitivity troponins were 6 and 6.  His chest x-ray showed his healed median sternotomy with mitral valve prosthesis, and no acute abnormality of his lungs. Echocardiogram was done for further evaluation .  His echocardiogram 02/13/2020 showed LVEF of 50-55%, moderate LVH, moderately dilated left atria, and normally functioning mitral valve prosthesis.  On follow up today he is seen with his wife. He denies any chest pain, SOB, palpitations or dizziness. Feels well. Reports coumadin has been therapeutic and no bleeding noted.    Current Outpatient Medications on File Prior to Visit  Medication Sig Dispense Refill  . acetaminophen (TYLENOL) 325 MG tablet Take 650 mg by mouth every 6 (six) hours as needed for moderate pain or headache.    Marland Kitchen buPROPion HCl ER, XL, 450 MG TB24 Take 450 mg by mouth every morning.    . diclofenac Sodium (VOLTAREN) 1 % GEL Apply 1 application topically 4 (four) times daily as needed (pain).    . fluticasone (FLONASE) 50 MCG/ACT nasal spray Place 1-2 sprays into both nostrils daily as  needed for allergies.   3  . LORazepam (ATIVAN) 0.5 MG tablet Take 0.5 mg by mouth every 6 (six) hours as needed for anxiety.     Marland Kitchen omeprazole (PRILOSEC) 20 MG capsule Take 1 capsule (20 mg total) by mouth daily. 30 capsule 1  . propranolol (INDERAL) 10 MG tablet Take 10 mg by mouth 3 (three) times daily.    . sertraline (ZOLOFT) 50 MG tablet Take 50 mg by mouth every evening.     . Tamsulosin HCl (FLOMAX) 0.4 MG CAPS Take 0.4 mg by mouth at bedtime.     Marland Kitchen warfarin (COUMADIN) 3 MG tablet TAKE 1/2 - 1 TABLET DAILY OR AS DIRECTED BY COUMADIN CLINIC (Patient taking differently: Take 1.5-3 mg by mouth See admin instructions. Take 3 mg in the evening once daily except on Friday evening take 1.5 mg) 90 tablet 0  . enoxaparin (LOVENOX) 80 MG/0.8ML injection Inject 0.8 mLs (80 mg total) into the skin every 12 (twelve) hours for 7 doses. 5.6 mL 0   No current facility-administered medications on file prior to visit.    Allergies  Allergen Reactions  . Donepezil Hcl Other (See Comments)    Hallucinations   . Memantine Hcl Other (See Comments)    Hallucinations     Past Medical History:  Diagnosis Date  . Abnormal ECG   . Cancer ([employer])    skin  . Generalized anxiety disorder   . Headache   .  Hypercholesterolemia   . Hypertension   . Major depressive disorder   . 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 Mitral insufficiency   . Mobitz type 1 second degree atrioventricular block   . Palpitations   . 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 Wears glasses     Past Surgical History:  Procedure Laterality Date  . CATARACT EXTRACTION, BILATERAL    . CHOLECYSTECTOMY    . COLON SURGERY     with diversion and resection  . FALSE ANEURYSM REPAIR N/A 09/23/2019   Procedure: REPAIR FALSE ANEURYSM FOREHEAD;  Surgeon: Elam Dutch, MD;  Location: [facility];  Service: Vascular;  Laterality: N/A;  . MITRAL  VALVE REPLACEMENT  2002   with a [employer]jude mechanical valve  . PROSTATE BIOPSY      Social History   Tobacco Use  Smoking Status Former Smoker  . Packs/day: 0.80  . Years: 18.00  . Pack years: 14.40  . Types: Cigarettes  . Quit date: 08/05/1979  . Years since quitting: 40.8  Smokeless Tobacco Never Used    Social History   Substance and Sexual Activity  Alcohol Use Yes   Comment: occasional glass of wine    Family History  Problem Relation Age of Onset  . Heart failure Mother   . Hypertension Mother   . Lung cancer Sister     Review of Systems: As noted in history of present illness.  All other systems were reviewed and are negative.  Physical Exam: BP 132/84 (BP Location: Left Arm, Patient Position: Sitting, Cuff Size: Normal)   Pulse (!) 57   Ht 5\' 7"  (1.702 m)   Wt 151 lb (68.5 kg)   BMI 23.65 kg/m  GENERAL:  Well appearing WM in NAD HEENT:  PERRL, EOMI, sclera are clear. Oropharynx is clear. NECK:  No jugular venous distention, carotid upstroke brisk and symmetric, no bruits, no thyromegaly or adenopathy LUNGS:  Clear to auscultation bilaterally CHEST:  Unremarkable HEART:  RRR,  PMI not displaced or sustained, good mechanical MV click.  S2 within normal limits, no S3, no S4: no murmurs ABD:  Soft, nontender. BS +, no masses or bruits. No hepatomegaly, no splenomegaly EXT:  2 + pulses throughout, no edema, no cyanosis no clubbing SKIN:  Warm and dry.  No rashes NEURO:  Alert and oriented x 3. Cranial nerves II through XII intact. PSYCH:  Cognitively intact      LABORATORY DATA: Lab Results  Component Value Date   WBC 5.0 01/04/2020   HGB 14.1 01/04/2020   HCT 44.7 01/04/2020   PLT 238 01/04/2020   GLUCOSE 120 (H) 01/04/2020   NA 138 01/04/2020   K 4.5 01/04/2020   CL 100 01/04/2020   CREATININE 1.04 01/04/2020   BUN 15 01/04/2020   CO2 31 01/04/2020   INR 1.9 (H) 01/04/2020    Labs reviewed from primary care dated 07/12/15: cholesterol 166,  triglycerides 87, LDL 92, HDL 57. From October 30/17:  Glucose 106.  Chemistries, CBC normal.  Dated 05/27/17: Normal CBC, CMET, TSH.  Dated 05/19/18: cholesterol 141, triglycerides 77, HDL 63, LDL 63. PSA 10.9.  Dated 05/28/18: CMET and TSH normal. Dated 04/05/19: Hgb 12.8. CMET normal. TSH normal. Cholesterol 195, triglycerides 128, HDL 51, LDL 121.   Echo 02/13/20: IMPRESSIONS    1. Left ventricular ejection fraction, by estimation, is 50 to 55%. The  left ventricle has low normal function. The left ventricle demonstrates  global hypokinesis. There is moderate concentric left ventricular  hypertrophy. Left ventricular diastolic  function could not be evaluated.  2. Right ventricular systolic function is normal. The right ventricular  size is normal. There is normal pulmonary artery systolic pressure.  3. Left atrial size was moderately dilated.  4. The mitral valve has been repaired/replaced. No evidence of mitral  valve regurgitation. No evidence of mitral stenosis. The mean mitral valve  gradient is 5.0 mmHg. There is a mechanical valve present in the mitral  position. Echo findings are  consistent with normal structure and function of the mitral valve  prosthesis.  5. The aortic valve is normal in structure. Aortic valve regurgitation is  mild. No aortic stenosis is present.  6. The inferior vena cava is normal in size with greater than 50%  respiratory variability, suggesting right atrial pressure of 3 mmHg.    Assessment / Plan: 1. Status post mechanical mitral valve replacement in 2002. Good valve click on exam. Echo in July was satisfactory.  Patient is therapeutic on his Coumadin. We will continue with his current therapy. SBE prophylaxis.   2. Chronic anticoagulation. Adjusted per primary care.   3. History of Mobitz type 1 second degree AV block.  Resolved with reduction in beta blocker dose. On Inderal now and HR is OK  4. Memory loss. No change off Zocor.      Follow up in one year

## 2020-05-17 ENCOUNTER — Ambulatory Visit (INDEPENDENT_AMBULATORY_CARE_PROVIDER_SITE_OTHER): Payer: Medicare Other | Admitting: Cardiology

## 2020-05-17 ENCOUNTER — Encounter: Payer: Self-pay | Admitting: Cardiology

## 2020-05-17 ENCOUNTER — Other Ambulatory Visit: Payer: Self-pay

## 2020-05-17 VITALS — BP 132/84 | HR 57 | Ht 67.0 in | Wt 151.0 lb

## 2020-05-17 DIAGNOSIS — I441 Atrioventricular block, second degree: Secondary | ICD-10-CM | POA: Diagnosis not present

## 2020-05-17 DIAGNOSIS — Z952 Presence of prosthetic heart valve: Secondary | ICD-10-CM | POA: Diagnosis not present

## 2020-05-18 ENCOUNTER — Ambulatory Visit: Payer: Medicare Other | Admitting: Cardiology

## 2020-08-02 ENCOUNTER — Encounter: Payer: Medicare Other | Admitting: Psychology

## 2020-08-07 ENCOUNTER — Encounter: Payer: Self-pay | Admitting: Psychology

## 2020-08-07 ENCOUNTER — Ambulatory Visit: Payer: Medicare Other | Admitting: Psychology

## 2020-08-07 ENCOUNTER — Ambulatory Visit (INDEPENDENT_AMBULATORY_CARE_PROVIDER_SITE_OTHER): Payer: Medicare Other | Admitting: Psychology

## 2020-08-07 ENCOUNTER — Other Ambulatory Visit: Payer: Self-pay

## 2020-08-07 DIAGNOSIS — F330 Major depressive disorder, recurrent, mild: Secondary | ICD-10-CM | POA: Diagnosis not present

## 2020-08-07 DIAGNOSIS — G3184 Mild cognitive impairment, so stated: Secondary | ICD-10-CM | POA: Diagnosis not present

## 2020-08-07 DIAGNOSIS — R4189 Other symptoms and signs involving cognitive functions and awareness: Secondary | ICD-10-CM

## 2020-08-07 DIAGNOSIS — F411 Generalized anxiety disorder: Secondary | ICD-10-CM

## 2020-08-07 NOTE — Progress Notes (Addendum)
   Psychometrician Note   Cognitive testing was administered to Nathan Mcdaniel by Wallace Keller, B.S. (psychometrist) under the supervision of Dr. Newman Nickels, Ph.D., licensed psychologist on 08/07/20. After initially beginning testing procedures, Mr. Hallquist abruptly stopped the evaluation expressing his wish to discontinue. He expressed his opinion that he is aware that he has exhibited cognitive decline since his previous neuropsychological evaluation and did not believe that being able to put an objective assessment of that decline would benefit in his continued care. Attempts were made to convince him that it was in his best interest to complete testing so that Dr. Rosalyn Gess and others responsible for his medical care would have the most up-to-date information. However, he continued to refuse and the evaluation was discontinued. It was asked if he would at least complete mood-related questionnaires. He responded to this request by stating "nope, nope, nope" and then left the testing office.  Mr. Turvey did not appear overtly distressed by the testing session per behavioral observation or responses across self-report questionnaires. Dr. Newman Nickels, Ph.D. checked in with Mr. Aprea as needed to manage any distress related to testing procedures (if applicable). Rest breaks were offered.    Communication between Dr. Newman Nickels, Ph.D. and the psychometrist was ongoing throughout the evaluation and Dr. Newman Nickels, Ph.D. was immediately accessible at all times. Dr. Newman Nickels, Ph.D. provided supervision to the psychometrist on the date of this service to the extent necessary to assure the quality of all services provided.   A total of 20 minutes of billable time were spent face-to-face with Mr. Hatlestad by the psychometrist. This includes both test administration and scoring time. Billing for these services is reflected in the clinical report generated by Dr. Newman Nickels, Ph.D..  This  note reflects time spent with the psychometrician and does not include test scores or any clinical interpretations made by Dr. Milbert Coulter. The full report will follow in a separate note.
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
## 2020-08-09 ENCOUNTER — Encounter: Payer: Medicare Other | Admitting: Psychology

## 2020-08-14 ENCOUNTER — Encounter: Payer: Medicare Other | Admitting: Psychology

## 2020-09-14 ENCOUNTER — Ambulatory Visit (INDEPENDENT_AMBULATORY_CARE_PROVIDER_SITE_OTHER): Payer: Medicare Other | Admitting: Psychology

## 2020-09-14 ENCOUNTER — Encounter: Payer: Self-pay | Admitting: Psychology

## 2020-09-14 ENCOUNTER — Other Ambulatory Visit: Payer: Self-pay

## 2020-09-14 ENCOUNTER — Ambulatory Visit: Payer: Medicare Other | Admitting: Psychology

## 2020-09-14 DIAGNOSIS — G3184 Mild cognitive impairment, so stated: Secondary | ICD-10-CM

## 2020-09-14 DIAGNOSIS — R4189 Other symptoms and signs involving cognitive functions and awareness: Secondary | ICD-10-CM

## 2020-09-14 NOTE — Progress Notes (Signed)
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 presence of minimal sleep dysfunction.  Tables  of Scores:   Note: This summary of test scores accompanies the interpretive report and should not be considered in isolation without reference to the appropriate sections in the text. Descriptors are based on appropriate normative data and may be adjusted based on clinical judgment. The terms "impaired" and "within normal limits (WNL)" are used when a more specific level of functioning cannot be determined. Descriptors refer to the current evaluation only.         Effort Testing:    [facility]   December 2020 Current    Dot Counting Test: --- --- --- Below Expectation  CVLT-III Forced Choice Recognition: --- --- --- Below Expectation        Cognitive Screening:             Raw Score Raw Score Percentile   SLUMS: --- 15/30 --- ---        NAB Screening Battery, Form 1 Standard Score/ T Score Standard Score/ T Score Percentile   Total Score 64 56 <1 Exceptionally Low    Orientation 24/29 16/29 --- ---  Attention Domain 76 60 <1 Exceptionally Low    Digits Forward 49 41 18 Below Average    Digits Backwards 46 41 18 Below  Average    Letters & Numbers A Efficiency 26 24 <1 Exceptionally Low    Letters & Numbers B Efficiency 36 24 <1 Exceptionally Low  Language Domain 75 66 1 Exceptionally Low    Auditory Comprehension 50 19 <1 Exceptionally Low    Naming 19 29 2  Exceptionally Low  Memory Domain 74 72 3 Well Below Average    Shape Learning Immediate Recognition 42 41 18 Below Average    Story Learning Immediate Recall 25 30 2  Well Below Average    Shape Learning Delayed Recognition 44 51 54 Average    Story Learning Delayed Recall 38 24 <1 Exceptionally Low  Spatial Domain 61 68 2 Exceptionally Low    Visual Discrimination 19 36 8 Well Below Average    Design Construction 35 25 1 Exceptionally Low  Executive Functions Domain 88 77 6 Well Below Average    Mazes 40 26 1 Exceptionally Low    Word Generation 47 50 50 Average        Intellectual Functioning:             Standard Score Standard Score Percentile   Test of Premorbid Functioning: 106 101 53 Average        Memory:            Dow Chemical Learning Test (CVLT-III) Brief Form: Raw Score Raw Score (Scaled/Standard Score) Percentile     Total Trials 1-4 15/36 (69) 10/36 (56) <1 Exceptionally Low    Short-Delay Free Recall 2/9 0/9 (1) <1 Exceptionally Low    Long-Delay Free Recall 0/9 0/9 (1) <1 Exceptionally Low    Long-Delay Cued Recall 0/9 2/9 (1) <1 Exceptionally Low      Recognition Hits 4/9 4/9 (5) 5 Well Below Average      False Positive Errors 8 6 (1) <1 Exceptionally Low        Attention/Executive Function:            Trail Making Test (TMT): Raw Score Raw Score (T Score) Percentile     Part A 120 secs.,  0 errors 147 secs.,  0 errors (18) <1 Exceptionally Low    Part B 260 secs.,  4 errors 302 secs.,  2 errors (19) <1 Exceptionally Low  D-KEFS Color Word Interference Test: Raw Score (Scaled Score) Raw Score (Scaled Score) Percentile     Color Naming 47 secs. (6) Pt. 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 (10) --- --- ---      Total Errors 0 errors (13) --- --- ---    Inhibition/Switching 103 secs. (10) --- --- ---      Total Errors 5 errors (9) --- --- ---        D-KEFS Verbal Fluency Test: Raw Score Raw Score (Scaled Score) Percentile     Letter Total Correct 37 50 (15) 95 Well Above Average    Category Total Correct 30 33 (11) 63 Average    Category Switching Total Correct 5 5 (3) 1 Exceptionally Low    Category Switching Accuracy 2 2 (3) 1 Exceptionally Low      Total Set Loss Errors 14 3 (9) 37 Average      Total Repetition Errors 6 7 (7) 16 Below Average        D-KEFS 20 Questions Test: Scaled Score Scaled Score Percentile     Total Weighted Achievement Score 10 11 63 Average    Initial Abstraction Score 12 10 50 Average        Language:            Verbal Fluency Test: Raw Score Raw Score (T Score) Percentile     Phonemic Fluency (FAS) 37 50 (58) 79 Above Average    Animal Fluency 21 15 (43) 25 Average         NAB Language Module, Form 1: T Score T Score Percentile     Naming 27/31 (37) 26/31 (35) 7 Well Below Average        Visuospatial/Visuoconstruction:       Raw Score Raw Score Percentile   Clock Drawing: 8/10 5/10 --- Impaired         Scaled Score Scaled Score Percentile   WAIS-IV Matrix Reasoning: 10 6 9  Below Average        Mood and Personality:       Raw Score Raw Score Percentile   Geriatric Depression Scale: 11 2 --- Within Normal Limits  Geriatric Anxiety Scale: 16 3 --- Minimal    Somatic 8 1 --- Minimal    Cognitive 4 2 --- Minimal    Affective 4 0 --- Minimal        Additional Questionnaires:       Raw Score Raw Score Percentile   PROMIS Sleep Disturbance Questionnaire: 26 8 --- None to Slight   Informed Consent and Coding/Compliance:   Mr. Carlberg was provided with a verbal description of the nature and purpose of the present neuropsychological evaluation. Also reviewed were the foreseeable  risks and/or discomforts and benefits of the procedure, limits of confidentiality, and mandatory reporting requirements of this provider. The patient was given the opportunity to ask questions and receive answers about the evaluation. Oral consent to participate was provided by the patient.   This evaluation was conducted by Christia Reading, Ph.D., licensed clinical neuropsychologist. Mr. Butner completed 120 minutes of cognitive testing and scoring, billed as one unit [phone] and three additional units [phone]. Psychometrist Milana Kidney, B.S., assisted Dr. Melvyn Novas with test administration and scoring procedures. As a separate and discrete service, Dr. Melvyn Novas spent a total of 160 minutes in interpretation and report writing billed as one unit [phone] and two units [id number].

## 2020-09-14 NOTE — Progress Notes (Signed)
   Psychometrician Note   Cognitive testing was administered to Nathan Mcdaniel by Milana Kidney, B.S. (psychometrist) under the supervision of Dr. Christia Reading, Ph.D., licensed psychologist on 09/14/20. Mr. Fredin did not appear overtly distressed by the testing session per behavioral observation or responses across self-report questionnaires. Dr. Christia Reading, Ph.D. checked in with Nathan Mcdaniel as needed to manage any distress related to testing procedures (if applicable). Rest breaks were offered.    The battery of tests administered was selected by Dr. Christia Reading, Ph.D. with consideration to Nathan Mcdaniel's current level of functioning, the nature of his symptoms, emotional and behavioral responses during interview, level of literacy, observed level of motivation/effort, and the nature of the referral question. This battery was communicated to the psychometrist. Communication between Dr. Christia Reading, Ph.D. and the psychometrist was ongoing throughout the evaluation and Dr. Christia Reading, Ph.D. was immediately accessible at all times. Dr. Christia Reading, Ph.D. provided supervision to the psychometrist on the date of this service to the extent necessary to assure the quality of all services provided.    Nathan Mcdaniel will return within approximately 1-2 weeks for an interactive feedback session with Dr. Melvyn Novas at which time his test performances, clinical impressions, and treatment recommendations will be reviewed in detail. Nathan Mcdaniel understands he can contact our office should he require our assistance before this time.  A total of 120 minutes of billable time were spent face-to-face with Nathan Mcdaniel by the psychometrist. This includes both test administration and scoring time. Billing for these services is reflected in the clinical report generated by Dr. Christia Reading, Ph.D..  This note reflects time spent with the psychometrician and does not include test scores or any clinical  interpretations made by Dr. Melvyn Novas. The full report will follow in a separate note.

## 2020-09-17 ENCOUNTER — Encounter: Payer: Self-pay | Admitting: Psychology

## 2020-09-27 ENCOUNTER — Ambulatory Visit (INDEPENDENT_AMBULATORY_CARE_PROVIDER_SITE_OTHER): Payer: Medicare Other | Admitting: Psychology

## 2020-09-27 ENCOUNTER — Other Ambulatory Visit: Payer: Self-pay

## 2020-09-27 DIAGNOSIS — G3184 Mild cognitive impairment, so stated: Secondary | ICD-10-CM

## 2020-09-27 NOTE — Patient Instructions (Signed)
The primary difference between mild cognitive impairment and dementia is that the latter is associated with loss of functional independence. Should Nathan Mcdaniel exhibit increased difficulties performing day-to-day activities (e.g., medication management or driving), this likely suggests that he has transitioned to a dementia designation. Should this need to be documented in the future, a repeat neuropsychological evaluation would be warranted at that time.  Nathan Mcdaniel reported experiencing negative side effects while taking both Aricept and Namenda. He should discuss other medication options with his neurologist to treat ongoing memory loss and cognitive decline. It is important to note that these medications can slow functional decline in some individuals. If a neurodegenerative illness is indeed present, no current treatment is able to stop or reverse cognitive decline.   Performance across neurocognitive testing is not a strong predictor of an individual's safety operating a motor vehicle. Should he or his family wish to pursue a formalized driving evaluation, they would be encouraged to [employer] in [place], [place] at [phone].Another option would be through [facility]; however, the latter would likely require a referral from a medical doctor. [facility] can be reached directly at [phone].  Should there be a further progression of his current deficits over time, Nathan Mcdaniel is unlikely to regain any independent living skills lost. Therefore, it is recommended that he remain as involved as possible in all aspects of household chores, finances, and medication management, with supervision to ensure adequate performance. He will likely benefit from the establishment and maintenance of a routine in order to maximize his functional abilities over time.  It will be important for Nathan Mcdaniel to have another person with him when in situations where he may need to  process information, weigh the pros and cons of different options, and make decisions, in order to ensure that he fully understands and recalls all information to be considered.  If not already done, Nathan Mcdaniel and his family may want to discuss his wishes regarding durable power of attorney and medical decision making, so that he can have input into these choices. Additionally, they may wish to discuss future plans for caretaking and seek out community options for in home/residential care should they become necessary.  Important information to remember should be provided in written format in all instances. This should be placed in a highly visible and commonly frequented area of his residence to help promote recall.   To address problems with fluctuating attention and executive dysfunction, he may wish to consider:   -Avoiding external distractions when needing to concentrate   -Limiting exposure to fast paced environments with multiple sensory demands   -Writing down complicated information and using checklists   -Attempting and completing one task at a time (i.e., no multi-tasking)   -Verbalizing aloud each step of a task to maintain focus   -Taking frequent breaks during the completion of steps/tasks to avoid fatigue   -Reducing the amount of information considered at one time   -Scheduling more difficult activities for a time of day where he is usually most alert

## 2020-09-27 NOTE — Progress Notes (Signed)
   Neuropsychology Feedback Session Tillie Rung. 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  Feedback:   Mr. Schoch completed a comprehensive neuropsychological evaluation on 09/14/2020. Please refer to that encounter for the full report and recommendations. Briefly, results suggested continued severe impairments across processing speed, cognitive flexibility, confrontation naming, and learning and memory (especially verbal information). Relative to his previous evaluation, performance declines were exhibited across several cognitive domains, including receptive language, visuospatial/constructional, retrieval aspects of verbal memory, cognitive flexibility, and attention/concentration (the latter to a milder extent). Mr. Bugaj test results continue to be at least partially consistent with a vascular etiology given significant deficits in processing speed, cognitive flexibility, and encoding/retrieval aspects of memory. Prior neuroimaging suggested small vessel ischemic changes and a history of two left cerebellar strokes, which would be further consistent with this etiology. Despite this, evidence for cognitive decline increases concerns surrounding an underlying neurodegenerative illness. 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 His pattern of impairment across retrieval/consolidation aspects of verbal memory and confrontation naming, a discrepancy between phonemic and semantic fluency scores (with the latter being worse), and evidence for decline across executive functioning and visuospatial abilities would be consistent  with this condition. 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 The fact that he has remained in the mild cognitive disorder designation could suggest that his trajectory of deficits is relatively slow.  Mr. Tal was accompanied by his wife during the current feedback session. Content of the current session focused on the results of his neuropsychological evaluation. Mr. Pieczynski and his wife were given the opportunity to ask questions and their questions were answered. They were encouraged to reach out should additional questions arise. A copy of his report was provided at the conclusion of the visit.      24 minutes were spent conducting the current feedback session with Mr. Siler, billed as one unit [phone].

## 2020-10-17 ENCOUNTER — Ambulatory Visit: Payer: Medicare Other | Admitting: Cardiology

## 2020-11-02 ENCOUNTER — Telehealth: Payer: Self-pay

## 2020-11-02 DIAGNOSIS — G3184 Mild cognitive impairment, so stated: Secondary | ICD-10-CM

## 2020-11-02 DIAGNOSIS — R42 Dizziness and giddiness: Secondary | ICD-10-CM

## 2020-11-02 NOTE — Telephone Encounter (Signed)
Spoke to patient's wife she stated husband has been having dizziness,pressure in head.He leans to left when he walks.Stated he saw neuro at [facility] this past Monday.They are not satisfied with his care.No one addressed his symptoms.Dr.Jordan advised to see Neuro.Order placed to see [facility].Advised [facility] will call with appointment.Advised if he becomes worse over the weekend to go to ED.

## 2020-11-02 NOTE — Telephone Encounter (Signed)
Called patient wife, after receiving notifications via her mychart account- conversation below: Patient wife was advised to contact neurology- or go to nearest ED to have him checked and possibly have another MRI scan completed. Patient wife states that this has worsened since last visit with Neuro in February and they feel that something is going on. I advised I would notify Dr.Jordan of conversation and make him aware of situation. Patient wife will call Neurology and if no response take him to ED.     Peterson Ao to [place], Peter M, MD      9:50 AM The memory issues are not new.  I don't see any evidence of drooping in his face but he has said that he feels like he leans to one side when he walks.  Not new however.  It's that we have been looking for help with this and so far, no answers.  In an MRI that he had a few years ago it showed a few minor strokes.  I don't know if you can access his test results but possibly it would help.     Darrell Jewel, RN to [facility] E      9:36 AM Are the memory issues something new or IF not has anything worsen suddenly? Does he have any new weakness on one side of his body or does his face have drooping on one side? Is he leaning to one side when his is sitting?  Thank you  Otila Kluver   Last read by Peterson Ao at 9:46 AM on 11/02/2020.      9:29 AM Pugh, Lucianne Muss D, LPN routed this conversation to Darrell Jewel, RN        8:26 AM Waylan Rocher, LPN routed this conversation to [place], Eden Lathe, LPN    Knoebel, Haze Justin to [place], Peter M, MD      8:19 AM Otila Kluver,  His blood pressure and heart rate are good.  Blood pressure was 139/65 on Monday.  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  I think Helene Kelp was thinking about arteries getting clogged tho she didn't say that.  I'm probably over thinking !  Memory issues are a problem but this is  very real.  He seems to be off balance and not just when he stands up.  His date of birth is 1940/10/04.    Thanks   Darrell Jewel, RN to Peterson Ao      7:49 AM Ralph Leyden,  Do you have any of his vital signs over the last week including heart rate and blood pressure? Has anything else changed in his day to day life recently? When does he get dizzy, time of day or any triggers you can think of? What is his date of birth so we can look him up because we are under your name?  Thank you  Otila Kluver   Last read by Peterson Ao at 9:46 AM on 11/02/2020. November 01, 2020  Peterson Ao to [place], Peter M, MD      5:17 PM Dr. [place],  Eduardo is having a lot of dizziness and the feeling of pressure in his head.  So far we have no answers but Vicenta Aly suggested that maybe this is in your area.  Would he be able to see you and have you try to give him an answer or suggestion?  Thank you for your help  Jaegar Croft for [employer]

## 2020-11-02 NOTE — Telephone Encounter (Signed)
Sounds like he is having more Neurologic decline than anything. I think he needs to start with reevaluation by Neuro. It would be good to monitor his HR and BP and let us know if abnormal.  Hoke Baer [place] MD, [facility]

## 2020-11-05 ENCOUNTER — Other Ambulatory Visit: Payer: Self-pay

## 2020-11-05 DIAGNOSIS — G3184 Mild cognitive impairment, so stated: Secondary | ICD-10-CM

## 2020-11-05 DIAGNOSIS — R42 Dizziness and giddiness: Secondary | ICD-10-CM

## 2020-11-05 NOTE — Telephone Encounter (Signed)
Received a message from [facility] Neurology patient already established with [facility].Order placed for patient to see [facility].

## 2020-11-07 ENCOUNTER — Encounter: Payer: Self-pay | Admitting: Neurology

## 2020-11-13 ENCOUNTER — Ambulatory Visit (INDEPENDENT_AMBULATORY_CARE_PROVIDER_SITE_OTHER): Payer: Medicare Other | Admitting: Neurology

## 2020-11-13 ENCOUNTER — Encounter: Payer: Self-pay | Admitting: Neurology

## 2020-11-13 ENCOUNTER — Other Ambulatory Visit: Payer: Self-pay

## 2020-11-13 VITALS — BP 111/68 | HR 76 | Ht 68.0 in | Wt 160.4 lb

## 2020-11-13 DIAGNOSIS — G3184 Mild cognitive impairment, so stated: Secondary | ICD-10-CM | POA: Diagnosis not present

## 2020-11-13 DIAGNOSIS — F330 Major depressive disorder, recurrent, mild: Secondary | ICD-10-CM

## 2020-11-13 MED ORDER — RIVASTIGMINE TARTRATE 1.5 MG PO CAPS: 1.5000 mg | ORAL_CAPSULE | Freq: Two times a day (BID) | ORAL | 11 refills | Status: DC

## 2020-11-13 NOTE — Progress Notes (Signed)
NEUROLOGY CONSULTATION NOTE  Nathan Mcdaniel MRN: [id number] DOB: 04-Jan-1941  Referring provider: Dr. Peter Martinique Primary care provider: Dr. Anastasia Pall  Reason for consult:  Mild Neurocognitive Disorder  Dear Dr Martinique:  Thank you for your kind referral of Nathan Mcdaniel for consultation of the above symptoms. Although his history is well known to you, please allow me to reiterate it for the purpose of our medical record. The patient was accompanied to the clinic by his wife who also provides collateral information. Records and images were personally reviewed where available.   HISTORY OF PRESENT ILLNESS: This is a pleasant 80 year old right-handed man with a history of hypertension, hyperlipidemia, second degree AV block, mitral valve replacement, depression, anxiety, presenting for evaluation of memory loss. He had been seeing neurologist Dr. Shela Leff, records reviewed. He underwent Neuropsychological testing in February 2022 with a  Diagnosis of Mild Neurocognitive Disorder. 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 It is possible that below expectation scores are directly related to true cogntiive dysfunction rather than poor engagement and although results are not invalid, they should be interpreted with mild degree of caution. Pattern of performance suggestive of continued severe impairments across processing speed, cognitive flexibility, confrontation naming, and learning and memory. Compared to prior testing, there were declines across several domains. He denied difficulties with ADLs meeting criteria for Mild Neurocognitive Disorder, but toward severe end of the spectrum. 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  He had side effects on Donepezil and Memantine in the past.  He is accompanied by his  wife of 35 years, [name], who helps supplement the history today. He lives with his wife. He states his memory is not what it used to be. He continues to manage his own medications without issues, his wife concurs. His wife manages finances. He denies getting lost driving short distances. He denies leaving the stove on. His wife started noticing changes over the past 2-3 years, his short-term memory is definitely affected. He repeats himself. He does not recall where one of their daughter lives (wife reminds she is in [place]). He misplaces things more, "I'm very disorganized." He thinks his mood is "normal," his wife feels he is depressed and he states "I'm a little depressed." No paranoia or hallucinations. Sleep is good, no REM behavior disorder. He has a lot of dreams and gets upset sometimes when he wakes up. No hygiene concerns. His mother had dementia in her 36s. No history of significant head injuries. He rarely drinks alcohol.   He denies any headaches, diplopia, dysarthria/dysphagia, neck/back pain, focal numbness/tingling/weakness, bowel/bladder dysfunction, anosmia. He takes Propranolol for tremors with good response. He denies any dizziness, his wife reminds him he complains of dizziness often. He states it does not last long, mostly occurring when he stands up. He describes a pressure in his head/behind his eyes. Eye exam normal, it bothers him a lot. No falls.   I personally reviewed MRI brain done 06/2018 did not show any acute changes. There were chronic blood products or mineralization in the left parietal lobe seen on gradient imaging, mild to borderline moderate diffuse volume loss, minimal chronic microvascular disease.    PAST MEDICAL HISTORY: Past Medical History:  Diagnosis Date  . Abnormal ECG   . Allergic rhinitis 10/24/2009  . Asymptomatic varicose veins 10/07/2010  . Atrial fibrillation  10/14/2011   Dr Neita Garnet follows and checks inr  . Benign prostatic hyperplasia with  urinary obstruction 10/07/2010  . Elevated prostate specific antigen (PSA) 11/26/2010  . Generalized anxiety disorder   . Headache   . 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 History of skin cancer   . Hypertension   . Major depressive disorder   . Mild neurocognitive disorder, 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  . Mitral insufficiency   . Mobitz type 1 second degree atrioventricular block   . Palpitations   . Pseudoaneurysm    of forehead  . Pure hypercholesterolemia 10/07/2010  . 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  . Stroke 06/09/2018   Brain MRI revealed two remote tiny infarcts in the left cerebellum  . Vitamin D deficiency 10/07/2010    PAST SURGICAL HISTORY: Past Surgical History:  Procedure Laterality Date  . CATARACT EXTRACTION, BILATERAL    . CHOLECYSTECTOMY    . COLON SURGERY     with diversion and resection  . FALSE ANEURYSM REPAIR N/A 09/23/2019   Procedure: REPAIR FALSE ANEURYSM FOREHEAD;  Surgeon: Elam Dutch, MD;  Location: [facility];  Service: Vascular;  Laterality: N/A;  . MITRAL VALVE REPLACEMENT  2002   with a st.jude mechanical valve  . PROSTATE BIOPSY      MEDICATIONS: Current Outpatient Medications on File Prior to Visit  Medication Sig Dispense Refill  . acetaminophen (TYLENOL) 325 MG tablet Take 650 mg by mouth every 6 (six) hours as needed for moderate pain or headache.    . diclofenac Sodium (VOLTAREN) 1 % GEL Apply 1 application topically 4 (four) times daily as needed (pain).    . fluticasone (FLONASE) 50 MCG/ACT nasal spray Place 1-2 sprays into both nostrils daily as needed for allergies.   3  . LORazepam (ATIVAN) 0.5 MG tablet Take 0.5 mg by mouth every 6 (six) hours as needed for anxiety.    . propranolol (INDERAL) 10 MG tablet Take 10 mg by mouth 3 (three) times daily.    . sertraline (ZOLOFT) 50 MG tablet Take 50 mg by mouth every evening.     . tamsulosin (FLOMAX) 0.4 MG CAPS capsule Take 0.4 mg by mouth at bedtime.    Marland Kitchen  warfarin (COUMADIN) 3 MG tablet TAKE 1/2 - 1 TABLET DAILY OR AS DIRECTED BY COUMADIN CLINIC (Patient taking differently: Take 1.5-3 mg by mouth See admin instructions. Take 3 mg in the evening once daily except on Friday evening take 1.5 mg) 90 tablet 0   No current facility-administered medications on file prior to visit.    ALLERGIES: Allergies  Allergen Reactions  . Donepezil Hcl Other (See Comments)    Hallucinations   . Memantine Hcl Other (See Comments)    Hallucinations     FAMILY HISTORY: Family History  Problem Relation Age of Onset  . Heart failure Mother   . Hypertension Mother   . Lung cancer Sister     SOCIAL HISTORY: Social History   Socioeconomic History  . Marital status: Married    Spouse name: Not on file  . Number of children: 2  . Years of education: 81  . Highest education level: Master's degree (e.g., MA, MS, MEng, MEd, MSW, MBA)  Occupational History  . 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 Smoking status: Former Smoker    Packs/day: 0.80    Years: 18.00    Pack years: 14.40    Types: Cigarettes  Quit date: 08/05/1979    Years since quitting: 41.3  . Smokeless tobacco: Never Used  Vaping Use  . Vaping Use: Never used  Substance and Sexual Activity  . Alcohol use: Yes    Comment: occasional glass of wine  . Drug use: No  . Sexual activity: Not on file  Other Topics Concern  . Not on file  Social History Narrative   Right handed    Lives with wife    Social Determinants of Health   Financial Resource Strain: Not on file  Food Insecurity: Not on file  Transportation Needs: Not on file  Physical Activity: Not on file  Stress: Not on file  Social Connections: Not on file  Intimate Partner Violence: Not on file     PHYSICAL EXAM: Vitals:   11/13/20 1248  BP: 111/68  Pulse: 76  SpO2: 97%   General: No acute distress Head:  Normocephalic/atraumatic Skin/Extremities: No  rash, no edema Neurological Exam: Mental status: alert and oriented to person, place, and time, no dysarthria or aphasia, Fund of knowledge is appropriate.  Recent and remote memory are intact.  Attention and concentration are normal. Cranial nerves: CN I: not tested CN II: pupils equal, round and reactive to light, visual fields intact CN III, IV, VI:  full range of motion, no nystagmus, no ptosis CN V: facial sensation intact CN VII: upper and lower face symmetric CN VIII: hearing intact to conversation CN IX, X: gag intact, uvula midline CN XI: sternocleidomastoid and trapezius muscles intact CN XII: tongue midline Bulk & Tone: normal, no fasciculations, no cogwheeling Motor: 5/5 throughout with no pronator drift. Sensation: intact to light touch, cold, pin, vibration sense.  No extinction to double simultaneous stimulation.  Romberg test negative Deep Tendon Reflexes: +2 throughout Plantar responses: downgoing bilaterally Cerebellar: no incoordination on finger to nose testing Gait: narrow-based and steady, no ataxia Tremor: no resting tremors, mild postural tremor bilaterally   IMPRESSION: This is a pleasant 80 year old right-handed man with a history of hypertension, hyperlipidemia, second degree AV block, mitral valve replacement, essential tremor, depression, anxiety, presenting for evaluation of memory loss. Repeat Neurocognitive evaluation in February 2022 showed decline across several domains, toward severe end of spectrum of Mild Neurocognitive Disorder. Scores on validity measures were below expectation, however it is possible it is directly related to true cognitive dysfunction rather than poor engagements. 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 Testing findings and MRI discussed with patient and his wife, he had side effects on Donepezil and Memantine. He is agreeable to starting low dose Rivastigmine 1.5mg  BID, side effects and expectations from medication  were discussed. He is agreeable to doing counseling for depression. Continue to monitor driving. We discussed the importance of control of vascular risk factors, physical exercise, and brain stimulation exercises for brain health. Follow-up in 6 months, call for any changes.    Thank you for allowing me to participate in the care of this patient. Please do not hesitate to call for any questions or concerns.   Ellouise Newer, M.D.  CC: Dr. Martinique, Dr. Melford Aase

## 2020-11-13 NOTE — Patient Instructions (Signed)
Good to meet you!  1. Start Rivastigmine 1.5mg : take 1 capsule twice a day  2. Referral will be sent for psychotherapy for depression  3. Follow-up in 6 months, call for any changes   FALL PRECAUTIONS: Be cautious when walking. Scan the area for obstacles that may increase the risk of trips and falls. When getting up in the mornings, sit up at the edge of the bed for a few minutes before getting out of bed. Consider elevating the bed at the head end to avoid drop of blood pressure when getting up. Walk always in a well-lit room (use night lights in the walls). Avoid area rugs or power cords from appliances in the middle of the walkways. Use a walker or a cane if necessary and consider physical therapy for balance exercise. Get your eyesight checked regularly.  HOME SAFETY: Consider the safety of the kitchen when operating appliances like stoves, microwave oven, and blender. Consider having supervision and share cooking responsibilities until no longer able to participate in those. Accidents with firearms and other hazards in the house should be identified and addressed as well.  DRIVING: Regarding driving, in patients with progressive memory problems, driving will be impaired. We advise to have someone else do the driving if trouble finding directions or if minor accidents are reported. Independent driving assessment is available to determine safety of driving.  ABILITY TO BE LEFT ALONE: If patient is unable to contact 911 operator, consider using LifeLine, or when the need is there, arrange for someone to stay with patients. Smoking is a fire hazard, consider supervision or cessation. Risk of wandering should be assessed by caregiver and if detected at any point, supervision and safe proof recommendations should be instituted.  MEDICATION SUPERVISION: Inability to self-administer medication needs to be constantly addressed. Implement a mechanism to ensure safe administration of the  medications.  RECOMMENDATIONS FOR ALL PATIENTS WITH MEMORY PROBLEMS: 1. Continue to exercise (Recommend 30 minutes of walking everyday, or 3 hours every week) 2. Increase social interactions - continue going to [place] and enjoy social gatherings with friends and family 3. Eat healthy, avoid fried foods and eat more fruits and vegetables 4. Maintain adequate blood pressure, blood sugar, and blood cholesterol level. Reducing the risk of stroke and cardiovascular disease also helps promoting better memory. 5. Avoid stressful situations. Live a simple life and avoid aggravations. Organize your time and prepare for the next day in anticipation. 6. Sleep well, avoid any interruptions of sleep and avoid any distractions in the bedroom that may interfere with adequate sleep quality 7. Avoid sugar, avoid sweets as there is a strong link between excessive sugar intake, diabetes, and cognitive impairment We discussed the Mediterranean diet, which has been shown to help patients reduce the risk of progressive memory disorders and reduces cardiovascular risk. This includes eating fish, eat fruits and green leafy vegetables, nuts like almonds and hazelnuts, walnuts, and also use olive oil. Avoid fast foods and fried foods as much as possible. Avoid sweets and sugar as sugar use has been linked to worsening of memory function.  There is always a concern of gradual progression of memory problems. If this is the case, then we may need to adjust level of care according to patient needs. Support, both to the patient and caregiver, should then be put into place.

## 2020-11-26 NOTE — Telephone Encounter (Signed)
Let them know Dr. Delice Lesch is out of the office.  She recommended counseling for the depression.  Have they started that?

## 2020-12-06 NOTE — Progress Notes (Signed)
Nathan Mcdaniel Date of Birth: 1941-07-21   History of Present Illness: Nathan Mcdaniel is seen for  followup MV disease. He is s/p mechanical MVR in 2002. He has a history of PACs. In January 2015 he was noted to have marked ST-T wave changes on Ecg.Echo and Nuclear stress test were done with good results. He was also noted to have 2nd degree AV block and metoprolol dose was reduced. He has since been switched to Inderal for management of a tremor.   He presented to the emergency department on 01/04/2020 with chest discomfort.  He reported that he woke up that morning at 10 AM with chest pain.  He was unable to describe the pain as sharp or dull or pressure.  He stated it lasted for around 2 hours and then resolved.  He did not have symptoms of dizziness, diaphoresis, nausea, shortness of breath, abdominal pain, numbness, or weakness.  His EKG showed sinus rhythm first-degree AV block 64 bpm.  His high-sensitivity troponins were 6 and 6.  His chest x-ray showed his healed median sternotomy with mitral valve prosthesis, and no acute abnormality of his lungs. Echocardiogram was done for further evaluation .  His echocardiogram 02/13/2020 showed LVEF of 50-55%, moderate LVH, moderately dilated left atria, and normally functioning mitral valve prosthesis.  On follow up today he is seen with his wife. He denies any chest pain, SOB, palpitations or dizziness. Feels well. INR has been fluctuating more recently. Wife reports more issues with depression than anything.   Current Outpatient Medications on File Prior to Visit  Medication Sig Dispense Refill  . acetaminophen (TYLENOL) 325 MG tablet Take 650 mg by mouth every 6 (six) hours as needed for moderate pain or headache.    . diclofenac Sodium (VOLTAREN) 1 % GEL Apply 1 application topically 4 (four) times daily as needed (pain).    . fluticasone (FLONASE) 50 MCG/ACT nasal spray Place 1-2 sprays into both nostrils daily as needed for allergies.   3  . LORazepam  (ATIVAN) 0.5 MG tablet Take 0.5 mg by mouth every 6 (six) hours as needed for anxiety.    . propranolol (INDERAL) 10 MG tablet Take 10 mg by mouth 3 (three) times daily.    . rivastigmine (EXELON) 1.5 MG capsule Take 1 capsule (1.5 mg total) by mouth 2 (two) times daily. 60 capsule 11  . sertraline (ZOLOFT) 50 MG tablet Take 3 tablets by mouth daily.    . tamsulosin (FLOMAX) 0.4 MG CAPS capsule Take 0.4 mg by mouth at bedtime.    Marland Kitchen warfarin (COUMADIN) 3 MG tablet TAKE 1/2 - 1 TABLET DAILY OR AS DIRECTED BY COUMADIN CLINIC (Patient taking differently: Take 1.5-3 mg by mouth See admin instructions. Take 3 mg in the evening once daily except on Friday evening take 1.5 mg) 90 tablet 0   No current facility-administered medications on file prior to visit.    Allergies  Allergen Reactions  . Donepezil Hcl Other (See Comments)    Hallucinations   . Memantine Hcl Other (See Comments)    Hallucinations     Past Medical History:  Diagnosis Date  . Abnormal ECG   . Allergic rhinitis 10/24/2009  . Asymptomatic varicose veins 10/07/2010  . Atrial fibrillation 10/14/2011   Dr Neita Garnet follows and checks inr  . 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  . Elevated prostate specific antigen (PSA) 11/26/2010  . Generalized anxiety disorder   . Headache   . 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  History of skin cancer   . Hypertension   . Major depressive disorder   . Mild neurocognitive disorder, 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  . Mitral insufficiency   . Mobitz type 1 second degree atrioventricular block   . Palpitations   . Pseudoaneurysm    of forehead  . Pure hypercholesterolemia 10/07/2010  . 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  . Stroke 06/09/2018   Brain MRI revealed two remote tiny infarcts in the left cerebellum  . Vitamin D deficiency 10/07/2010    Past Surgical History:  Procedure Laterality Date  . CATARACT EXTRACTION, BILATERAL    .  CHOLECYSTECTOMY    . COLON SURGERY     with diversion and resection  . FALSE ANEURYSM REPAIR N/A 09/23/2019   Procedure: REPAIR FALSE ANEURYSM FOREHEAD;  Surgeon: Elam Dutch, MD;  Location: [facility];  Service: Vascular;  Laterality: N/A;  . MITRAL VALVE REPLACEMENT  2002   with a st.jude mechanical valve  . PROSTATE BIOPSY      Social History   Tobacco Use  Smoking Status Former Smoker  . Packs/day: 0.80  . Years: 18.00  . Pack years: 14.40  . Types: Cigarettes  . Quit date: 08/05/1979  . Years since quitting: 41.3  Smokeless Tobacco Never Used    Social History   Substance and Sexual Activity  Alcohol Use Yes   Comment: occasional glass of wine    Family History  Problem Relation Age of Onset  . Heart failure Mother   . Hypertension Mother   . Lung cancer Sister     Review of Systems: As noted in history of present illness.  All other systems were reviewed and are negative.  Physical Exam: BP 126/72   Pulse 68   Ht 5\' 7"  (1.702 m)   Wt 163 lb (73.9 kg)   SpO2 95%   BMI 25.53 kg/m  GENERAL:  Well appearing WM in NAD HEENT:  PERRL, EOMI, sclera are clear. Oropharynx is clear. NECK:  No jugular venous distention, carotid upstroke brisk and symmetric, no bruits, no thyromegaly or adenopathy LUNGS:  Clear to auscultation bilaterally CHEST:  Unremarkable HEART:  RRR,  PMI not displaced or sustained, good mechanical MV click.  S2 within normal limits, no S3, no S4: no murmurs ABD:  Soft, nontender. BS +, no masses or bruits. No hepatomegaly, no splenomegaly EXT:  2 + pulses throughout, no edema, no cyanosis no clubbing SKIN:  Warm and dry.  No rashes NEURO:  Alert and oriented x 3. Cranial nerves II through XII intact. PSYCH:  Cognitively intact      LABORATORY DATA: Lab Results  Component Value Date   WBC 5.0 01/04/2020   HGB 14.1 01/04/2020   HCT 44.7 01/04/2020   PLT 238 01/04/2020   GLUCOSE 120 (H) 01/04/2020   NA 138 01/04/2020   K 4.5  01/04/2020   CL 100 01/04/2020   CREATININE 1.04 01/04/2020   BUN 15 01/04/2020   CO2 31 01/04/2020   INR 1.9 (H) 01/04/2020    Labs reviewed from primary care dated 07/12/15: cholesterol 166, triglycerides 87, LDL 92, HDL 57. From October 30/17:  Glucose 106.  Chemistries, CBC normal.  Dated 05/27/17: Normal CBC, CMET, TSH.  Dated 05/19/18: cholesterol 141, triglycerides 77, HDL 63, LDL 63. PSA 10.9.  Dated 05/28/18: CMET and TSH normal. Dated 04/05/19: Hgb 12.8. CMET normal. TSH normal. Cholesterol 195, triglycerides 128, HDL 51, LDL 121.  Dated 11/05/20: normal UA, CBC, BMET.  Dated 12/06/20: INR 3.8.  Ecg today showed NSR with first degree AV block. Poor R wave progression. I have personally reviewed and interpreted this study.   Echo 02/13/20: IMPRESSIONS    1. Left ventricular ejection fraction, by estimation, is 50 to 55%. The  left ventricle has low normal function. The left ventricle demonstrates  global hypokinesis. There is moderate concentric left ventricular  hypertrophy. Left ventricular diastolic  function could not be evaluated.  2. Right ventricular systolic function is normal. The right ventricular  size is normal. There is normal pulmonary artery systolic pressure.  3. Left atrial size was moderately dilated.  4. The mitral valve has been repaired/replaced. No evidence of mitral  valve regurgitation. No evidence of mitral stenosis. The mean mitral valve  gradient is 5.0 mmHg. There is a mechanical valve present in the mitral  position. Echo findings are  consistent with normal structure and function of the mitral valve  prosthesis.  5. The aortic valve is normal in structure. Aortic valve regurgitation is  mild. No aortic stenosis is present.  6. The inferior vena cava is normal in size with greater than 50%  respiratory variability, suggesting right atrial pressure of 3 mmHg.    Assessment / Plan: 1. Status post mechanical mitral valve replacement in  2002. Good valve click on exam. Echo in July was satisfactory.  Patient is therapeutic on his Coumadin. We will continue with his current therapy. SBE prophylaxis.   2. Chronic anticoagulation. Adjusted per primary care.   3. History of Mobitz type 1 second degree AV block.  Resolved with reduction in beta blocker dose. On Inderal now and HR is OK  4. Memory loss.     Follow up in one year

## 2020-12-10 ENCOUNTER — Encounter: Payer: Self-pay | Admitting: Cardiology

## 2020-12-10 ENCOUNTER — Ambulatory Visit (INDEPENDENT_AMBULATORY_CARE_PROVIDER_SITE_OTHER): Payer: Medicare Other | Admitting: Cardiology

## 2020-12-10 ENCOUNTER — Other Ambulatory Visit: Payer: Self-pay

## 2020-12-10 VITALS — BP 126/72 | HR 68 | Ht 67.0 in | Wt 163.0 lb

## 2020-12-10 DIAGNOSIS — Z7901 Long term (current) use of anticoagulants: Secondary | ICD-10-CM

## 2020-12-10 DIAGNOSIS — I441 Atrioventricular block, second degree: Secondary | ICD-10-CM

## 2020-12-10 DIAGNOSIS — I10 Essential (primary) hypertension: Secondary | ICD-10-CM

## 2020-12-10 DIAGNOSIS — Z952 Presence of prosthetic heart valve: Secondary | ICD-10-CM | POA: Diagnosis not present

## 2020-12-10 NOTE — Telephone Encounter (Signed)
error 

## 2021-01-25 ENCOUNTER — Emergency Department (HOSPITAL_COMMUNITY)
Admission: EM | Admit: 2021-01-25 | Discharge: 2021-01-26 | Disposition: A | Discharge status: Home / Self Care | Payer: Medicare Other | Attending: Emergency Medicine | Admitting: Emergency Medicine

## 2021-01-25 ENCOUNTER — Emergency Department (HOSPITAL_COMMUNITY): Payer: Medicare Other

## 2021-01-25 DIAGNOSIS — W540XXA Bitten by dog, initial encounter: Secondary | ICD-10-CM | POA: Insufficient documentation

## 2021-01-25 DIAGNOSIS — S5011XA Contusion of right forearm, initial encounter: Secondary | ICD-10-CM | POA: Insufficient documentation

## 2021-01-25 DIAGNOSIS — L03113 Cellulitis of right upper limb: Secondary | ICD-10-CM | POA: Insufficient documentation

## 2021-01-25 DIAGNOSIS — S59911A Unspecified injury of right forearm, initial encounter: Secondary | ICD-10-CM | POA: Diagnosis present

## 2021-01-25 DIAGNOSIS — Z87891 Personal history of nicotine dependence: Secondary | ICD-10-CM | POA: Diagnosis not present

## 2021-01-25 DIAGNOSIS — S50812A Abrasion of left forearm, initial encounter: Secondary | ICD-10-CM | POA: Insufficient documentation

## 2021-01-25 DIAGNOSIS — I10 Essential (primary) hypertension: Secondary | ICD-10-CM | POA: Insufficient documentation

## 2021-01-25 DIAGNOSIS — Z79899 Other long term (current) drug therapy: Secondary | ICD-10-CM | POA: Diagnosis not present

## 2021-01-25 DIAGNOSIS — Z85828 Personal history of other malignant neoplasm of skin: Secondary | ICD-10-CM | POA: Insufficient documentation

## 2021-01-25 DIAGNOSIS — Z7901 Long term (current) use of anticoagulants: Secondary | ICD-10-CM | POA: Diagnosis not present

## 2021-01-25 DIAGNOSIS — Z23 Encounter for immunization: Secondary | ICD-10-CM | POA: Diagnosis not present

## 2021-01-25 MED ORDER — LIDOCAINE-EPINEPHRINE (PF) 2 %-1:200000 IJ SOLN
10.0000 mL | Freq: Once | INTRAMUSCULAR | Status: AC
  Administered 2021-01-25: 10 mL
  Filled 2021-01-25: qty 20

## 2021-01-25 MED ORDER — AMOXICILLIN-POT CLAVULANATE 875-125 MG PO TABS: 1.0000 | ORAL_TABLET | Freq: Two times a day (BID) | ORAL | 0 refills | Status: DC

## 2021-01-25 MED ORDER — AMOXICILLIN-POT CLAVULANATE 875-125 MG PO TABS
1.0000 | ORAL_TABLET | Freq: Once | ORAL | Status: AC
  Administered 2021-01-25: 1 via ORAL
  Filled 2021-01-25: qty 1

## 2021-01-25 MED ORDER — BACITRACIN ZINC 500 UNIT/GM EX OINT
TOPICAL_OINTMENT | Freq: Two times a day (BID) | CUTANEOUS | Status: DC
  Administered 2021-01-25: 1 via TOPICAL
  Filled 2021-01-25: qty 1.8

## 2021-01-25 MED ORDER — TETANUS-DIPHTH-ACELL PERTUSSIS 5-2.5-18.5 LF-MCG/0.5 IM SUSY
0.5000 mL | PREFILLED_SYRINGE | Freq: Once | INTRAMUSCULAR | Status: AC
  Administered 2021-01-25: 0.5 mL via INTRAMUSCULAR
  Filled 2021-01-25: qty 0.5

## 2021-01-25 NOTE — Discharge Instructions (Addendum)
Keep wounds clean and covered. You can use a topical antibiotic such as Neosporin when bandaging. Take Augmentin twice daily for 10 days.   See your doctor for recheck of the wounds if there is any concern for infection. The single suture will ned to be removed in 10 days.

## 2021-01-25 NOTE — ED Triage Notes (Signed)
Pt c/o animal bite, on coumadin for artificial valve. Dog belongs to pt, is vaccinated. Pt just here due to [facility] status. Bleeding somewhat controlled in triage

## 2021-01-25 NOTE — ED Provider Notes (Signed)
[name] Provider Note   CSN: [id number] Arrival date & time: 01/25/21  2238     History Chief Complaint  Patient presents with   Animal Bite    Nathan Mcdaniel is a 80 y.o. male.  Patient to ED after his dog attacked him causing multiple puncture wounds to forearms. He states his dog has never been aggressive in the past. He does not feel he did anything to provoke the attack. The dog is immunized against Rabies.   The history is provided by the patient. No language interpreter was used.  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   Mitral insufficiency    Mobitz type 1 second degree atrioventricular block    Palpitations    Pseudoaneurysm    of forehead   Pure hypercholesterolemia 10/07/2010   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   Stroke 06/09/2018   Brain MRI revealed two remote tiny infarcts in the left cerebellum   Vitamin D deficiency 10/07/2010    Patient Active Problem List   Diagnosis Date Noted   Gross hematuria 02/19/2020   Mild neurocognitive disorder, 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   Major depressive disorder    Generalized anxiety disorder    Long term (current) use of anticoagulants 12/17/2017   Mobitz type 1 second degree atrioventricular block    Abnormal ECG    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   Atrial fibrillation 10/14/2011   Hypertension     Palpitations    Mitral insufficiency    Elevated prostate specific antigen (PSA) 11/26/2010   Pure hypercholesterolemia 10/07/2010   Asymptomatic varicose veins 10/07/2010   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   Vitamin D deficiency 10/07/2010   Allergic rhinitis 10/24/2009   Bunion 05/21/2009   Corn or callus 12/26/2008   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    Past Surgical History:  Procedure Laterality Date   CATARACT EXTRACTION, BILATERAL     CHOLECYSTECTOMY     COLON SURGERY     with diversion and resection   FALSE ANEURYSM REPAIR N/A 09/23/2019   Procedure: REPAIR FALSE ANEURYSM FOREHEAD;  Surgeon: Elam Dutch, MD;  Location: [facility] OR;  Service: Vascular;  Laterality: N/A;   MITRAL VALVE REPLACEMENT  2002   with a st.jude mechanical valve   PROSTATE BIOPSY         Family History  Problem Relation Age of Onset   Heart failure Mother    Hypertension Mother    Lung cancer Sister     Social History   Tobacco Use   Smoking status: Former    Packs/day: 0.80    Years: 18.00    Pack years: 14.40    Types: Cigarettes    Quit date: 08/05/1979    Years since quitting: 41.5   Smokeless tobacco: Never  Vaping Use   Vaping Use: Never used  Substance Use Topics   Alcohol  use: Yes    Comment: occasional glass of wine   Drug use: No    Home Medications Prior to Admission medications   Medication Sig Start Date End Date Taking? Authorizing Provider  acetaminophen (TYLENOL) 325 MG tablet Take 650 mg by mouth every 6 (six) hours as needed for moderate pain or headache.    [provider]  diclofenac Sodium (VOLTAREN) 1 % GEL Apply 1 application topically 4 (four) times daily as needed (pain).    [provider]  fluticasone (FLONASE) 50 MCG/ACT nasal spray Place 1-2 sprays into both nostrils daily as needed for allergies.  01/26/15   [provider]  LORazepam (ATIVAN) 0.5 MG tablet Take 0.5 mg by mouth  every 6 (six) hours as needed for anxiety.    [provider]  propranolol (INDERAL) 10 MG tablet Take 10 mg by mouth 3 (three) times daily.    [provider]  rivastigmine (EXELON) 1.5 MG capsule Take 1 capsule (1.5 mg total) by mouth 2 (two) times daily. 11/13/20   Cameron Sprang, MD  sertraline (ZOLOFT) 50 MG tablet Take 3 tablets by mouth daily. 12/03/20   [provider]  tamsulosin (FLOMAX) 0.4 MG CAPS capsule Take 0.4 mg by mouth at bedtime.    [provider]  warfarin (COUMADIN) 3 MG tablet TAKE 1/2 - 1 TABLET DAILY OR AS DIRECTED BY COUMADIN CLINIC Patient taking differently: Take 1.5-3 mg by mouth See admin instructions. Take 3 mg in the evening once daily except on Friday evening take 1.5 mg 09/21/17   Martinique, Peter M, MD    Allergies    Donepezil hcl and Memantine hcl  Review of Systems   Review of Systems  Constitutional:  Negative for chills and diaphoresis.  Musculoskeletal:        See HPI.  Skin:  Positive for wound.  Neurological:  Negative for weakness and numbness.  Hematological:  Bruises/bleeds easily.   Physical Exam Updated Vital Signs BP (!) 162/85 (BP Location: Left Arm)   Pulse 67   Temp 98.7 F (37.1 C) (Oral)   Resp 15   SpO2 95%   Physical Exam Vitals and nursing note reviewed.  Constitutional:      Appearance: Normal appearance.  Pulmonary:     Effort: Pulmonary effort is normal.  Musculoskeletal:     Comments: Right forearm has a moderate hematoma midshaft posterior aspect. There is a full thickness wound 2 cm, gapping, no active bleeding. No FB visualized. There are 3 superficial abrasions to medial forearm. Left forearm has multiple partial thickness linear abrasions dorsally. No active bleeding. FROM all joints of UE's.   Neurological:     Mental Status: He is alert.     Sensory: No sensory deficit.    ED Results / Procedures / Treatments   Labs (all labs ordered are listed, but only abnormal results are  displayed) Labs Reviewed - No data to display  EKG None  Radiology No results found.  Procedures .Marland KitchenLaceration Repair  Date/Time: 01/25/2021 11:37 PM Performed by: Charlann Lange, PA-C Authorized by: Charlann Lange, PA-C   Consent:    Consent obtained:  Verbal   Consent given by:  Patient Universal protocol:    Procedure explained and questions answered to patient or proxy's satisfaction: yes     Immediately prior to procedure, a time out was called: yes     Patient identity confirmed:  Verbally with patient Anesthesia:    Anesthesia method:  Local infiltration   Local  anesthetic:  Lidocaine 2% WITH epi Laceration details:    Location:  Shoulder/arm   Shoulder/arm location:  R lower arm   Length (cm):  2 Pre-procedure details:    Preparation:  Patient was prepped and draped in usual sterile fashion and imaging obtained to evaluate for foreign bodies Exploration:    Contaminated: no   Treatment:    Area cleansed with:  Povidone-iodine and saline   Amount of cleaning:  Extensive Skin repair:    Repair method:  Sutures   Number of sutures:  1 Approximation:    Approximation:  Loose Repair type:    Repair type:  Simple Post-procedure details:    Dressing:  Antibiotic ointment and non-adherent dressing   Procedure completion:  Tolerated well, no immediate complications   Medications Ordered in ED Medications  lidocaine-EPINEPHrine (XYLOCAINE W/EPI) 2 %-1:200000 (PF) injection 10 mL (has no administration in time range)  amoxicillin-clavulanate (AUGMENTIN) 875-125 MG per tablet 1 tablet (has no administration in time range)  bacitracin ointment (has no administration in time range)  Tdap (BOOSTRIX) injection 0.5 mL (has no administration in time range)    ED Course  I have reviewed the triage vital signs and the nursing notes.  Pertinent labs & imaging results that were available during my care of the patient were reviewed by me and considered in my medical decision  making (see chart for details).    MDM Rules/Calculators/A&P                          Patient to ED after his dog bit him causing multiple wounds. On Coumadin with h/o valvular dz.   Imaging obtain to right FA and is negative for fracture or FB. Gapping wound loosely closed with single suture. All wounds cleaned, topical abx applied, and bandaged. Tetanus updated.   Will start Augmentin. Provide wound care instructions.   Final Clinical Impression(s) / ED Diagnoses Final diagnoses:  None   Dog bite  Rx / DC Orders ED Discharge Orders     None        Charlann Lange, PA-C 01/25/21 [place], Ankit, MD 01/26/21 2355

## 2021-01-26 ENCOUNTER — Encounter (HOSPITAL_BASED_OUTPATIENT_CLINIC_OR_DEPARTMENT_OTHER): Payer: Self-pay | Admitting: *Deleted

## 2021-01-26 ENCOUNTER — Emergency Department (HOSPITAL_BASED_OUTPATIENT_CLINIC_OR_DEPARTMENT_OTHER)
Admission: EM | Admit: 2021-01-26 | Discharge: 2021-01-26 | Disposition: A | Discharge status: Home / Self Care | Payer: Medicare Other | Source: Home / Self Care | Attending: Emergency Medicine | Admitting: Emergency Medicine

## 2021-01-26 ENCOUNTER — Other Ambulatory Visit: Payer: Self-pay

## 2021-01-26 DIAGNOSIS — W540XXA Bitten by dog, initial encounter: Secondary | ICD-10-CM

## 2021-01-26 DIAGNOSIS — Z7901 Long term (current) use of anticoagulants: Secondary | ICD-10-CM

## 2021-01-26 DIAGNOSIS — S5011XA Contusion of right forearm, initial encounter: Secondary | ICD-10-CM | POA: Diagnosis not present

## 2021-01-26 DIAGNOSIS — Z87891 Personal history of nicotine dependence: Secondary | ICD-10-CM | POA: Insufficient documentation

## 2021-01-26 DIAGNOSIS — W57XXXD Bitten or stung by nonvenomous insect and other nonvenomous arthropods, subsequent encounter: Secondary | ICD-10-CM | POA: Insufficient documentation

## 2021-01-26 DIAGNOSIS — Z79899 Other long term (current) drug therapy: Secondary | ICD-10-CM | POA: Insufficient documentation

## 2021-01-26 DIAGNOSIS — I10 Essential (primary) hypertension: Secondary | ICD-10-CM | POA: Insufficient documentation

## 2021-01-26 DIAGNOSIS — L03113 Cellulitis of right upper limb: Secondary | ICD-10-CM | POA: Insufficient documentation

## 2021-01-26 DIAGNOSIS — Z85828 Personal history of other malignant neoplasm of skin: Secondary | ICD-10-CM | POA: Insufficient documentation

## 2021-01-26 LAB — CBC WITH DIFFERENTIAL/PLATELET
Abs Immature Granulocytes: 0.03 10*3/uL (ref 0.00–0.07)
Basophils Absolute: 0 10*3/uL (ref 0.0–0.1)
Basophils Relative: 0 %
Eosinophils Absolute: 0 10*3/uL (ref 0.0–0.5)
Eosinophils Relative: 0 %
HCT: 37.5 % — ABNORMAL LOW (ref 39.0–52.0)
Hemoglobin: 12.3 g/dL — ABNORMAL LOW (ref 13.0–17.0)
Immature Granulocytes: 0 %
Lymphocytes Relative: 9 %
Lymphs Abs: 0.7 10*3/uL (ref 0.7–4.0)
MCH: 29.1 pg (ref 26.0–34.0)
MCHC: 32.8 g/dL (ref 30.0–36.0)
MCV: 88.9 fL (ref 80.0–100.0)
Monocytes Absolute: 0.6 10*3/uL (ref 0.1–1.0)
Monocytes Relative: 8 %
Neutro Abs: 6.5 10*3/uL (ref 1.7–7.7)
Neutrophils Relative %: 83 %
Platelets: 204 10*3/uL (ref 150–400)
RBC: 4.22 MIL/uL (ref 4.22–5.81)
RDW: 13.4 % (ref 11.5–15.5)
WBC: 7.9 10*3/uL (ref 4.0–10.5)
nRBC: 0 % (ref 0.0–0.2)

## 2021-01-26 LAB — PROTIME-INR
INR: 2.7 — ABNORMAL HIGH (ref 0.8–1.2)
Prothrombin Time: 28.9 seconds — ABNORMAL HIGH (ref 11.4–15.2)

## 2021-01-26 MED ORDER — OXYCODONE-ACETAMINOPHEN 5-325 MG PO TABS
1.0000 | ORAL_TABLET | Freq: Once | ORAL | Status: AC
  Administered 2021-01-26: 1 via ORAL
  Filled 2021-01-26: qty 1

## 2021-01-26 MED ORDER — SODIUM CHLORIDE 0.9 % IV SOLN
3.0000 g | Freq: Once | INTRAVENOUS | Status: AC
  Administered 2021-01-26: 3 g via INTRAVENOUS
  Filled 2021-01-26: qty 8

## 2021-01-26 NOTE — ED Triage Notes (Signed)
Pt in for wound recheck, bil lower arms with dressings in place. Pt is on coumadin. Seen yesterday at [facility] ED.

## 2021-01-26 NOTE — ED Provider Notes (Signed)
[facility] EMERGENCY DEPT Provider Note   CSN: [id number] Arrival date & time: 01/26/21  1824     History Chief Complaint  Patient presents with   Wound Check    Nathan Mcdaniel is a 80 y.o. male.   Wound Check Pertinent negatives include no chest pain. Patient presents after dog bite recently.  Seen yesterday in the ER.  Had some wound closed.  Started on antibiotics.  Now more swelling in his right forearm.  Some redness.  Is on Coumadin for anticoagulation.  No new injury.  Feels fine overall.  Does have some pain in the forearm.     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   Mitral insufficiency    Mobitz type 1 second degree atrioventricular block    Palpitations    Pseudoaneurysm    of forehead   Pure hypercholesterolemia 10/07/2010   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   Stroke 06/09/2018   Brain MRI revealed two remote tiny infarcts in the left cerebellum   Vitamin D deficiency 10/07/2010    Patient Active Problem List   Diagnosis Date Noted   Gross hematuria 02/19/2020   Mild neurocognitive disorder, 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   Major depressive disorder    Generalized anxiety disorder    Long term (current) use of anticoagulants 12/17/2017   Mobitz type 1 second degree atrioventricular block    Abnormal ECG    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   Atrial fibrillation 10/14/2011   Hypertension    Palpitations    Mitral insufficiency     Elevated prostate specific antigen (PSA) 11/26/2010   Pure hypercholesterolemia 10/07/2010   Asymptomatic varicose veins 10/07/2010   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   Vitamin D deficiency 10/07/2010   Allergic rhinitis 10/24/2009   Bunion 05/21/2009   Corn or callus 12/26/2008   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    Past Surgical History:  Procedure Laterality Date   CATARACT EXTRACTION, BILATERAL     CHOLECYSTECTOMY     COLON SURGERY     with diversion and resection   FALSE ANEURYSM REPAIR N/A 09/23/2019   Procedure: REPAIR FALSE ANEURYSM FOREHEAD;  Surgeon: Elam Dutch, MD;  Location: [facility] OR;  Service: Vascular;  Laterality: N/A;   MITRAL VALVE REPLACEMENT  2002   with a st.jude mechanical valve   PROSTATE BIOPSY         Family History  Problem Relation Age of Onset   Heart failure Mother    Hypertension Mother    Lung cancer Sister     Social History   Tobacco Use   Smoking status: Former    Packs/day: 0.80    Years: 18.00    Pack years: 14.40    Types: Cigarettes    Quit date: 08/05/1979    Years since quitting: 41.5   Smokeless tobacco: Never  Vaping Use   Vaping Use: Never used  Substance Use Topics   Alcohol use: Yes  Comment: occasional glass of wine   Drug use: No    Home Medications Prior to Admission medications   Medication Sig Start Date End Date Taking? Authorizing Provider  acetaminophen (TYLENOL) 325 MG tablet Take 650 mg by mouth every 6 (six) hours as needed for moderate pain or headache.    [provider]  amoxicillin-clavulanate (AUGMENTIN) 875-125 MG tablet Take 1 tablet by mouth every 12 (twelve) hours. 01/25/21   Charlann Lange, PA-C  diclofenac Sodium (VOLTAREN) 1 % GEL Apply 1 application topically 4 (four) times daily as needed (pain).    [provider]  fluticasone (FLONASE) 50 MCG/ACT nasal spray Place 1-2 sprays into both nostrils daily as needed for  allergies.  01/26/15   [provider]  LORazepam (ATIVAN) 0.5 MG tablet Take 0.5 mg by mouth every 6 (six) hours as needed for anxiety.    [provider]  propranolol (INDERAL) 10 MG tablet Take 10 mg by mouth 3 (three) times daily.    [provider]  rivastigmine (EXELON) 1.5 MG capsule Take 1 capsule (1.5 mg total) by mouth 2 (two) times daily. 11/13/20   Cameron Sprang, MD  sertraline (ZOLOFT) 50 MG tablet Take 3 tablets by mouth daily. 12/03/20   [provider]  tamsulosin (FLOMAX) 0.4 MG CAPS capsule Take 0.4 mg by mouth at bedtime.    [provider]  warfarin (COUMADIN) 3 MG tablet TAKE 1/2 - 1 TABLET DAILY OR AS DIRECTED BY COUMADIN CLINIC Patient taking differently: Take 1.5-3 mg by mouth See admin instructions. Take 3 mg in the evening once daily except on Friday evening take 1.5 mg 09/21/17   Martinique, Peter M, MD    Allergies    Donepezil hcl and Memantine hcl  Review of Systems   Review of Systems  Constitutional:  Negative for appetite change and fever.  Cardiovascular:  Negative for chest pain.  Musculoskeletal:        Right forearm pain.  Skin:  Positive for wound.  Hematological:  Bruises/bleeds easily.   Physical Exam Updated Vital Signs BP (!) 154/71 (BP Location: Right Arm)   Pulse (!) 57   Temp 98.8 F (37.1 C) (Oral)   Resp 20   Ht 5' 7.5" (1.715 m)   Wt 70.3 kg   SpO2 99%   BMI 23.92 kg/m   Physical Exam Vitals reviewed.  Constitutional:      Appearance: Normal appearance.  HENT:     Head: Normocephalic.  Eyes:     Pupils: Pupils are equal, round, and reactive to light.  Cardiovascular:     Rate and Rhythm: Normal rate.  Abdominal:     Tenderness: There is no abdominal tenderness.  Musculoskeletal:     Cervical back: Neck supple.     Comments: Some erythema with edema on medial right forearm.  Tracking up arm from lacerations.  Neurovascular intact in hand.  Also dressed wounds on left forearm without  active bleeding.  Skin:    Capillary Refill: Capillary refill takes less than 2 seconds.  Neurological:     General: No focal deficit present.     Mental Status: He is alert.     ED Results / Procedures / Treatments   Labs (all labs ordered are listed, but only abnormal results are displayed) Labs Reviewed  CBC WITH DIFFERENTIAL/PLATELET - Abnormal; Notable for the following components:      Result Value   Hemoglobin 12.3 (*)    HCT 37.5 (*)  All other components within normal limits  PROTIME-INR - Abnormal; Notable for the following components:   Prothrombin Time 28.9 (*)    INR 2.7 (*)    All other components within normal limits    EKG None  Radiology DG Forearm Right  Result Date: 01/25/2021 CLINICAL DATA:  Dog bite EXAM: RIGHT FOREARM - 2 VIEW COMPARISON:  None. FINDINGS: No fracture or malalignment. No radiopaque foreign body in the soft tissues. Vascular calcification IMPRESSION: No acute osseous abnormality Electronically Signed   By: Donavan Foil M.D.   On: 01/25/2021 23:52    Procedures Procedures   Medications Ordered in ED Medications  Ampicillin-Sulbactam (UNASYN) 3 g in sodium chloride 0.9 % 100 mL IVPB (0 g Intravenous Stopped 01/26/21 2133)  oxyCODONE-acetaminophen (PERCOCET/ROXICET) 5-325 MG per tablet 1 tablet (1 tablet Oral Given 01/26/21 2045)    ED Course  I have reviewed the triage vital signs and the nursing notes.  Pertinent labs & imaging results that were available during my care of the patient were reviewed by me and considered in my medical decision making (see chart for details).    MDM Rules/Calculators/A&P                          Patient with edema of left forearm with erythema after dog bite.  On Augmentin but has had 1 dose orally last night and another dose today.  Lab work reassuring.  Potentially this is just edema from the bite but with the erythema I think it is worth an extra dose of IV antibiotics.  I do not think we have  failed outpatient management at this point.  Will discharge home with outpatient follow-up in a day or 2 to make sure is still improving.  INR therapeutic Final Clinical Impression(s) / ED Diagnoses Final diagnoses:  Long term (current) use of anticoagulants  Cellulitis of right upper extremity  Dog bite, initial encounter    Rx / DC Orders ED Discharge Orders     None        Davonna Belling, MD 01/26/21 2242

## 2021-01-26 NOTE — Discharge Instructions (Addendum)
Follow-up in another couple days for recheck of the redness and swelling.  Continue the antibiotics.  If you develop fevers or the redness worsens return to the ER.

## 2021-01-26 NOTE — ED Notes (Signed)
Patient verbalizes understanding of discharge instructions. Prescriptions and follow-up care reviewed. Opportunity for questioning and answers were provided. Armband removed by staff, pt discharged from ED ambulatory.  

## 2021-02-01 ENCOUNTER — Ambulatory Visit: Payer: Medicare Other | Admitting: Neurology

## 2021-04-01 ENCOUNTER — Other Ambulatory Visit: Payer: Self-pay | Admitting: Nurse Practitioner

## 2021-04-01 DIAGNOSIS — R519 Headache, unspecified: Secondary | ICD-10-CM

## 2021-04-01 DIAGNOSIS — R42 Dizziness and giddiness: Secondary | ICD-10-CM

## 2021-04-01 DIAGNOSIS — H539 Unspecified visual disturbance: Secondary | ICD-10-CM

## 2021-04-21 ENCOUNTER — Ambulatory Visit
Admission: RE | Admit: 2021-04-21 | Discharge: 2021-04-21 | Disposition: A | Discharge status: Home / Self Care | Payer: Medicare Other | Source: Ambulatory Visit | Attending: Nurse Practitioner | Admitting: Nurse Practitioner

## 2021-04-21 DIAGNOSIS — R519 Headache, unspecified: Secondary | ICD-10-CM

## 2021-04-21 DIAGNOSIS — H539 Unspecified visual disturbance: Secondary | ICD-10-CM

## 2021-04-21 DIAGNOSIS — R42 Dizziness and giddiness: Secondary | ICD-10-CM

## 2021-05-15 ENCOUNTER — Ambulatory Visit: Payer: Medicare Other | Admitting: Physician Assistant

## 2021-05-15 ENCOUNTER — Other Ambulatory Visit: Payer: Self-pay | Admitting: Orthopedic Surgery

## 2021-05-15 ENCOUNTER — Telehealth: Payer: Self-pay | Admitting: *Deleted

## 2021-05-15 NOTE — Telephone Encounter (Signed)
   [facility] Pre-operative Risk Assessment    Patient Name: MEREL SANTOLI  DOB: October 19, 1940 MRN: [id number]  Request for surgical clearance:  What type of surgery is being performed? Release A-1 pulley left ring finger  When is this surgery scheduled? 05/24/21  What type of clearance is required (medical clearance vs. Pharmacy clearance to hold med vs. Both)? both  Are there any medications that need to be held prior to surgery and how long? coumadin  Practice name and name of physician performing surgery? [facility] Dr. Fredna Dow  What is the office phone number? [phone]   7.   What is the office fax number? [phone] attn: Brenda  8.   Anesthesia type (None, local, MAC, general) ? Beir block   Shekina Cordell A Pietrina Jagodzinski 05/15/2021, 3:42 PM  _________________________________________________________________

## 2021-05-15 NOTE — Telephone Encounter (Signed)
Clinical pharmacist to review Coumadin.  Patient has a history of mechanical heart valve.

## 2021-05-16 ENCOUNTER — Other Ambulatory Visit: Payer: Self-pay

## 2021-05-16 ENCOUNTER — Encounter (HOSPITAL_BASED_OUTPATIENT_CLINIC_OR_DEPARTMENT_OTHER): Payer: Self-pay | Admitting: Orthopedic Surgery

## 2021-05-16 NOTE — Telephone Encounter (Signed)
Patient with diagnosis of mechanical MVR and afib on warfarin for anticoagulation.    Procedure: Release A-1 pulley left ring finger Date of procedure: 05/24/21  CrCl 108mL/min Platelet count 192K  Per office protocol, patient can hold warfarin for 5 days prior to procedure. Patient will need bridging with Lovenox (enoxaparin) around procedure. Warfarin is managed by [facility] who should coordinate pt's bridge.

## 2021-05-16 NOTE — Telephone Encounter (Signed)
   Primary Cardiologist: Peter Martinique, MD  Chart reviewed as part of pre-operative protocol coverage. Given past medical history and time since last visit, based on ACC/AHA guidelines, STEEN BISIG would be at acceptable risk for the planned procedure without further cardiovascular testing.   Patient with diagnosis of mechanical MVR and afib on warfarin for anticoagulation.     Procedure: Release A-1 pulley left ring finger Date of procedure: 05/24/21   CrCl 8mL/min Platelet count 192K   Per office protocol, patient can hold warfarin for 5 days prior to procedure. Patient will need bridging with Lovenox (enoxaparin) around procedure. Warfarin is managed by [place] who should coordinate pt's bridge.   Patient was advised that if he develops new symptoms prior to surgery to contact our office to arrange a follow-up appointment.  He verbalized understanding.  I will route this recommendation to the requesting party via Epic fax function and remove from pre-op pool.  Please call with questions.  Jossie Ng. Jozalyn Baglio NP-C    05/16/2021, 10:43 AM [place] [address] Office [phone] Fax [phone]

## 2021-05-17 ENCOUNTER — Ambulatory Visit: Payer: Medicare Other | Admitting: Physician Assistant

## 2021-05-21 ENCOUNTER — Other Ambulatory Visit: Payer: Self-pay | Admitting: Orthopedic Surgery

## 2021-05-24 ENCOUNTER — Ambulatory Visit (HOSPITAL_BASED_OUTPATIENT_CLINIC_OR_DEPARTMENT_OTHER): Admission: RE | Admit: 2021-05-24 | Payer: Medicare Other | Source: Home / Self Care | Admitting: Orthopedic Surgery

## 2021-05-24 SURGERY — RELEASE, A1 PULLEY, FOR TRIGGER FINGER
Anesthesia: Regional | Laterality: Left

## 2021-05-27 ENCOUNTER — Other Ambulatory Visit: Payer: Self-pay

## 2021-06-03 ENCOUNTER — Encounter (HOSPITAL_BASED_OUTPATIENT_CLINIC_OR_DEPARTMENT_OTHER)
Admission: RE | Admit: 2021-06-03 | Discharge: 2021-06-03 | Disposition: A | Discharge status: Home / Self Care | Payer: Medicare Other | Source: Ambulatory Visit | Attending: Orthopedic Surgery | Admitting: Orthopedic Surgery

## 2021-06-03 DIAGNOSIS — Z888 Allergy status to other drugs, medicaments and biological substances status: Secondary | ICD-10-CM | POA: Diagnosis not present

## 2021-06-03 DIAGNOSIS — I10 Essential (primary) hypertension: Secondary | ICD-10-CM | POA: Diagnosis not present

## 2021-06-03 DIAGNOSIS — Z87891 Personal history of nicotine dependence: Secondary | ICD-10-CM | POA: Diagnosis not present

## 2021-06-03 DIAGNOSIS — I441 Atrioventricular block, second degree: Secondary | ICD-10-CM | POA: Diagnosis not present

## 2021-06-03 DIAGNOSIS — M65842 Other synovitis and tenosynovitis, left hand: Secondary | ICD-10-CM | POA: Diagnosis present

## 2021-06-03 DIAGNOSIS — I4891 Unspecified atrial fibrillation: Secondary | ICD-10-CM | POA: Diagnosis not present

## 2021-06-03 LAB — PROTIME-INR
INR: 1.1 (ref 0.8–1.2)
Prothrombin Time: 14.2 seconds (ref 11.4–15.2)

## 2021-06-03 NOTE — Progress Notes (Signed)

## 2021-06-04 ENCOUNTER — Ambulatory Visit (HOSPITAL_BASED_OUTPATIENT_CLINIC_OR_DEPARTMENT_OTHER): Payer: Medicare Other | Admitting: Certified Registered"

## 2021-06-04 ENCOUNTER — Other Ambulatory Visit: Payer: Self-pay

## 2021-06-04 ENCOUNTER — Encounter (HOSPITAL_BASED_OUTPATIENT_CLINIC_OR_DEPARTMENT_OTHER)
Admission: RE | Disposition: A | Discharge status: Home / Self Care | Payer: Self-pay | Source: Home / Self Care | Attending: Orthopedic Surgery

## 2021-06-04 ENCOUNTER — Encounter (HOSPITAL_BASED_OUTPATIENT_CLINIC_OR_DEPARTMENT_OTHER): Payer: Self-pay | Admitting: Orthopedic Surgery

## 2021-06-04 ENCOUNTER — Ambulatory Visit (HOSPITAL_BASED_OUTPATIENT_CLINIC_OR_DEPARTMENT_OTHER)
Admission: RE | Admit: 2021-06-04 | Discharge: 2021-06-04 | Disposition: A | Discharge status: Home / Self Care | Payer: Medicare Other | Attending: Orthopedic Surgery | Admitting: Orthopedic Surgery

## 2021-06-04 DIAGNOSIS — Z7901 Long term (current) use of anticoagulants: Secondary | ICD-10-CM

## 2021-06-04 DIAGNOSIS — I4891 Unspecified atrial fibrillation: Secondary | ICD-10-CM | POA: Diagnosis not present

## 2021-06-04 DIAGNOSIS — I10 Essential (primary) hypertension: Secondary | ICD-10-CM | POA: Diagnosis not present

## 2021-06-04 DIAGNOSIS — M65842 Other synovitis and tenosynovitis, left hand: Secondary | ICD-10-CM | POA: Insufficient documentation

## 2021-06-04 DIAGNOSIS — Z87891 Personal history of nicotine dependence: Secondary | ICD-10-CM | POA: Insufficient documentation

## 2021-06-04 DIAGNOSIS — I441 Atrioventricular block, second degree: Secondary | ICD-10-CM | POA: Insufficient documentation

## 2021-06-04 DIAGNOSIS — Z888 Allergy status to other drugs, medicaments and biological substances status: Secondary | ICD-10-CM | POA: Insufficient documentation

## 2021-06-04 HISTORY — PX: TRIGGER FINGER RELEASE: SHX641

## 2021-06-04 SURGERY — RELEASE, A1 PULLEY, FOR TRIGGER FINGER
Anesthesia: Regional | Site: Hand | Laterality: Left

## 2021-06-04 MED ORDER — FENTANYL CITRATE (PF) 100 MCG/2ML IJ SOLN
INTRAMUSCULAR | Status: DC | PRN
  Administered 2021-06-04 (×2): 50 ug via INTRAVENOUS

## 2021-06-04 MED ORDER — PROPOFOL 10 MG/ML IV BOLUS
INTRAVENOUS | Status: DC | PRN
  Administered 2021-06-04: 20 mg via INTRAVENOUS

## 2021-06-04 MED ORDER — LIDOCAINE HCL (PF) 1 % IJ SOLN
INTRAMUSCULAR | Status: AC
  Filled 2021-06-04: qty 30

## 2021-06-04 MED ORDER — BUPIVACAINE HCL (PF) 0.25 % IJ SOLN
INTRAMUSCULAR | Status: DC | PRN
  Administered 2021-06-04: 8 mL

## 2021-06-04 MED ORDER — FENTANYL CITRATE (PF) 100 MCG/2ML IJ SOLN
INTRAMUSCULAR | Status: AC
  Filled 2021-06-04: qty 2

## 2021-06-04 MED ORDER — ONDANSETRON HCL 4 MG/2ML IJ SOLN
INTRAMUSCULAR | Status: DC | PRN
  Administered 2021-06-04: 4 mg via INTRAVENOUS

## 2021-06-04 MED ORDER — CEFAZOLIN SODIUM-DEXTROSE 2-4 GM/100ML-% IV SOLN
INTRAVENOUS | Status: AC
  Filled 2021-06-04: qty 100

## 2021-06-04 MED ORDER — BUPIVACAINE HCL (PF) 0.25 % IJ SOLN
INTRAMUSCULAR | Status: AC
  Filled 2021-06-04: qty 30

## 2021-06-04 MED ORDER — LACTATED RINGERS IV SOLN: INTRAVENOUS | Status: DC

## 2021-06-04 MED ORDER — TRAMADOL HCL 50 MG PO TABS: 50.0000 mg | ORAL_TABLET | Freq: Four times a day (QID) | ORAL | 0 refills | Status: DC | PRN

## 2021-06-04 MED ORDER — OXYCODONE HCL 5 MG PO TABS: 5.0000 mg | ORAL_TABLET | Freq: Once | ORAL | Status: DC | PRN

## 2021-06-04 MED ORDER — CEFAZOLIN SODIUM-DEXTROSE 2-4 GM/100ML-% IV SOLN
2.0000 g | INTRAVENOUS | Status: AC
  Administered 2021-06-04: 2 g via INTRAVENOUS

## 2021-06-04 MED ORDER — LIDOCAINE HCL (PF) 0.5 % IJ SOLN
INTRAMUSCULAR | Status: DC | PRN
  Administered 2021-06-04: 30 mL via INTRAVENOUS

## 2021-06-04 MED ORDER — PROMETHAZINE HCL 25 MG/ML IJ SOLN: 6.2500 mg | INTRAMUSCULAR | Status: DC | PRN

## 2021-06-04 MED ORDER — 0.9 % SODIUM CHLORIDE (POUR BTL) OPTIME
TOPICAL | Status: DC | PRN
  Administered 2021-06-04: 120 mL

## 2021-06-04 MED ORDER — OXYCODONE HCL 5 MG/5ML PO SOLN: 5.0000 mg | Freq: Once | ORAL | Status: DC | PRN

## 2021-06-04 MED ORDER — HYDROMORPHONE HCL 1 MG/ML IJ SOLN: 0.2500 mg | INTRAMUSCULAR | Status: DC | PRN

## 2021-06-04 SURGICAL SUPPLY — 37 items
APL PRP STRL LF DISP 70% ISPRP (MISCELLANEOUS) ×1
BLADE SURG 15 STRL LF DISP TIS (BLADE) ×1 IMPLANT
BLADE SURG 15 STRL SS (BLADE) ×2
BNDG CMPR 5X2 CHSV 1 LYR STRL (GAUZE/BANDAGES/DRESSINGS) ×1
BNDG CMPR 9X4 STRL LF SNTH (GAUZE/BANDAGES/DRESSINGS) ×1
BNDG COHESIVE 2X5 TAN ST LF (GAUZE/BANDAGES/DRESSINGS) ×2 IMPLANT
BNDG ESMARK 4X9 LF (GAUZE/BANDAGES/DRESSINGS) ×1 IMPLANT
CHLORAPREP W/TINT 26 (MISCELLANEOUS) ×2 IMPLANT
CORD BIPOLAR FORCEPS 12FT (ELECTRODE) IMPLANT
COVER BACK TABLE 60X90IN (DRAPES) ×2 IMPLANT
COVER MAYO STAND STRL (DRAPES) ×2 IMPLANT
CUFF TOURN SGL QUICK 18X4 (TOURNIQUET CUFF) IMPLANT
DECANTER SPIKE VIAL GLASS SM (MISCELLANEOUS) IMPLANT
DRAPE EXTREMITY T 121X128X90 (DISPOSABLE) ×2 IMPLANT
DRAPE U-SHAPE 47X51 STRL (DRAPES) ×1 IMPLANT
GAUZE 4X4 16PLY ~~LOC~~+RFID DBL (SPONGE) ×1 IMPLANT
GAUZE SPONGE 4X4 12PLY STRL (GAUZE/BANDAGES/DRESSINGS) ×2 IMPLANT
GAUZE XEROFORM 1X8 LF (GAUZE/BANDAGES/DRESSINGS) ×2 IMPLANT
GLOVE SURG ORTHO LTX SZ8 (GLOVE) ×2 IMPLANT
GLOVE SURG POLYISO LF SZ6.5 (GLOVE) ×1 IMPLANT
GLOVE SURG UNDER POLY LF SZ6.5 (GLOVE) ×1 IMPLANT
GLOVE SURG UNDER POLY LF SZ7 (GLOVE) ×1 IMPLANT
GLOVE SURG UNDER POLY LF SZ8.5 (GLOVE) ×2 IMPLANT
GOWN STRL REUS W/ TWL LRG LVL3 (GOWN DISPOSABLE) ×1 IMPLANT
GOWN STRL REUS W/TWL LRG LVL3 (GOWN DISPOSABLE) ×2
GOWN STRL REUS W/TWL XL LVL3 (GOWN DISPOSABLE) ×2 IMPLANT
NDL HYPO 27GX1-1/4 (NEEDLE) ×1 IMPLANT
NEEDLE HYPO 27GX1-1/4 (NEEDLE) ×2 IMPLANT
NS IRRIG 1000ML POUR BTL (IV SOLUTION) ×2 IMPLANT
PACK BASIN DAY SURGERY FS (CUSTOM PROCEDURE TRAY) ×2 IMPLANT
SLEEVE SCD COMPRESS KNEE MED (STOCKING) ×1 IMPLANT
STOCKINETTE 4X48 STRL (DRAPES) ×2 IMPLANT
SUT ETHILON 4 0 PS 2 18 (SUTURE) ×2 IMPLANT
SYR BULB EAR ULCER 3OZ GRN STR (SYRINGE) ×2 IMPLANT
SYR CONTROL 10ML LL (SYRINGE) ×2 IMPLANT
TOWEL GREEN STERILE FF (TOWEL DISPOSABLE) ×4 IMPLANT
UNDERPAD 30X36 HEAVY ABSORB (UNDERPADS AND DIAPERS) ×2 IMPLANT

## 2021-06-04 NOTE — Transfer of Care (Signed)
Immediate Anesthesia Transfer of Care Note  Patient: Nathan Mcdaniel  Procedure(s) Performed: RELEASE TRIGGER FINGER/A-1 PULLEY LEFT RING FINGER (Left: Hand)  Patient Location: PACU  Anesthesia Type:MAC and Bier block  Level of Consciousness: awake, alert  and oriented  Airway & Oxygen Therapy: Patient Spontanous Breathing  Post-op Assessment: Report given to RN and Post -op Vital signs reviewed and stable  Post vital signs: Reviewed and stable  Last Vitals:  Vitals Value Taken Time  BP 143/69 06/04/21 0912  Temp    Pulse 50 06/04/21 0914  Resp 5 06/04/21 0914  SpO2 100 % 06/04/21 0914  Vitals shown include unvalidated device data.  Last Pain:  Vitals:   06/04/21 0719  TempSrc: Oral  PainSc: 0-No pain      Patients Stated Pain Goal: 4 (91/69/45 0388)  Complications: No notable events documented.

## 2021-06-04 NOTE — Op Note (Signed)
NAME: Nathan Mcdaniel MEDICAL RECORD NO: [id number] DATE OF BIRTH: 1940/09/17 FACILITY: [facility] LOCATION: [facility] PHYSICIAN: Wynonia Sours, MD   OPERATIVE REPORT   DATE OF PROCEDURE: 06/04/21    PREOPERATIVE DIAGNOSIS: Stenosing tenosynovitis left ring finger   POSTOPERATIVE DIAGNOSIS: Same   PROCEDURE: Release A1 pulley left ring finger   SURGEON: Daryll Brod, M.D.   ASSISTANT: none   ANESTHESIA: Bier block with sedation and local   INTRAVENOUS FLUIDS:  Per anesthesia flow sheet.   ESTIMATED BLOOD LOSS:  Minimal.   COMPLICATIONS:  None.   SPECIMENS:  none   TOURNIQUET TIME:    Total Tourniquet Time Documented: Forearm (Left) - 21 minutes Total: Forearm (Left) - 21 minutes    DISPOSITION:  Stable to PACU.   INDICATIONS: Patient is an 80 year old male with a history of triggering of left ring finger.  This is not responded to conservative treatment including multiple injections.  He is elected undergo release of the A1 pulley left ring finger.  Preperi-and postoperative course been discussed along with risk and complications.  He is aware that there is no guarantee to the surgery the possibility of infection recurrence injury to arteries nerves tendons incomplete relief symptoms and dystrophy.  In the preoperative area the patient is seen the extremity marked by both patient and surgeon antibiotic given  OPERATIVE COURSE: Patient is brought the operating room placed in a supine position with the left arm free.  A forearm IV regional anesthetic was carried out without difficulty under the direction the anesthesia department.  He was prepped with ChloraPrep.  3-minute dry time was allowed timeout taken confirm patient procedure.  An oblique incision was made over the A1 pulley of the left ring finger carried down through subcutaneous tissue.  Retractors were placed retracting neurovascular bundles radially and ulnarly.  The flexor retinaculum was then released  on its radial aspect.  A small incision was made centrally and A2.  A small flexor sheath cyst was present this was removed.  Tenosynovial tissue proximally was incised.  The 2 tendons were then separated with retractors were removing any adhesions between the 2 tendons.  The finger was placed through full passive range of motion no further triggering was noted.  The wound was copious irrigated with saline.  It was closed with interrupted 4 nylon sutures.  Local infiltration quarter percent bupivacaine without epinephrine was given 8 cc was used.  Patient was awake and was able to fully flex extend his finger with no further triggering noted.  Sterile compressive dressing was applied.  Deflation of the tourniquet all fingers immediately pink.  He was taken to the recovery room for observation in satisfactory condition.  He will be discharged home return to the hand center [facility] in 1 week and Tylenol ibuprofen for pain with Ultram for breakthrough.   Daryll Brod, MD Electronically signed, 06/04/21

## 2021-06-04 NOTE — H&P (Signed)
Nathan Mcdaniel is an 80 y.o. male.   Chief Complaint: catching left ring finger HPI: Nathan Mcdaniel is n 80 yo male with triggering of his left ring finger. This has been injected 3 times without relief.He has no prior history of injury. He states nothing makes it better or worse. He has no history of diabetes thyroid problems or arthritis or gout. Raquel Sarna history is negative for these also he is not complaining of any numbness or tingling. Does not use his other hand to straighten them out.  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   Mitral insufficiency    Mobitz type 1 second degree atrioventricular block    Palpitations    Pseudoaneurysm    of forehead   Pure hypercholesterolemia 10/07/2010   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   Stroke 06/09/2018   Brain MRI revealed two remote tiny infarcts in the left cerebellum   Vitamin D deficiency 10/07/2010    Past Surgical History:  Procedure Laterality Date   CATARACT EXTRACTION, BILATERAL     CHOLECYSTECTOMY     COLON SURGERY     with diversion and resection   FALSE ANEURYSM REPAIR N/A 09/23/2019   Procedure: REPAIR FALSE ANEURYSM FOREHEAD;  Surgeon: Elam Dutch, MD;  Location: [facility];  Service: Vascular;  Laterality: N/A;   MITRAL VALVE REPLACEMENT  2002   with a st.jude mechanical valve   PROSTATE BIOPSY      Family History  Problem Relation Age of Onset   Heart failure Mother    Hypertension Mother    Lung cancer Sister     Social History:  reports that he quit smoking about 41 years ago. His smoking use included cigarettes. He has a 14.40 pack-year smoking history. He has never used smokeless tobacco. He reports current alcohol use. He reports that he does not use drugs.  Allergies:  Allergies  Allergen Reactions   Donepezil Hcl Other (See Comments)    Hallucinations    Memantine Hcl Other (See Comments)    Hallucinations     No medications prior to admission.    Results for orders placed or performed during the hospital encounter of 06/04/21 (from the past 48 hour(s))  Protime-INR     Status: None   Collection Time: 06/03/21  1:00 PM  Result Value Ref Range   Prothrombin Time 14.2 11.4 - 15.2 seconds   INR 1.1 0.8 - 1.2    Comment: (NOTE) INR goal varies based on device and disease states. Performed at [facility], [place] [address], [place], [place] [address]     No results found.   Pertinent items are noted in HPI.  There were no vitals taken for this visit.  General appearance: alert, cooperative, and appears stated age Head: Normocephalic, without obvious abnormality, asymmetric shape, atraumatic Neck: no JVD Resp: clear to auscultation bilaterally Cardio: regular rate and rhythm, S1, S2 normal, no murmur, click, rub or gallop GI: soft, non-tender; bowel sounds normal; no masses,  no organomegaly Extremities:  catching left ring finger Pulses: 2+ and symmetric Skin: Skin color, texture, turgor normal. No rashes or lesions Neurologic: Grossly normal Incision/Wound: na  Assessment/Plan Diagnose stenosing tenosynovitis left ring. Plan: release A-1 pulley ring finger.  He is aware that there is no guarantee to the surgery the possibility of infection recurrence injury to arteries nerves tendons complete relief symptoms and dystrophy. Daryll Brod 06/04/2021, 4:33 AM

## 2021-06-04 NOTE — Brief Op Note (Signed)
06/04/2021  9:14 AM  PATIENT:  Nathan Mcdaniel  80 y.o. male  PRE-OPERATIVE DIAGNOSIS:  LEFT RING FINGER TRIGGER  POST-OPERATIVE DIAGNOSIS:  LEFT RING FINGER TRIGGER  PROCEDURE:  Procedure(s): RELEASE TRIGGER FINGER/A-1 PULLEY LEFT RING FINGER (Left)  SURGEON:  Surgeon(s) and Role:    * Daryll Brod, MD - Primary  PHYSICIAN ASSISTANT:   ASSISTANTS: none   ANESTHESIA:   local, regional, and IV sedation  EBL: 1 mL  BLOOD ADMINISTERED:none  DRAINS: none   LOCAL MEDICATIONS USED:  MARCAINE     SPECIMEN:  No Specimen  DISPOSITION OF SPECIMEN:  N/A  COUNTS:  YES  TOURNIQUET:   Total Tourniquet Time Documented: Forearm (Left) - 21 minutes Total: Forearm (Left) - 21 minutes   DICTATION: .Viviann Spare Dictation  PLAN OF CARE: Discharge to home after PACU  PATIENT DISPOSITION:  PACU - hemodynamically stable.

## 2021-06-04 NOTE — Anesthesia Preprocedure Evaluation (Signed)
Anesthesia Evaluation  Patient identified by MRN, date of birth, ID band Patient awake    Reviewed: Allergy & Precautions, NPO status , Patient's Chart, lab work & pertinent test results  Airway Mallampati: II  TM Distance: >3 FB Neck ROM: Full    Dental no notable dental hx.    Pulmonary former smoker,    Pulmonary exam normal breath sounds clear to auscultation       Cardiovascular hypertension, Pt. on medications and Pt. on home beta blockers Normal cardiovascular exam+ dysrhythmias Atrial Fibrillation  Rhythm:Regular Rate:Normal  S/p mechanical mitral valve. Normal EF on 2015 echo.   Neuro/Psych  Headaches, Anxiety Depression CVA    GI/Hepatic negative GI ROS, Neg liver ROS,   Endo/Other  negative endocrine ROS  Renal/GU negative Renal ROS     Musculoskeletal   Abdominal   Peds  Hematology negative hematology ROS (+)   Anesthesia Other Findings   Reproductive/Obstetrics                             Lab Results  Component Value Date   WBC 7.9 01/26/2021   HGB 12.3 (L) 01/26/2021   HCT 37.5 (L) 01/26/2021   MCV 88.9 01/26/2021   PLT 204 01/26/2021   Lab Results  Component Value Date   CREATININE 1.04 01/04/2020   BUN 15 01/04/2020   NA 138 01/04/2020   K 4.5 01/04/2020   CL 100 01/04/2020   CO2 31 01/04/2020    Anesthesia Physical  Anesthesia Plan  ASA: III  Anesthesia Plan: Bier Block and Bier Block-LIDOCAINE ONLY   Post-op Pain Management:    Induction: Intravenous  PONV Risk Score and Plan: 1 and Ondansetron and Treatment may vary due to age or medical condition  Airway Management Planned: Simple Face Mask  Additional Equipment:   Intra-op Plan:   Post-operative Plan:   Informed Consent: I have reviewed the patients History and Physical, chart, labs and discussed the procedure including the risks, benefits and alternatives for the proposed anesthesia with  the patient or authorized representative who has indicated his/her understanding and acceptance.       Plan Discussed with: CRNA and Surgeon  Anesthesia Plan Comments: (  EKG 07/15/18 (Read per Dr. Doug Sou OV note): NSR with first degree AV block, sinus arrhythmia. Diffuse T wave inversion.  Nuclear stress 08/31/2013: Overall Impression:  Normal stress nuclear study.  LV Ejection Fraction: 49%.  LV Wall Motion:  Paradoxican septal motion, otherwise no regional wall motion abnormalities, low normal LVEF.  TTE 08/23/13: - Left ventricle: The cavity size was mildly dilated. Wall  thickness was increased in a pattern of moderate LVH.  Systolic function was normal. The estimated ejection  fraction was in the range of 50% to 55%.  - Aortic valve: Trivial regurgitation.  - Mitral valve: Normal appearing mechanical MVR with no peri  valvular leak  - Left atrium: The atrium was moderately to severely  dilated.  - Right atrium: The atrium was mildly dilated.  - Atrial septum: No defect or patent foramen ovale was  identified. )        Anesthesia Quick Evaluation

## 2021-06-04 NOTE — Anesthesia Postprocedure Evaluation (Signed)
Anesthesia Post Note  Patient: Nathan Mcdaniel  Procedure(s) Performed: RELEASE TRIGGER FINGER/A-1 PULLEY LEFT RING FINGER (Left: Hand)     Patient location during evaluation: PACU Anesthesia Type: Bier Block Level of consciousness: awake and alert Pain management: pain level controlled Vital Signs Assessment: post-procedure vital signs reviewed and stable Respiratory status: spontaneous breathing, nonlabored ventilation and respiratory function stable Cardiovascular status: blood pressure returned to baseline and stable Postop Assessment: no apparent nausea or vomiting Anesthetic complications: no   No notable events documented.  Last Vitals:  Vitals:   06/04/21 0930 06/04/21 0958  BP: 130/63 135/67  Pulse: (!) 45   Resp: 10 12  Temp:  36.6 C  SpO2: 94% 96%    Last Pain:  Vitals:   06/04/21 0958  TempSrc:   PainSc: 0-No pain                 Lynda Rainwater

## 2021-06-04 NOTE — Anesthesia Procedure Notes (Signed)
Anesthesia Regional Block: Bier block (IV Regional)   Pre-Anesthetic Checklist: , timeout performed,  Correct Patient, Correct Site, Correct Laterality,  Correct Procedure,, site marked,  Surgical consent,  At surgeon's request  Laterality: Left         Needles:  Injection technique: Single-shot  Needle Type: Other      Needle Gauge: 20     Additional Needles:   Procedures:,,,,, intact distal pulses, Esmarch exsanguination,  Single tourniquet utilized    Narrative:  Start time: 06/04/2021 9:48 AM End time: 06/04/2021 9:49 AM Injection made incrementally with aspirations every 30 mL.  Performed by: Personally

## 2021-06-04 NOTE — Discharge Instructions (Addendum)

## 2021-06-05 ENCOUNTER — Encounter (HOSPITAL_BASED_OUTPATIENT_CLINIC_OR_DEPARTMENT_OTHER): Payer: Self-pay | Admitting: Orthopedic Surgery

## 2021-06-06 NOTE — Telephone Encounter (Signed)
Please advise 

## 2021-06-07 NOTE — Telephone Encounter (Signed)
I would recommend he stop taking Inderal.   Thanks  Gerrianne Aydelott Martinique MD, [facility]

## 2021-06-10 NOTE — Telephone Encounter (Signed)
Spoke to patient advised to stop taking Inderal.

## 2021-12-18 ENCOUNTER — Encounter: Payer: Self-pay | Admitting: Physician Assistant

## 2021-12-18 ENCOUNTER — Ambulatory Visit (INDEPENDENT_AMBULATORY_CARE_PROVIDER_SITE_OTHER): Payer: Medicare Other | Admitting: Physician Assistant

## 2021-12-18 VITALS — BP 126/64 | HR 57 | Ht 67.5 in | Wt 153.6 lb

## 2021-12-18 DIAGNOSIS — Z952 Presence of prosthetic heart valve: Secondary | ICD-10-CM | POA: Diagnosis not present

## 2021-12-18 DIAGNOSIS — Z9229 Personal history of other drug therapy: Secondary | ICD-10-CM | POA: Diagnosis not present

## 2021-12-18 DIAGNOSIS — R001 Bradycardia, unspecified: Secondary | ICD-10-CM | POA: Diagnosis not present

## 2021-12-18 DIAGNOSIS — I441 Atrioventricular block, second degree: Secondary | ICD-10-CM

## 2021-12-18 NOTE — Progress Notes (Signed)
error 

## 2021-12-18 NOTE — Patient Instructions (Signed)
Medication Instructions:  No Changes *If you need a refill on your cardiac medications before your next appointment, please call your pharmacy*   Lab Work: No labs If you have labs (blood work) drawn today and your tests are completely normal, you will receive your results only by: MyChart Message (if you have MyChart) OR A paper copy in the mail If you have any lab test that is abnormal or we need to change your treatment, we will call you to review the results.   Testing/Procedures: No Testing   Follow-Up: At [facility], you and your health needs are our priority.  As part of our continuing mission to provide you with exceptional heart care, we have created designated Provider Care Teams.  These Care Teams include your primary Cardiologist (physician) and Advanced Practice Providers (APPs -  Physician Assistants and Nurse Practitioners) who all work together to provide you with the care you need, when you need it.  We recommend signing up for the patient portal called "MyChart".  Sign up information is provided on this After Visit Summary.  MyChart is used to connect with patients for Virtual Visits (Telemedicine).  Patients are able to view lab/test results, encounter notes, upcoming appointments, etc.  Non-urgent messages can be sent to your provider as well.   To learn more about what you can do with MyChart, go to [email or web address].    Your next appointment:   1 year(s)  The format for your next appointment:   In Person  Provider:   Peter Jordan, MD       Important Information About Sugar       

## 2021-12-18 NOTE — Progress Notes (Signed)
?Cardiology Office Note:   ? ?Date:  12/18/2021  ? ?ID:  Nathan Mcdaniel, DOB [birth year], MRN [id number] ? ?PCP:  Chesley Noon, MD ?  ?[facility] Providers ?Cardiologist:  Peter Martinique, MD ?Cardiology APP:  Ledora Bottcher, [place] { ?Referring MD: Chesley Noon, MD  ? ?Chief Complaint  ?Patient presents with  ? Follow-up  ?  Mechanical MV, bradycardia  ? ? ?History of Present Illness:   ? ?Nathan Mcdaniel is a 81 y.o. male with a hx of mechanical MVR in 2002, chronic coumadin therapy, and PACs.  Nuclear stress test and echo were unremarkable in 2015, completed for ST-T wave changes on EKG.  Also noted to have second-degree Mobitz 1 AV block and beta-blocker was reduced. PACs have been managed with metoprolol, was switch to Inderal for management of tremors.  He subsequently was taken off of Inderal in November 2022 by Dr. Martinique for bradycardia in the 40-50s.  He was seen in the ER June/2021 for chest discomfort and ruled out with negative cardiac enzymes and a nonischemic EKG.  Echocardiogram showed stable valve prosthesis, LVEF 55%, moderate LVH, and moderate dilated left atria. ? ?He presents today for routine follow-up. He is doing well without cardiac complaints. No syncope, no chest pain, dyspnea, or bleeding issues. He does have some perseverative speech during our interview.  ? ? ?Past Medical History:  ?Diagnosis Date  ? Abnormal ECG   ? Allergic rhinitis 10/24/2009  ? Asymptomatic varicose veins 10/07/2010  ? Atrial fibrillation 10/14/2011  ? Dr Neita Garnet follows and checks inr  ? 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  ? Elevated prostate specific antigen (PSA) 11/26/2010  ? Generalized anxiety disorder   ? Headache   ? 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 History of skin cancer   ? Hypertension   ? Major depressive disorder   ? Mild neurocognitive disorder, 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  ? Mitral insufficiency   ? Mobitz type 1 second degree atrioventricular  block   ? Palpitations   ? Pseudoaneurysm   ? of forehead  ? Pure hypercholesterolemia 10/07/2010  ? 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 Stroke 06/09/2018  ? Brain MRI revealed two remote tiny infarcts in the left cerebellum  ? Vitamin D deficiency 10/07/2010  ? ? ?Past Surgical History:  ?Procedure Laterality Date  ? CATARACT EXTRACTION, BILATERAL    ? CHOLECYSTECTOMY    ? COLON SURGERY    ? with diversion and resection  ? FALSE ANEURYSM REPAIR N/A 09/23/2019  ? Procedure: REPAIR FALSE ANEURYSM FOREHEAD;  Surgeon: Elam Dutch, MD;  Location: [facility];  Service: Vascular;  Laterality: N/A;  ? MITRAL VALVE REPLACEMENT  2002  ? with a st.jude mechanical valve  ? PROSTATE BIOPSY    ? TRIGGER FINGER RELEASE Left 06/04/2021  ? Procedure: RELEASE TRIGGER FINGER/A-1 PULLEY LEFT RING FINGER;  Surgeon: Daryll Brod, MD;  Location: [facility];  Service: Orthopedics;  Laterality: Left;  ? ? ?Current Medications: ?Current Meds  ?Medication Sig  ? escitalopram (LEXAPRO) 10 MG tablet Take 10 mg by mouth daily.  ? LORazepam (ATIVAN) 0.5 MG tablet Take 0.5 mg by mouth every 6 (six) hours as needed for anxiety.  ? tamsulosin (FLOMAX) 0.4 MG CAPS capsule Take 0.4 mg by mouth at bedtime.  ? warfarin (COUMADIN) 3 MG tablet TAKE 1/2 - 1 TABLET DAILY OR AS DIRECTED BY COUMADIN CLINIC (Patient taking differently: Take 1.5-3 mg by mouth See admin instructions. Take  3 mg in the evening once daily except on Friday evening take 1.5 mg)  ?  ? ?Allergies:   Donepezil hcl and Memantine hcl  ? ?Social History  ? ?Socioeconomic History  ? Marital status: Married  ?  Spouse name: Not on file  ? Number of children: 2  ? Years of education: 73  ? Highest education level: Master's degree (e.g., MA, MS, MEng, MEd, MSW, MBA)  ?Occupational History  ? Occupation: [employer]  ?  Employer: RETIRED  ?  Comment: retired Nature conservation officer  ?Tobacco Use  ? Smoking status: Former  ?  Packs/day: 0.80  ?  Years: 18.00  ?  Pack years: 14.40  ?   Types: Cigarettes  ?  Quit date: 08/05/1979  ?  Years since quitting: 42.4  ? Smokeless tobacco: Never  ?Vaping Use  ? Vaping Use: Never used  ?Substance and Sexual Activity  ? Alcohol use: Yes  ?  Comment: occasional glass of wine  ? Drug use: No  ? Sexual activity: Not on file  ?Other Topics Concern  ? Not on file  ?Social History Narrative  ? Right handed   ? Lives with wife   ? ?Social Determinants of Health  ? ?Financial Resource Strain: Not on file  ?Food Insecurity: Not on file  ?Transportation Needs: Not on file  ?Physical Activity: Not on file  ?Stress: Not on file  ?Social Connections: Not on file  ?  ? ?Family History: ?The patient's family history includes Heart failure in his mother; Hypertension in his mother; Lung cancer in his sister. ? ?ROS:   ?Please see the history of present illness.    ? All other systems reviewed and are negative. ? ?EKGs/Labs/Other Studies Reviewed:   ? ?The following studies were reviewed today: ? ?Echo 02/13/20: ? 1. Left ventricular ejection fraction, by estimation, is 50 to 55%. The  ?left ventricle has low normal function. The left ventricle demonstrates  ?global hypokinesis. There is moderate concentric left ventricular  ?hypertrophy. Left ventricular diastolic  ?function could not be evaluated.  ? 2. Right ventricular systolic function is normal. The right ventricular  ?size is normal. There is normal pulmonary artery systolic pressure.  ? 3. Left atrial size was moderately dilated.  ? 4. The mitral valve has been repaired/replaced. No evidence of mitral  ?valve regurgitation. No evidence of mitral stenosis. The mean mitral valve  ?gradient is 5.0 mmHg. There is a mechanical valve present in the mitral  ?position. Echo findings are  ?consistent with normal structure and function of the mitral valve  ?prosthesis.  ? 5. The aortic valve is normal in structure. Aortic valve regurgitation is  ?mild. No aortic stenosis is present.  ? 6. The inferior vena cava is normal in size  with greater than 50%  ?respiratory variability, suggesting right atrial pressure of 3 mmHg.  ? ?EKG:  EKG is  ordered today.  The ekg ordered today demonstrates sinus bradycardia HR 57, possible ectopic atrial bradycardia, PVC ? ?Recent Labs: ?01/26/2021: Hemoglobin 12.3; Platelets 204  ?Recent Lipid Panel ?No results found for: CHOL, TRIG, HDL, CHOLHDL, VLDL, LDLCALC, LDLDIRECT ? ? ?Risk Assessment/Calculations:   ?  ? ?    ? ?Physical Exam:   ? ?VS:  BP 126/64 (BP Location: Left Arm, Patient Position: Sitting, Cuff Size: Normal)   Pulse (!) 57   Ht 5' 7.5" (1.715 m)   Wt 153 lb 9.6 oz (69.7 kg)   SpO2 95%   BMI 23.70 kg/m?    ? ?  Wt Readings from Last 3 Encounters:  ?12/18/21 153 lb 9.6 oz (69.7 kg)  ?06/04/21 155 lb 13.8 oz (70.7 kg)  ?01/26/21 155 lb (70.3 kg)  ?  ? ?GEN:  Well nourished, well developed in no acute distress ?HEENT: Normal ?NECK: No JVD; No carotid bruits ?LYMPHATICS: No lymphadenopathy ?CARDIAC: RRR, valve click ?RESPIRATORY:  Clear to auscultation without rales, wheezing or rhonchi  ?ABDOMEN: Soft, non-tender, non-distended ?MUSCULOSKELETAL:  No edema; No deformity  ?SKIN: Warm and dry ?NEUROLOGIC:  Alert and oriented x 3 ?PSYCHIATRIC:  Normal affect  ? ?ASSESSMENT:   ? ?1. S/P MVR (mitral valve replacement)   ?2. History of Coumadin therapy   ?3. Mobitz type 1 second degree atrioventricular block   ?4. Sinus bradycardia   ? ?PLAN:   ? ?In order of problems listed above: ? 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 ? ?Chronic Coumadin therapy ?Followed by PCP ?No bleeding issues ? ? ?History of second-degree Mobitz type I AV block ?Not on inderal, avoid BB ?- no syncope ? ? ?Bradycardia ?- stopped inderal for HR int he 40-50s, per MyChart messages ?- no syncope ? ? ?Memory impairment ?- on no medications ? ? ?Follow up 1 year with Dr. Martinique.  ? ? ? ?Medication Adjustments/Labs and Tests Ordered: ?Current medicines are reviewed at  length with the patient today.  Concerns regarding medicines are outlined above.  ?Orders Placed This Encounter  ?Procedures  ? EKG 12-Lead  ? ?No orders of the defined types were placed in this encounter. ? ? ?Patien

## 2022-09-07 ENCOUNTER — Other Ambulatory Visit: Payer: Self-pay

## 2022-09-07 ENCOUNTER — Encounter (HOSPITAL_BASED_OUTPATIENT_CLINIC_OR_DEPARTMENT_OTHER): Payer: Self-pay | Admitting: *Deleted

## 2022-09-07 ENCOUNTER — Emergency Department (HOSPITAL_BASED_OUTPATIENT_CLINIC_OR_DEPARTMENT_OTHER)
Admission: EM | Admit: 2022-09-07 | Discharge: 2022-09-07 | Discharge status: Against Medical Advice | Payer: Medicare Other | Attending: Emergency Medicine | Admitting: Emergency Medicine

## 2022-09-07 DIAGNOSIS — R8290 Unspecified abnormal findings in urine: Secondary | ICD-10-CM | POA: Diagnosis not present

## 2022-09-07 DIAGNOSIS — Z5321 Procedure and treatment not carried out due to patient leaving prior to being seen by health care provider: Secondary | ICD-10-CM | POA: Diagnosis not present

## 2022-09-07 LAB — URINALYSIS, ROUTINE W REFLEX MICROSCOPIC
Bacteria, UA: NONE SEEN
Bilirubin Urine: NEGATIVE
Glucose, UA: NEGATIVE mg/dL
Ketones, ur: NEGATIVE mg/dL
Leukocytes,Ua: NEGATIVE
Nitrite: NEGATIVE
Protein, ur: NEGATIVE mg/dL
Specific Gravity, Urine: 1.007 (ref 1.005–1.030)
pH: 5.5 (ref 5.0–8.0)

## 2022-09-07 NOTE — ED Triage Notes (Addendum)
Here by POV from home with wife, here for green urine, onset today, denies change in medications or vitamins. No other associated sx. Denies fever, chills, NVD, or pain. No complaints other than urine color. Urine color in triage is bright light yellow.

## 2022-09-07 NOTE — ED Notes (Signed)
Registration in lobby reports pt turned in labels and left. Not seen in Obs 2.

## 2022-12-12 ENCOUNTER — Ambulatory Visit: Payer: Medicare Other | Admitting: Podiatry

## 2022-12-12 ENCOUNTER — Ambulatory Visit (INDEPENDENT_AMBULATORY_CARE_PROVIDER_SITE_OTHER): Payer: Medicare Other

## 2022-12-12 DIAGNOSIS — B351 Tinea unguium: Secondary | ICD-10-CM

## 2022-12-12 DIAGNOSIS — M778 Other enthesopathies, not elsewhere classified: Secondary | ICD-10-CM | POA: Diagnosis not present

## 2022-12-12 DIAGNOSIS — T148XXA Other injury of unspecified body region, initial encounter: Secondary | ICD-10-CM

## 2022-12-12 NOTE — Progress Notes (Signed)
Subjective:  Patient ID: Nathan Mcdaniel, male    DOB: 05-Sep-1940,  MRN: [id number]  Chief Complaint  Patient presents with   Nail Problem    Nail fungus   Foot Problem    Small place on arch that is red and sometimes itch. Patient denies any pain. Patient does not know how long it has been there.     82 y.o. male presents with concern for small placed on the arch of his left foot that has been present for unknown length of time.  It is a circular brownish-red color.  Does not have pain.  It does itch sometimes.  Denies any wound that he can recall.  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   History of skin cancer    Hypertension    Major depressive disorder    Mild neurocognitive disorder, 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   Mitral insufficiency    Mobitz type 1 second degree atrioventricular block    Palpitations    Pseudoaneurysm    of forehead   Pure hypercholesterolemia 10/07/2010   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   This was simply a eschar from prior likely blood blister.  No ulceration underlying.  Nails are thickened elongated and dystrophic x 5 both feet no pain with nails.  Vascular:  Dorsalis Pedis artery and Posterior Tibial artery pedal pulses are 2/4 bilateral.  Capillary fill time < 3 sec to all digits.   Neruologic: Grossly intact via light touch bilateral. Protective threshold intact to all sites bilateral.   Musculoskeletal: No gross boney pedal deformities bilateral. No pain, crepitus, or limitation noted with foot and ankle range of motion bilateral. Muscular strength 5/5 in all groups tested bilateral.  Gait: Unassisted, Nonantalgic.   No images are attached to the encounter.  Assessment:   1. Blood blister   2. Onychomycosis      Plan:  Patient was evaluated and treated and all questions answered.  # Prior blood blister left arch -Discussed with patient that the lesion likely represents some dried blood from prior blood blister on the lesion that is fully healed underlying.  Upon debridement there is healthy skin and the lesion is gone  # Onychomycosis -Discussed nail fungal infection as well as treatment options -Patient wishes to defer treatment at this time continue to monitor he is able to trim his nails without issue  Return if symptoms worsen or fail to improve.          Cato Mulligan Rowene Suto,  DPM [place] [facility]

## 2022-12-21 LAB — AMB RESULTS CONSOLE CBG: Glucose: 118

## 2023-01-27 ENCOUNTER — Ambulatory Visit (INDEPENDENT_AMBULATORY_CARE_PROVIDER_SITE_OTHER): Payer: Medicare Other | Admitting: Orthopedic Surgery

## 2023-01-27 ENCOUNTER — Encounter (HOSPITAL_COMMUNITY): Payer: Self-pay | Admitting: Orthopedic Surgery

## 2023-01-27 ENCOUNTER — Encounter: Payer: Self-pay | Admitting: Orthopedic Surgery

## 2023-01-27 DIAGNOSIS — S8012XA Contusion of left lower leg, initial encounter: Secondary | ICD-10-CM | POA: Diagnosis not present

## 2023-01-27 NOTE — Progress Notes (Signed)
SDW call  Patients wife Nathan Mcdaniel was given pre-op instructions over the phone. [name] verbalized understanding of instructions provided. Patient has Alzeheimers/dementia    PCP - Lenise Herald, PA-C, [facility]  Cardiologist - Dr. [place] Pulmonary:    PPM/ICD - No. Patient has mitral vale replacement  Chest x-ray - 01/04/2020 EKG -  DOS 01/28/2023 Stress Test - ECHO - 02/13/2020 Cardiac Cath -   Sleep Study/sleep apnea/CPAP: denies  Non-diabetic  Blood Thinner Instructions: Coumadin, last dose 01/26/2023. INR 3.6 01/22/2023 Aspirin Instructions:denies   ERAS Protcol - Yes, clear fluids until 0930 PRE-SURGERY Ensure or G2-    COVID TEST- n/a    Anesthesia review: Yes.  HTN, stroke, second degree heart block, A-fib, mitral valve replacement   Patient denies shortness of breath, fever, cough and chest pain over the phone call  Your procedure is scheduled on Wednesday January 28, 2023  Report to [facility] Main Entrance "A" at  1000  A.M., then check in with the Admitting office.  Call this number if you have problems the morning of surgery:  [phone]   If you have any questions prior to your surgery date call [phone]: Open Monday-Friday 8am-4pm If you experience any cold or flu symptoms such as cough, fever, chills, shortness of breath, etc. between now and your scheduled surgery, please notify us at the above number    Remember:  Do not eat after midnight the night before your surgery  You may drink clear liquids until  0930  the morning of your surgery.   Clear liquids allowed are: Water, Non-Citrus Juices (without pulp), Carbonated Beverages, Clear Tea, Black Coffee ONLY (NO MILK, CREAM OR POWDERED CREAMER of any kind), and Gatorade   Take these medicines the morning of surgery with A SIP OF WATER:  Keflex, lexapro  As needed: Tyenol, ativan  As of today, STOP taking any Aspirin (unless otherwise instructed by your surgeon) Aleve, Naproxen, Ibuprofen, Motrin,  Advil, Goody's, BC's, all herbal medications, fish oil, and all vitamins.

## 2023-01-27 NOTE — Anesthesia Preprocedure Evaluation (Addendum)
Anesthesia Evaluation  Patient identified by MRN, date of birth, ID band Patient awake    Reviewed: Allergy & Precautions, NPO status , Patient's Chart, lab work & pertinent test results  History of Anesthesia Complications Negative for: history of anesthetic complications  Airway Mallampati: II  TM Distance: >3 FB Neck ROM: Full    Dental no notable dental hx. (+) Teeth Intact, Dental Advisory Given   Pulmonary former smoker   Pulmonary exam normal breath sounds clear to auscultation       Cardiovascular hypertension, Normal cardiovascular exam Rhythm:Regular Rate:Normal  TTE 2021: EF 50-55%, moderate LVH, moderate LAE, s/p MVR with normal function, mild AR      Neuro/Psych  Headaches  Anxiety Depression    CVA (2019)    GI/Hepatic negative GI ROS, Neg liver ROS,,,  Endo/Other  negative endocrine ROS    Renal/GU negative Renal ROS  negative genitourinary   Musculoskeletal negative musculoskeletal ROS (+)    Abdominal   Peds  Hematology On coumadin   Anesthesia Other Findings Day of surgery medications reviewed with patient.  Reproductive/Obstetrics negative OB ROS                             Anesthesia Physical Anesthesia Plan  ASA: 3  Anesthesia Plan: General   Post-op Pain Management: Tylenol PO (pre-op)* and Precedex   Induction: Intravenous  PONV Risk Score and Plan: 2 and Treatment may vary due to age or medical condition, Dexamethasone, Ondansetron, Propofol infusion and TIVA  Airway Management Planned: LMA  Additional Equipment: None  Intra-op Plan:   Post-operative Plan: Extubation in OR  Informed Consent:      Dental advisory given  Plan Discussed with:   Anesthesia Plan Comments: (PAT note written 01/27/2023 by Shonna Chock, PA-C.  Has mechanical MVR. Cardiologist is Dr. Peter Sw[place].  LMA TIVA)       Anesthesia Quick Evaluation

## 2023-01-27 NOTE — Progress Notes (Signed)
Office Visit Note   Patient: Nathan Mcdaniel           Date of Birth: 1941/04/10           MRN: [id number] Visit Date: 01/27/2023              Requested by: Teena Irani, PA-C [address] [place],  [place] [address] PCP: Teena Irani, PA-C  Chief Complaint  Patient presents with   Left Leg - Open Wound      HPI: Patient is an 82 year old gentleman who was seen for initial evaluation for hematoma left lower leg.  Patient states the injury occurred on June 7.  Patient states that he has seen 3 other doctors for treatment of this injury.  Initial injury occurred when a bookshelf fell striking the medial aspect of the left ankle.  Radiographs were initially negative for fracture on June 10.  Patient agreed return to the clinic with pain and bruising and was provided a compression sock.  Patient was started on Keflex twice a day on June 20.  Patient is currently on Coumadin 3 mg a day his INR is greater than 3 for A-fib and his mitral valve.  Assessment & Plan: Visit Diagnoses:  1. Hematoma of left lower leg     Plan: With the large hematoma is unable to debride this completely in the office.  Will plan for surgical debridement tomorrow placement of tissue graft placement of a wound VAC.  Risks and benefits were discussed including infection nonhealing the wound need for additional surgery.  Patient states he understands wished to proceed at this time.  Follow-Up Instructions: Return in about 2 weeks (around 02/10/2023).   Ortho Exam  Patient is alert, oriented, no adenopathy, well-dressed, normal affect, normal respiratory effort. Examination patient has a good palpable dorsalis pedis pulse he does have A-fib.  There is a 2 cm eschar over the medial left ankle.  After informed consent a 10 blade knife was used to remove the eschar patient had a large deep hematoma that is about 6 cm in diameter.  Due to the extensive hematoma this cannot properly be evacuated and  treated in the office and we will plan for surgical intervention.  Radiograph shows calcification of the vasculature however patient does have a good palpable pulse.  Imaging: No results found.   Labs: No results found for: "HGBA1C", "ESRSEDRATE", "CRP", "LABURIC", "REPTSTATUS", "GRAMSTAIN", "CULT", "LABORGA"   No results found for: "ALBUMIN", "PREALBUMIN", "CBC"  No results found for: "MG" No results found for: "VD25OH"  No results found for: "PREALBUMIN"    Latest Ref Rng & Units 01/26/2021    8:10 PM 01/04/2020   12:26 PM 12/31/2019   10:41 AM  CBC EXTENDED  WBC 4.0 - 10.5 K/uL 7.9  5.0  4.3   RBC 4.22 - 5.81 MIL/uL 4.22  4.93  4.78   Hemoglobin 13.0 - 17.0 g/dL 02.7  25.3  66.4   HCT 39.0 - 52.0 % 37.5  44.7  42.9   Platelets 150 - 400 K/uL 204  238  227   NEUT# 1.7 - 7.7 K/uL 6.5     Lymph# 0.7 - 4.0 K/uL 0.7        There is no height or weight on file to calculate BMI.  Orders:  No orders of the defined types were placed in this encounter.  No orders of the defined types were placed in this encounter.    Procedures: No  procedures performed  Clinical Data: No additional findings.  ROS:  All other systems negative, except as noted in the HPI. Review of Systems  Objective: Vital Signs: There were no vitals taken for this visit.  Specialty Comments:  No specialty comments available.  PMFS History: Patient Active Problem List   Diagnosis Date Noted   Gross hematuria 02/19/2020   Mild neurocognitive disorder, 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   Major depressive disorder    Generalized anxiety disorder    Long term (current) use of anticoagulants 12/17/2017   Mobitz type 1 second degree atrioventricular block    Abnormal ECG    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   Atrial fibrillation 10/14/2011   Hypertension    Palpitations    Mitral insufficiency    Elevated prostate specific antigen (PSA) 11/26/2010   Pure hypercholesterolemia 10/07/2010    Asymptomatic varicose veins 10/07/2010   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   Vitamin D deficiency 10/07/2010   Allergic rhinitis 10/24/2009   Bunion 05/21/2009   Corn or callus 12/26/2008   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   History of skin cancer    Hypertension    Major depressive disorder    Mild neurocognitive disorder, 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   Mitral insufficiency    Mobitz type 1 second degree atrioventricular block    Palpitations    Pseudoaneurysm    of forehead   Pure hypercholesterolemia 10/07/2010   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   Stroke 06/09/2018   Brain MRI revealed two remote tiny infarcts in the left cerebellum   Vitamin D deficiency 10/07/2010    Family History  Problem Relation Age of Onset   Heart failure Mother    Hypertension Mother    Lung cancer Sister     Past Surgical History:  Procedure Laterality Date   CATARACT EXTRACTION, BILATERAL     CHOLECYSTECTOMY     COLON SURGERY     with diversion and resection   FALSE ANEURYSM REPAIR N/A 09/23/2019   Procedure: REPAIR FALSE ANEURYSM FOREHEAD;  Surgeon: Sherren Kerns, MD;  Location: [facility] OR;  Service: Vascular;  Laterality: N/A;   MITRAL VALVE REPLACEMENT  2002   with a st.jude mechanical valve   PROSTATE BIOPSY     TRIGGER FINGER RELEASE Left 06/04/2021   Procedure: RELEASE TRIGGER FINGER/A-1 PULLEY LEFT RING FINGER;  Surgeon: Cindee Salt, MD;  Location: [facility];  Service: Orthopedics;  Laterality: Left;   Social  History   Occupational History   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

## 2023-01-27 NOTE — Progress Notes (Signed)
Anesthesia Chart Review: SAME DAY WORK-UP  Case: [id number] Date/Time: 01/28/23 1219   Procedure: LEFT LEG DEBRIDEMENT (Left)   Anesthesia type: Choice   Pre-op diagnosis: Hematoma Left Leg   [facility] ROOM 04 / [facility]   Surgeons: Nadara Mustard, MD       DISCUSSION: Patient is an 82 year old male scheduled for the above procedure. He was seen on 01/27/23 by Dr. Lajoyce Corners for an open left ankle wound with a large hematoma. A bookshelf fell and struck his left medial ankle on 01/09/23. Xray was negative for fracture. H was started on Keflex 01/22/23. He is on warfarin for afib and mechanical MVR. INR 3.6 on 01/22/23 ([facility]).   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  Last cardiology visit 12/18/21 with Micah Flesher, PA for follow-up mechanical MVR and afib. Off b-blockers in 2022 for bradycardia, history of Mobtiz 1. Echo on 02/13/20 showed  stable valve prosthesis, LVEF 50-55%, moderate LVH, and moderate dilated left atria. One year follow-up recommended.   He is a same day work-up. Anesthesia team to evaluate on the day of surgery. Will need repeat labs including PT/INR--if not therapeutic then consider cardiology and/or pharmacy consult given mechanical MVR. Last EKG noted is > 41 year old.   VS:  BP Readings from Last 3 Encounters:  12/21/22 (!) 118/59  09/07/22 (!) 149/79  12/18/21 126/64   Pulse Readings from Last 3 Encounters:  12/21/22 65  09/07/22 66  12/18/21 (!) 57     PROVIDERS: Lucila Maine is PCP  [place], Peter, MD is cardiologist   LABS: For day of surgery as indicated. Per Labs in Care Everywhere: INR 3.6 on 01/22/23. CMP normal on 11/24/22, Cr 1.05. H/H  12.7/38.3, PLT 191 on 08/07/22.     IMAGES: Xray left ankle 01/12/23 ([facility]): IMPRESSION:  There appears to be localized soft tissue swelling and probable hematoma involving the medial aspect of the proximal left ankle.  No definite acute fracture identified.  Degenerative changes.    EKG: EKG 12/18/21 ([facility]):" The ekg ordered today demonstrates sinus bradycardia HR 57, possible ectopic atrial bradycardia, PVC"  CV:  TTE 02/13/2020: IMPRESSIONS   1. Left ventricular ejection fraction, by estimation, is 50 to 55%. The  left ventricle has low normal function. The left ventricle demonstrates  global hypokinesis. There is moderate concentric left ventricular  hypertrophy. Left ventricular diastolic  function could not be evaluated.   2. Right ventricular systolic function is normal. The right ventricular  size is normal. There is normal pulmonary artery systolic pressure.   3. Left atrial size was moderately dilated.   4. The mitral valve has been repaired/replaced. No evidence of mitral  valve regurgitation. No evidence of mitral stenosis. The mean mitral valve  gradient is 5.0 mmHg. There is a mechanical valve present in the mitral  position. Echo findings are  consistent with normal structure and function of the mitral valve  prosthesis.   5. The aortic valve is normal in structure. Aortic valve regurgitation is  mild. No aortic stenosis is present.   6. The inferior vena cava is normal in size with greater than 50%  respiratory variability, suggesting right atrial pressure of 3 mmHg.   Nuclear stress 08/31/2013:  Overall Impression:  Normal stress nuclear study.   LV Ejection Fraction: 49%.  LV Wall Motion:  Paradoxican septal motion, otherwise no regional wall motion abnormalities, low normal LVEF.   Past Medical History:  Diagnosis Date   Abnormal ECG    Allergic rhinitis 10/24/2009   Asymptomatic varicose veins 10/07/2010   Atrial fibrillation 10/14/2011   Dr  Thomasene Lot follows and checks inr   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   Elevated prostate specific antigen (PSA) 11/26/2010   Generalized anxiety disorder    Headache    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   History of skin cancer    Hypertension    Major depressive disorder    Mild neurocognitive disorder, 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   Mitral insufficiency    Mobitz type 1 second degree atrioventricular block    Palpitations    Pseudoaneurysm    of forehead   Pure hypercholesterolemia 10/07/2010   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   Stroke 06/09/2018   Brain MRI revealed two remote tiny infarcts in the left cerebellum   Vitamin D deficiency 10/07/2010    Past Surgical History:  Procedure Laterality Date   CATARACT EXTRACTION, BILATERAL     CHOLECYSTECTOMY     COLON SURGERY     with diversion and resection   FALSE ANEURYSM REPAIR N/A 09/23/2019   Procedure: REPAIR FALSE ANEURYSM FOREHEAD;  Surgeon: Sherren Kerns, MD;  Location: [facility];  Service: Vascular;  Laterality: N/A;   MITRAL VALVE REPLACEMENT  2002   with a st.jude mechanical valve   PROSTATE BIOPSY     TRIGGER FINGER RELEASE Left 06/04/2021   Procedure: RELEASE TRIGGER FINGER/A-1 PULLEY LEFT RING FINGER;  Surgeon: Cindee Salt, MD;  Location: [facility];  Service: Orthopedics;  Laterality: Left;    MEDICATIONS: No current facility-administered medications for this encounter.    acetaminophen (TYLENOL) 325 MG tablet   cephALEXin (KEFLEX) 500 MG capsule   escitalopram (LEXAPRO) 20 MG tablet   LORazepam (ATIVAN) 0.5 MG tablet   Multiple Vitamin (MULTIVITAMIN WITH MINERALS) TABS tablet   tamsulosin (FLOMAX) 0.4 MG CAPS capsule   warfarin (COUMADIN) 2 MG tablet   warfarin (COUMADIN) 3 MG tablet    Shonna Chock, PA-C Surgical Short Stay/Anesthesiology [facility] Phone [phone] [facility] Phone [phone] 01/27/2023 4:31 PM

## 2023-01-28 ENCOUNTER — Encounter (HOSPITAL_COMMUNITY)
Admission: RE | Disposition: A | Discharge status: Home / Self Care | Payer: Self-pay | Source: Home / Self Care | Attending: Orthopedic Surgery

## 2023-01-28 ENCOUNTER — Ambulatory Visit (HOSPITAL_COMMUNITY)
Admission: RE | Admit: 2023-01-28 | Discharge: 2023-01-28 | Disposition: A | Discharge status: Home / Self Care | Payer: Medicare Other | Attending: Orthopedic Surgery | Admitting: Orthopedic Surgery

## 2023-01-28 ENCOUNTER — Ambulatory Visit (HOSPITAL_COMMUNITY): Payer: Medicare Other | Admitting: Vascular Surgery

## 2023-01-28 ENCOUNTER — Ambulatory Visit (HOSPITAL_BASED_OUTPATIENT_CLINIC_OR_DEPARTMENT_OTHER): Payer: Medicare Other | Admitting: Vascular Surgery

## 2023-01-28 ENCOUNTER — Other Ambulatory Visit: Payer: Self-pay

## 2023-01-28 ENCOUNTER — Encounter (HOSPITAL_COMMUNITY): Payer: Self-pay | Admitting: Orthopedic Surgery

## 2023-01-28 DIAGNOSIS — X58XXXA Exposure to other specified factors, initial encounter: Secondary | ICD-10-CM | POA: Diagnosis not present

## 2023-01-28 DIAGNOSIS — I10 Essential (primary) hypertension: Secondary | ICD-10-CM

## 2023-01-28 DIAGNOSIS — Z85828 Personal history of other malignant neoplasm of skin: Secondary | ICD-10-CM | POA: Diagnosis not present

## 2023-01-28 DIAGNOSIS — E7800 Pure hypercholesterolemia, unspecified: Secondary | ICD-10-CM | POA: Diagnosis not present

## 2023-01-28 DIAGNOSIS — S8012XA Contusion of left lower leg, initial encounter: Secondary | ICD-10-CM

## 2023-01-28 DIAGNOSIS — F0390 Unspecified dementia without behavioral disturbance: Secondary | ICD-10-CM | POA: Diagnosis not present

## 2023-01-28 DIAGNOSIS — F418 Other specified anxiety disorders: Secondary | ICD-10-CM

## 2023-01-28 DIAGNOSIS — Z87891 Personal history of nicotine dependence: Secondary | ICD-10-CM

## 2023-01-28 DIAGNOSIS — I4891 Unspecified atrial fibrillation: Secondary | ICD-10-CM | POA: Insufficient documentation

## 2023-01-28 DIAGNOSIS — F419 Anxiety disorder, unspecified: Secondary | ICD-10-CM | POA: Insufficient documentation

## 2023-01-28 DIAGNOSIS — Z952 Presence of prosthetic heart valve: Secondary | ICD-10-CM | POA: Diagnosis not present

## 2023-01-28 DIAGNOSIS — Z7901 Long term (current) use of anticoagulants: Secondary | ICD-10-CM | POA: Diagnosis not present

## 2023-01-28 DIAGNOSIS — I441 Atrioventricular block, second degree: Secondary | ICD-10-CM | POA: Diagnosis not present

## 2023-01-28 DIAGNOSIS — F32A Depression, unspecified: Secondary | ICD-10-CM | POA: Insufficient documentation

## 2023-01-28 HISTORY — PX: I & D EXTREMITY: SHX5045

## 2023-01-28 HISTORY — DX: Unspecified dementia, unspecified severity, without behavioral disturbance, psychotic disturbance, mood disturbance, and anxiety: F03.90

## 2023-01-28 LAB — CBC
HCT: 38.9 % — ABNORMAL LOW (ref 39.0–52.0)
Hemoglobin: 12.3 g/dL — ABNORMAL LOW (ref 13.0–17.0)
MCH: 28.6 pg (ref 26.0–34.0)
MCHC: 31.6 g/dL (ref 30.0–36.0)
MCV: 90.5 fL (ref 80.0–100.0)
Platelets: 244 10*3/uL (ref 150–400)
RBC: 4.3 MIL/uL (ref 4.22–5.81)
RDW: 13.2 % (ref 11.5–15.5)
WBC: 4.6 10*3/uL (ref 4.0–10.5)
nRBC: 0 % (ref 0.0–0.2)

## 2023-01-28 LAB — BASIC METABOLIC PANEL
Anion gap: 8 (ref 5–15)
BUN: 18 mg/dL (ref 8–23)
CO2: 27 mmol/L (ref 22–32)
Calcium: 8.7 mg/dL — ABNORMAL LOW (ref 8.9–10.3)
Chloride: 104 mmol/L (ref 98–111)
Creatinine, Ser: 0.98 mg/dL (ref 0.61–1.24)
GFR, Estimated: >60 mL/min (ref >60)
Glucose, Bld: 96 mg/dL (ref 70–99)
Potassium: 3.5 mmol/L (ref 3.5–5.1)
Sodium: 139 mmol/L (ref 135–145)

## 2023-01-28 LAB — PROTIME-INR
INR: 2.1 — ABNORMAL HIGH (ref 0.8–1.2)
Prothrombin Time: 23.5 seconds — ABNORMAL HIGH (ref 11.4–15.2)

## 2023-01-28 LAB — APTT: aPTT: 39 seconds — ABNORMAL HIGH (ref 24–36)

## 2023-01-28 SURGERY — IRRIGATION AND DEBRIDEMENT EXTREMITY
Anesthesia: General | Laterality: Left

## 2023-01-28 MED ORDER — LIDOCAINE 2% (20 MG/ML) 5 ML SYRINGE
INTRAMUSCULAR | Status: AC
  Filled 2023-01-28: qty 5

## 2023-01-28 MED ORDER — CHLORHEXIDINE GLUCONATE 0.12 % MT SOLN
15.0000 mL | Freq: Once | OROMUCOSAL | Status: AC
  Administered 2023-01-28: 15 mL via OROMUCOSAL
  Filled 2023-01-28: qty 15

## 2023-01-28 MED ORDER — PHENYLEPHRINE HCL-NACL 20-0.9 MG/250ML-% IV SOLN
INTRAVENOUS | Status: AC
  Filled 2023-01-28: qty 500

## 2023-01-28 MED ORDER — ACETAMINOPHEN 500 MG PO TABS
1000.0000 mg | ORAL_TABLET | Freq: Once | ORAL | Status: AC
  Administered 2023-01-28: 1000 mg via ORAL
  Filled 2023-01-28: qty 2

## 2023-01-28 MED ORDER — CEFAZOLIN SODIUM-DEXTROSE 2-4 GM/100ML-% IV SOLN
2.0000 g | INTRAVENOUS | Status: AC
  Administered 2023-01-28: 2 g via INTRAVENOUS
  Filled 2023-01-28: qty 100

## 2023-01-28 MED ORDER — FENTANYL CITRATE (PF) 100 MCG/2ML IJ SOLN
INTRAMUSCULAR | Status: AC
  Filled 2023-01-28: qty 2

## 2023-01-28 MED ORDER — PROPOFOL 10 MG/ML IV BOLUS
INTRAVENOUS | Status: AC
  Filled 2023-01-28: qty 20

## 2023-01-28 MED ORDER — LIDOCAINE 2% (20 MG/ML) 5 ML SYRINGE
INTRAMUSCULAR | Status: DC | PRN
  Administered 2023-01-28: 80 mg via INTRAVENOUS

## 2023-01-28 MED ORDER — EPHEDRINE SULFATE (PRESSORS) 50 MG/ML IJ SOLN
INTRAMUSCULAR | Status: DC | PRN
  Administered 2023-01-28: 10 mg via INTRAVENOUS

## 2023-01-28 MED ORDER — GLYCOPYRROLATE PF 0.2 MG/ML IJ SOSY
PREFILLED_SYRINGE | INTRAMUSCULAR | Status: DC | PRN
  Administered 2023-01-28: .2 mg via INTRAVENOUS

## 2023-01-28 MED ORDER — DEXAMETHASONE SODIUM PHOSPHATE 10 MG/ML IJ SOLN
INTRAMUSCULAR | Status: AC
  Filled 2023-01-28: qty 1

## 2023-01-28 MED ORDER — FENTANYL CITRATE (PF) 250 MCG/5ML IJ SOLN
INTRAMUSCULAR | Status: AC
  Filled 2023-01-28: qty 5

## 2023-01-28 MED ORDER — LACTATED RINGERS IV SOLN: INTRAVENOUS | Status: DC

## 2023-01-28 MED ORDER — DEXAMETHASONE SODIUM PHOSPHATE 10 MG/ML IJ SOLN
INTRAMUSCULAR | Status: DC | PRN
  Administered 2023-01-28: 5 mg via INTRAVENOUS

## 2023-01-28 MED ORDER — OXYCODONE HCL 5 MG PO TABS: 5.0000 mg | ORAL_TABLET | Freq: Once | ORAL | Status: AC | PRN

## 2023-01-28 MED ORDER — ONDANSETRON HCL 4 MG/2ML IJ SOLN
INTRAMUSCULAR | Status: AC
  Filled 2023-01-28: qty 2

## 2023-01-28 MED ORDER — PROPOFOL 1000 MG/100ML IV EMUL
INTRAVENOUS | Status: AC
  Filled 2023-01-28: qty 100

## 2023-01-28 MED ORDER — PROPOFOL 500 MG/50ML IV EMUL
INTRAVENOUS | Status: DC | PRN
  Administered 2023-01-28: 125 ug/kg/min via INTRAVENOUS

## 2023-01-28 MED ORDER — ORAL CARE MOUTH RINSE: 15.0000 mL | Freq: Once | OROMUCOSAL | Status: AC

## 2023-01-28 MED ORDER — DEXMEDETOMIDINE HCL IN NACL 80 MCG/20ML IV SOLN
INTRAVENOUS | Status: DC | PRN
  Administered 2023-01-28: 8 ug via INTRAVENOUS

## 2023-01-28 MED ORDER — PROPOFOL 10 MG/ML IV BOLUS
INTRAVENOUS | Status: DC | PRN
  Administered 2023-01-28: 140 mg via INTRAVENOUS

## 2023-01-28 MED ORDER — FENTANYL CITRATE (PF) 100 MCG/2ML IJ SOLN
25.0000 ug | INTRAMUSCULAR | Status: DC | PRN
  Administered 2023-01-28 (×3): 25 ug via INTRAVENOUS

## 2023-01-28 MED ORDER — OXYCODONE HCL 5 MG/5ML PO SOLN
ORAL | Status: AC
  Filled 2023-01-28: qty 5

## 2023-01-28 MED ORDER — OXYCODONE HCL 5 MG/5ML PO SOLN
5.0000 mg | Freq: Once | ORAL | Status: AC | PRN
  Administered 2023-01-28: 5 mg via ORAL

## 2023-01-28 MED ORDER — ONDANSETRON HCL 4 MG/2ML IJ SOLN
INTRAMUSCULAR | Status: DC | PRN
  Administered 2023-01-28: 4 mg via INTRAVENOUS

## 2023-01-28 MED ORDER — 0.9 % SODIUM CHLORIDE (POUR BTL) OPTIME
TOPICAL | Status: DC | PRN
  Administered 2023-01-28: 1000 mL

## 2023-01-28 MED ORDER — FENTANYL CITRATE (PF) 250 MCG/5ML IJ SOLN
INTRAMUSCULAR | Status: DC | PRN
  Administered 2023-01-28 (×3): 25 ug via INTRAVENOUS

## 2023-01-28 SURGICAL SUPPLY — 37 items
BAG COUNTER SPONGE SURGICOUNT (BAG) IMPLANT
BAG SPNG CNTER NS LX DISP (BAG)
BLADE SURG 21 STRL SS (BLADE) ×2 IMPLANT
BNDG CMPR 5X6 CHSV STRCH STRL (GAUZE/BANDAGES/DRESSINGS)
BNDG COHESIVE 6X5 TAN ST LF (GAUZE/BANDAGES/DRESSINGS) IMPLANT
BNDG GAUZE DERMACEA FLUFF 4 (GAUZE/BANDAGES/DRESSINGS) ×4 IMPLANT
BNDG GZE DERMACEA 4 6PLY (GAUZE/BANDAGES/DRESSINGS) ×2
COVER SURGICAL LIGHT HANDLE (MISCELLANEOUS) ×4 IMPLANT
DRAPE U-SHAPE 47X51 STRL (DRAPES) ×2 IMPLANT
DRESSING PEEL AND PLC PRVNA 13 (GAUZE/BANDAGES/DRESSINGS) IMPLANT
DRSG ADAPTIC 3X8 NADH LF (GAUZE/BANDAGES/DRESSINGS) ×2 IMPLANT
DRSG PEEL AND PLACE PREVENA 13 (GAUZE/BANDAGES/DRESSINGS) ×1
DURAPREP 26ML APPLICATOR (WOUND CARE) ×2 IMPLANT
ELECT REM PT RETURN 9FT ADLT (ELECTROSURGICAL)
ELECTRODE REM PT RTRN 9FT ADLT (ELECTROSURGICAL) IMPLANT
GAUZE SPONGE 4X4 12PLY STRL (GAUZE/BANDAGES/DRESSINGS) ×2 IMPLANT
GLOVE BIOGEL PI IND STRL 9 (GLOVE) ×2 IMPLANT
GLOVE SURG ORTHO 9.0 STRL STRW (GLOVE) ×2 IMPLANT
GOWN STRL REUS W/ TWL XL LVL3 (GOWN DISPOSABLE) ×4 IMPLANT
GOWN STRL REUS W/TWL XL LVL3 (GOWN DISPOSABLE) ×2
GRAFT SKIN WND SURGICLOSE M95 (Tissue) IMPLANT
HANDPIECE INTERPULSE COAX TIP (DISPOSABLE)
KIT BASIN OR (CUSTOM PROCEDURE TRAY) ×2 IMPLANT
KIT DRSG PREVENA PLUS 7DAY 125 (MISCELLANEOUS) IMPLANT
KIT TURNOVER KIT B (KITS) ×2 IMPLANT
MANIFOLD NEPTUNE II (INSTRUMENTS) ×2 IMPLANT
NS IRRIG 1000ML POUR BTL (IV SOLUTION) ×2 IMPLANT
PACK ORTHO EXTREMITY (CUSTOM PROCEDURE TRAY) ×2 IMPLANT
PAD ARMBOARD 7.5X6 YLW CONV (MISCELLANEOUS) ×4 IMPLANT
SET HNDPC FAN SPRY TIP SCT (DISPOSABLE) IMPLANT
STOCKINETTE IMPERVIOUS 9X36 MD (GAUZE/BANDAGES/DRESSINGS) IMPLANT
SUT ETHILON 2 0 PSLX (SUTURE) ×2 IMPLANT
SWAB COLLECTION DEVICE MRSA (MISCELLANEOUS) ×2 IMPLANT
SWAB CULTURE ESWAB REG 1ML (MISCELLANEOUS) IMPLANT
TOWEL GREEN STERILE (TOWEL DISPOSABLE) ×2 IMPLANT
TUBE CONNECTING 12X1/4 (SUCTIONS) ×2 IMPLANT
YANKAUER SUCT BULB TIP NO VENT (SUCTIONS) ×2 IMPLANT

## 2023-01-28 NOTE — Progress Notes (Signed)
Orthopedic Tech Progress Note Patient Details:  Nathan Mcdaniel March 24, 1941 [id number]  PACU RN called requesting a POST OP SHOE for patient,  Ortho Devices Type of Ortho Device: Postop shoe/boot Ortho Device/Splint Location: LLE Ortho Device/Splint Interventions: Ordered   Post Interventions Patient Tolerated: Well Instructions Provided: Care of device  Nathan Mcdaniel 01/28/2023, 1:26 PM

## 2023-01-28 NOTE — Transfer of Care (Signed)
Immediate Anesthesia Transfer of Care Note  Patient: Nathan Mcdaniel  Procedure(s) Performed: LEFT LEG DEBRIDEMENT (Left)  Patient Location: PACU  Anesthesia Type:General  Level of Consciousness: awake and responds to stimulation  Airway & Oxygen Therapy: Patient Spontanous Breathing and Patient connected to nasal cannula oxygen  Post-op Assessment: Report given to RN and Post -op Vital signs reviewed and stable  Post vital signs: Reviewed and stable  Last Vitals:  Vitals Value Taken Time  BP 122/72 01/28/23 1223  Temp    Pulse 74 01/28/23 1230  Resp 18 01/28/23 1230  SpO2 93 % 01/28/23 1230  Vitals shown include unvalidated device data.  Last Pain:  Vitals:   01/28/23 1021  TempSrc:   PainSc: 0-No pain      Patients Stated Pain Goal: 0 (01/28/23 1021)  Complications: No notable events documented.

## 2023-01-28 NOTE — Anesthesia Procedure Notes (Signed)
Procedure Name: LMA Insertion Date/Time: 01/28/2023 11:48 AM  Performed by: Carolynne Edouard, RNPre-anesthesia Checklist: Patient identified, Emergency Drugs available, Suction available and Patient being monitored Patient Re-evaluated:Patient Re-evaluated prior to induction Oxygen Delivery Method: Circle System Utilized Preoxygenation: Pre-oxygenation with 100% oxygen Induction Type: IV induction Ventilation: Mask ventilation without difficulty LMA: LMA inserted LMA Size: 4.0 Number of attempts: 1 Airway Equipment and Method: Bite block Placement Confirmation: positive ETCO2 Tube secured with: Tape Dental Injury: Teeth and Oropharynx as per pre-operative assessment

## 2023-01-28 NOTE — Anesthesia Postprocedure Evaluation (Signed)
Anesthesia Post Note  Patient: ALEXEY RHOADS  Procedure(s) Performed: LEFT LEG DEBRIDEMENT (Left)     Patient location during evaluation: PACU Anesthesia Type: General Level of consciousness: awake and alert Pain management: pain level controlled Vital Signs Assessment: post-procedure vital signs reviewed and stable Respiratory status: spontaneous breathing, nonlabored ventilation, respiratory function stable and patient connected to nasal cannula oxygen Cardiovascular status: blood pressure returned to baseline and stable Postop Assessment: no apparent nausea or vomiting Anesthetic complications: no   No notable events documented.  Last Vitals:  Vitals:   01/28/23 1315 01/28/23 1325  BP: 131/60 129/61  Pulse: 69 72  Resp: 15 15  Temp:  36.4 C  SpO2: 95% 95%    Last Pain:  Vitals:   01/28/23 1315  TempSrc:   PainSc: Asleep                 Trevor Iha

## 2023-01-28 NOTE — Op Note (Signed)
01/28/2023  12:13 PM  PATIENT:  Nathan Mcdaniel    PRE-OPERATIVE DIAGNOSIS:  Hematoma Left Leg  POST-OPERATIVE DIAGNOSIS:  Same  PROCEDURE:  LEFT LEG EXCISIONAL DEBRIDEMENT with excision skin and soft tissue muscle and fascia. Hematoma sent for cultures. Application Kerecis micro graft 95 cm. Local tissue rearrangement for wound closure 11 x 4 cm and 2 cm deep. Application 13 cm Prevena wound VAC.  SURGEON:  Nadara Mustard, MD  PHYSICIAN ASSISTANT:None ANESTHESIA:   General  PREOPERATIVE INDICATIONS:  Nathan Mcdaniel is a  82 y.o. male with a diagnosis of Hematoma Left Leg who failed conservative measures and elected for surgical management.    The risks benefits and alternatives were discussed with the patient preoperatively including but not limited to the risks of infection, bleeding, nerve injury, cardiopulmonary complications, the need for revision surgery, among others, and the patient was willing to proceed.  OPERATIVE IMPLANTS:   Implant Name Type Inv. Item Serial No. Manufacturer Lot No. LRB No. Used Action  GRAFT SKIN WND SURGICLOSE M95 - [id number] Tissue GRAFT SKIN WND SURGICLOSE M95  [employer] [id number] Left 1 Implanted    @ENCIMAGES @  OPERATIVE FINDINGS: Hematoma was sent for cultures.  OPERATIVE PROCEDURE: Patient was brought the operating room and underwent general anesthetic.  After adequate levels anesthesia were obtained patient's left lower extremity was prepped using DuraPrep draped into a sterile field a timeout was called.  An elliptical incision was made around the ulcerative tissue and around the gangrenous skin changes.  This left a wound that was 11 x 4 cm.  Skin and soft tissue muscle and fascia was excised back to healthy viable margins.  The wound was irrigated with normal saline.  The wound bed was filled with 95 cm Kerecis micro graft.  Local tissue rearrangement was used to close the wound 11 x 4 cm and 2 cm deep.  The tissue margins were  undermined to allow for closure.  A 13 cm Prevena wound VAC was applied this had a good suction fit patient was extubated taken the PACU in stable condition.   DISCHARGE PLANNING:  Antibiotic duration: Complete current antibiotics will adjust according to culture sensitivities.  Weightbearing: Weightbearing as tolerated  Pain medication: Patient will use Tylenol.  Dressing care/ Wound VAC: Continue wound VAC for 1 week  Ambulatory devices: Ambulatory device as normally used at home.  Discharge to: Home.  Follow-up: In the office 1 week post operative.

## 2023-01-28 NOTE — Progress Notes (Signed)
Dr. Lajoyce Corners made aware of patient's PTT PT/INR results from today. Ok per Dr. Lajoyce Corners.

## 2023-01-28 NOTE — H&P (Signed)
Nathan Mcdaniel is an 82 y.o. male.   Chief Complaint: Necrotic ulcer left ankle. HPI: Patient is an 82 year old gentleman who was seen for initial evaluation for hematoma left lower leg.  Patient states the injury occurred on June 7.  Patient states that he has seen 3 other doctors for treatment of this injury.  Initial injury occurred when a bookshelf fell striking the medial aspect of the left ankle.  Radiographs were initially negative for fracture on June 10.  Patient agreed return to the clinic with pain and bruising and was provided a compression sock.  Patient was started on Keflex twice a day on June 20.  Patient is currently on Coumadin 3 mg a day his INR is greater than 3 for A-fib and his mitral valve.   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   History of skin cancer    Hypertension    Major depressive disorder    Mild neurocognitive disorder, 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   Mitral insufficiency    Mobitz type 1 second degree atrioventricular block    Palpitations    Pseudoaneurysm    of forehead   Pure hypercholesterolemia 10/07/2010   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   Stroke 06/09/2018   Brain MRI revealed two remote tiny infarcts in the left cerebellum   Vitamin D deficiency 10/07/2010    Past Surgical History:  Procedure Laterality Date   CATARACT EXTRACTION, BILATERAL     CHOLECYSTECTOMY     COLON SURGERY     with diversion and resection   FALSE ANEURYSM REPAIR N/A 09/23/2019   Procedure: REPAIR FALSE ANEURYSM FOREHEAD;  Surgeon: Sherren Kerns, MD;  Location: [facility] OR;  Service: Vascular;   Laterality: N/A;   MITRAL VALVE REPLACEMENT  2002   with a st.jude mechanical valve   PROSTATE BIOPSY     TRIGGER FINGER RELEASE Left 06/04/2021   Procedure: RELEASE TRIGGER FINGER/A-1 PULLEY LEFT RING FINGER;  Surgeon: Cindee Salt, MD;  Location: [facility];  Service: Orthopedics;  Laterality: Left;    Family History  Problem Relation Age of Onset   Heart failure Mother    Hypertension Mother    Lung cancer Sister    Social History:  reports that he quit smoking about 43 years ago. His smoking use included cigarettes. He has a 14.40 pack-year smoking history. He has never used smokeless tobacco. He reports that he does not currently use alcohol. He reports that he does not use drugs.  Allergies:  Allergies  Allergen Reactions   Aricept [Donepezil Hcl] Other (See Comments)    Hallucinations    Namenda [Memantine Hcl] Other (See Comments)    Hallucinations     No medications prior to admission.    No results found for this or any previous visit (from the past 48 hour(s)). No results found.  Review of Systems  All other systems reviewed and are negative.   There were no vitals taken for this visit. Physical Exam  Patient is alert, oriented, no adenopathy, well-dressed, normal affect, normal respiratory effort. Examination patient has a good palpable dorsalis pedis pulse he does have A-fib.  There is a 2 cm eschar over the medial left ankle.  After informed consent a 10 blade knife was used to remove the eschar patient had a large deep hematoma that is about 6 cm in diameter.  Due to the extensive hematoma this cannot properly be evacuated and treated in the office and we will plan for surgical intervention.  Radiograph shows calcification of the vasculature however patient does have a good palpable pulse. Assessment/Plan 1. Hematoma of left lower leg       Plan: With the large hematoma is unable to debride this completely in the office.  Will plan for surgical  debridement tomorrow placement of tissue graft placement of a wound VAC.  Risks and benefits were discussed including infection nonhealing the wound need for additional surgery.  Patient states he understands wished to proceed at this time.  Nadara Mustard, MD 01/28/2023, 6:58 AM

## 2023-01-28 NOTE — Interval H&P Note (Signed)
History and Physical Interval Note:  01/28/2023 11:33 AM  Nathan Mcdaniel  has presented today for surgery, with the diagnosis of Hematoma Left Leg.  The various methods of treatment have been discussed with the patient and family. After consideration of risks, benefits and other options for treatment, the patient has consented to  Procedure(s): LEFT LEG DEBRIDEMENT (Left) as a surgical intervention.  The patient's history has been reviewed, patient examined, no change in status, stable for surgery.  I have reviewed the patient's chart and labs.  Questions were answered to the patient's satisfaction.     Nathan Mcdaniel

## 2023-01-29 ENCOUNTER — Telehealth: Payer: Self-pay

## 2023-01-29 ENCOUNTER — Encounter (HOSPITAL_COMMUNITY): Payer: Self-pay | Admitting: Orthopedic Surgery

## 2023-01-29 ENCOUNTER — Encounter: Payer: Self-pay | Admitting: Cardiology

## 2023-01-29 ENCOUNTER — Encounter: Payer: Self-pay | Admitting: Orthopedic Surgery

## 2023-01-29 NOTE — Telephone Encounter (Signed)
Received message on the triage vm from the patient's daughter, Nathan Mcdaniel; I called back and spoke with the patient's wife, Nathan Mcdaniel: the patient's leg wrap has slid down, exposing the wound vac attachment. The wound vac is functioning ok (not beeping, not leaking out anywhere). I advised her to rewrap that area with the wrap-gauze they have at home. She asked about what if something occurs over the weekend -- I advised her to call our main line and the provider on call will get in touch and advise.

## 2023-01-30 LAB — AEROBIC/ANAEROBIC CULTURE W GRAM STAIN (SURGICAL/DEEP WOUND)

## 2023-01-31 ENCOUNTER — Encounter (HOSPITAL_BASED_OUTPATIENT_CLINIC_OR_DEPARTMENT_OTHER): Payer: Self-pay | Admitting: Emergency Medicine

## 2023-01-31 ENCOUNTER — Emergency Department (HOSPITAL_BASED_OUTPATIENT_CLINIC_OR_DEPARTMENT_OTHER)
Admission: EM | Admit: 2023-01-31 | Discharge: 2023-02-01 | Disposition: A | Discharge status: Home / Self Care | Payer: Medicare Other | Attending: Emergency Medicine | Admitting: Emergency Medicine

## 2023-01-31 DIAGNOSIS — Z7901 Long term (current) use of anticoagulants: Secondary | ICD-10-CM | POA: Insufficient documentation

## 2023-01-31 DIAGNOSIS — Z4801 Encounter for change or removal of surgical wound dressing: Secondary | ICD-10-CM | POA: Insufficient documentation

## 2023-01-31 DIAGNOSIS — Z5189 Encounter for other specified aftercare: Secondary | ICD-10-CM

## 2023-01-31 NOTE — ED Triage Notes (Signed)
Wound vac problem. Left ankle surgery on Wednesday. Hematoma evac Now wound vac not suctioning and drainage on bandage.

## 2023-01-31 NOTE — ED Notes (Signed)
Wound vac suction does not appear to continue to function while attached to wound. Suction begins when disconnected and will continue briefly when reconnected then stop.

## 2023-01-31 NOTE — ED Provider Notes (Signed)
[facility] EMERGENCY DEPARTMENT AT [facility] Provider Note   CSN: [id number] Arrival date & time: 01/31/23  2159     History {Add pertinent medical, surgical, social history, OB history to HPI:1} Chief Complaint  Patient presents with   Wound Check    Nathan Mcdaniel is a 82 y.o. male.  Patient here with malfunctioning wound VAC.  States he had left ankle surgery on June 26 by Dr. Lajoyce Corners for hematoma.  Has wound VAC in place.  Wound VAC has not been suctioning and drainage for the past 24 hours.  Denies any pain.  Denies any dizziness or lightheadedness.  No chest pain or shortness of breath.  States there is some suctioning briefly but stops when reconnected.  He does take Coumadin for history of mechanical valve.  Noticed some bleeding coming from the wound edges.  No fevers, chills, nausea or vomiting.  No chest pain or shortness of breath.  No dizziness or lightheadedness.  Family states minimal output from wound VAC for the past 24 hours.  The history is provided by the patient and a friend.  Wound Check Pertinent negatives include no chest pain and no shortness of breath.       Home Medications Prior to Admission medications   Medication Sig Start Date End Date Taking? Authorizing Provider  acetaminophen (TYLENOL) 325 MG tablet Take 650 mg by mouth every 6 (six) hours as needed for moderate pain or headache.    [provider]  cephALEXin (KEFLEX) 500 MG capsule Take 500 mg by mouth 4 (four) times daily. 01/22/23 02/01/23  [provider]  escitalopram (LEXAPRO) 20 MG tablet Take 20 mg by mouth in the morning.    [provider]  LORazepam (ATIVAN) 0.5 MG tablet Take 0.5 mg by mouth every 6 (six) hours as needed for anxiety.    [provider]  Multiple Vitamin (MULTIVITAMIN WITH MINERALS) TABS tablet Take 1 tablet by mouth in the morning.    [provider]  tamsulosin (FLOMAX) 0.4 MG CAPS capsule Take 0.4 mg by mouth at  bedtime.    [provider]  warfarin (COUMADIN) 2 MG tablet Take 2 mg by mouth every other day. In the evening. 01/22/23   [provider]  warfarin (COUMADIN) 3 MG tablet TAKE 1/2 - 1 TABLET DAILY OR AS DIRECTED BY COUMADIN CLINIC Patient taking differently: Take 3 mg by mouth every other day. In the evening. 09/21/17   [place], Peter M, MD      Allergies    Aricept Palma Holter hcl] and Teresa Coombs hcl]    Review of Systems   Review of Systems  Constitutional:  Negative for activity change, appetite change and fever.  HENT:  Negative for congestion and rhinorrhea.   Respiratory:  Negative for cough and shortness of breath.   Cardiovascular:  Negative for chest pain.  Gastrointestinal:  Negative for abdominal distention, nausea and vomiting.  Genitourinary:  Negative for dysuria and hematuria.  Musculoskeletal:  Negative for arthralgias and myalgias.  Skin:  Positive for wound.  Neurological:  Negative for dizziness, weakness and light-headedness.   all other systems are negative except as noted in the HPI and PMH.    Physical Exam Updated Vital Signs BP 121/63   Pulse 69   Temp 98.8 F (37.1 C) (Oral)   Resp 20   SpO2 98%  Physical Exam Vitals and nursing note reviewed.  Constitutional:      General: He is not in acute distress.  Appearance: He is well-developed.  HENT:     Head: Normocephalic and atraumatic.     Mouth/Throat:     Pharynx: No oropharyngeal exudate.  Eyes:     Conjunctiva/sclera: Conjunctivae normal.     Pupils: Pupils are equal, round, and reactive to light.  Neck:     Comments: No meningismus. Cardiovascular:     Rate and Rhythm: Normal rate and regular rhythm.     Heart sounds: Normal heart sounds. No murmur heard. Pulmonary:     Effort: Pulmonary effort is normal. No respiratory distress.     Breath sounds: Normal breath sounds.  Abdominal:     Palpations: Abdomen is soft.     Tenderness: There is no abdominal  tenderness. There is no guarding or rebound.  Musculoskeletal:        General: No tenderness. Normal range of motion.     Cervical back: Normal range of motion and neck supple.     Comments: Wound VAC in place to left lower extremity.  Intact DP and PT pulse.  No bleeding around the wound edges.  With able to buckle toes.  No pain.  Calf is nontender  Skin:    General: Skin is warm.  Neurological:     Mental Status: He is alert and oriented to person, place, and time.     Cranial Nerves: No cranial nerve deficit.     Motor: No abnormal muscle tone.     Coordination: Coordination normal.     Comments: No ataxia on finger to nose bilaterally. No pronator drift. 5/5 strength throughout. CN 2-12 intact.Equal grip strength. Sensation intact.   Psychiatric:        Behavior: Behavior normal.     ED Results / Procedures / Treatments   Labs (all labs ordered are listed, but only abnormal results are displayed) Labs Reviewed  CBC WITH DIFFERENTIAL/PLATELET  BASIC METABOLIC PANEL  PROTIME-INR    EKG None  Radiology No results found.  Procedures Procedures  {Document cardiac monitor, telemetry assessment procedure when appropriate:1}  Medications Ordered in ED Medications - No data to display  ED Course/ Medical Decision Making/ A&P   {   Click here for ABCD2, HEART and other calculatorsREFRESH Note before signing :1}                          Medical Decision Making Amount and/or Complexity of Data Reviewed Labs: ordered. Radiology: ordered and independent interpretation performed. Decision-making details documented in ED Course. ECG/medicine tests: ordered and independent interpretation performed. Decision-making details documented in ED Course.   Malfunctioning wound VAC for the past 24 hours.  Neurovascularly intact.  Intact DP and PT pulses.  Vital stable, no distress.  {Document critical care time when appropriate:1} {Document review of labs and clinical decision tools  ie heart score, Chads2Vasc2 etc:1}  {Document your independent review of radiology images, and any outside records:1} {Document your discussion with family members, caretakers, and with consultants:1} {Document social determinants of health affecting pt's care:1} {Document your decision making why or why not admission, treatments were needed:1} Final Clinical Impression(s) / ED Diagnoses Final diagnoses:  None    Rx / DC Orders ED Discharge Orders     None

## 2023-02-01 LAB — CBC WITH DIFFERENTIAL/PLATELET
Abs Immature Granulocytes: 0.02 10*3/uL (ref 0.00–0.07)
Basophils Absolute: 0 10*3/uL (ref 0.0–0.1)
Basophils Relative: 1 %
Eosinophils Absolute: 0.1 10*3/uL (ref 0.0–0.5)
Eosinophils Relative: 2 %
HCT: 36.2 % — ABNORMAL LOW (ref 39.0–52.0)
Hemoglobin: 11.7 g/dL — ABNORMAL LOW (ref 13.0–17.0)
Immature Granulocytes: 0 %
Lymphocytes Relative: 17 %
Lymphs Abs: 1 10*3/uL (ref 0.7–4.0)
MCH: 28.7 pg (ref 26.0–34.0)
MCHC: 32.3 g/dL (ref 30.0–36.0)
MCV: 88.9 fL (ref 80.0–100.0)
Monocytes Absolute: 0.5 10*3/uL (ref 0.1–1.0)
Monocytes Relative: 8 %
Neutro Abs: 4.2 10*3/uL (ref 1.7–7.7)
Neutrophils Relative %: 72 %
Platelets: 258 10*3/uL (ref 150–400)
RBC: 4.07 MIL/uL — ABNORMAL LOW (ref 4.22–5.81)
RDW: 13.3 % (ref 11.5–15.5)
WBC: 5.9 10*3/uL (ref 4.0–10.5)
nRBC: 0 % (ref 0.0–0.2)

## 2023-02-01 LAB — PROTIME-INR
INR: 2 — ABNORMAL HIGH (ref 0.8–1.2)
Prothrombin Time: 23.3 seconds — ABNORMAL HIGH (ref 11.4–15.2)

## 2023-02-01 LAB — BASIC METABOLIC PANEL
Anion gap: 5 (ref 5–15)
BUN: 25 mg/dL — ABNORMAL HIGH (ref 8–23)
CO2: 36 mmol/L — ABNORMAL HIGH (ref 22–32)
Calcium: 8.8 mg/dL — ABNORMAL LOW (ref 8.9–10.3)
Chloride: 102 mmol/L (ref 98–111)
Creatinine, Ser: 0.9 mg/dL (ref 0.61–1.24)
GFR, Estimated: >60 mL/min (ref >60)
Glucose, Bld: 99 mg/dL (ref 70–99)
Potassium: 4.1 mmol/L (ref 3.5–5.1)
Sodium: 143 mmol/L (ref 135–145)

## 2023-02-01 NOTE — ED Notes (Signed)
Pt discharged to home with family; NAD noted 

## 2023-02-01 NOTE — Discharge Instructions (Signed)
Call Dr. Lajoyce Corners in the morning to address wound vac malfunction. Return to the ED with new or worsening symptoms.

## 2023-02-02 ENCOUNTER — Ambulatory Visit: Payer: Medicare Other

## 2023-02-02 ENCOUNTER — Ambulatory Visit: Payer: Medicare Other | Admitting: Cardiology

## 2023-02-02 LAB — AEROBIC/ANAEROBIC CULTURE W GRAM STAIN (SURGICAL/DEEP WOUND)

## 2023-02-03 ENCOUNTER — Encounter: Payer: Medicare Other | Admitting: Family

## 2023-02-04 ENCOUNTER — Other Ambulatory Visit: Payer: Self-pay | Admitting: Family

## 2023-02-04 MED ORDER — CIPROFLOXACIN HCL 500 MG PO TABS: 500.0000 mg | ORAL_TABLET | Freq: Two times a day (BID) | ORAL | 0 refills | Status: DC

## 2023-02-09 ENCOUNTER — Ambulatory Visit (INDEPENDENT_AMBULATORY_CARE_PROVIDER_SITE_OTHER): Payer: Medicare Other | Admitting: Orthopedic Surgery

## 2023-02-09 DIAGNOSIS — S8012XA Contusion of left lower leg, initial encounter: Secondary | ICD-10-CM

## 2023-02-10 ENCOUNTER — Encounter: Payer: Self-pay | Admitting: Orthopedic Surgery

## 2023-02-10 NOTE — Progress Notes (Signed)
Office Visit Note   Patient: Nathan Mcdaniel           Date of Birth: [birth year]           MRN: [id number] Visit Date: 02/09/2023              Requested by: Teena Irani, PA-C [address] [place],  [place] [address] PCP: Teena Irani, PA-C  Chief Complaint  Patient presents with   Left Leg - Follow-up      HPI: Patient is an 82 year old gentleman who is seen in follow-up status post debridement left leg hematoma patient is 2 weeks out from surgery.  Wound VAC was removed on the first postoperative visit the [facility] was already discontinued.  Assessment & Plan: Visit Diagnoses:  1. Hematoma of left lower leg     Plan: Recommended probiotics for his antibiotic therapy.  Continue Dial soap cleansing recommended a extra extra-large compression sock essentially a thigh-high compression sock.  Reevaluate the induration in the popliteal fossa at follow-up.  Follow-Up Instructions: Return in about 1 week (around 02/16/2023).   Ortho Exam  Patient is alert, oriented, no adenopathy, well-dressed, normal affect, normal respiratory effort. Examination the wound bed has healthy granulation tissue this is flat measures 1 x 3 cm there is brawny skin color changes with venous stasis edema there is induration in the popliteal fossa but no cellulitis.  This appears to be just proximal to the compression dressing and likely secondary to swelling.  Imaging: No results found. No images are attached to the encounter.  Labs: Lab Results  Component Value Date   REPTSTATUS 02/02/2023 FINAL 01/28/2023   GRAMSTAIN  01/28/2023    FEW WBC SEEN RARE SQUAMOUS EPITHELIAL CELLS PRESENT NO ORGANISMS SEEN    CULT  01/28/2023    ABUNDANT PSEUDOMONAS AERUGINOSA NO ANAEROBES ISOLATED Performed at [facility], [address], [place], [place] [address]    LABORGA PSEUDOMONAS AERUGINOSA 01/28/2023     No results found for: "ALBUMIN", "PREALBUMIN", "CBC"  No results found  for: "MG" No results found for: "VD25OH"  No results found for: "PREALBUMIN"    Latest Ref Rng & Units 01/31/2023   11:53 PM 01/28/2023   10:30 AM 01/26/2021    8:10 PM  CBC EXTENDED  WBC 4.0 - 10.5 K/uL 5.9  4.6  7.9   RBC 4.22 - 5.81 MIL/uL 4.07  4.30  4.22   Hemoglobin 13.0 - 17.0 g/dL 24.4  01.0  27.2   HCT 39.0 - 52.0 % 36.2  38.9  37.5   Platelets 150 - 400 K/uL 258  244  204   NEUT# 1.7 - 7.7 K/uL 4.2   6.5   Lymph# 0.7 - 4.0 K/uL 1.0   0.7      There is no height or weight on file to calculate BMI.  Orders:  No orders of the defined types were placed in this encounter.  No orders of the defined types were placed in this encounter.    Procedures: No procedures performed  Clinical Data: No additional findings.  ROS:  All other systems negative, except as noted in the HPI. Review of Systems  Objective: Vital Signs: There were no vitals taken for this visit.  Specialty Comments:  No specialty comments available.  PMFS History: Patient Active Problem List   Diagnosis Date Noted   Hematoma of left lower leg 01/28/2023   Gross hematuria 02/19/2020   Mild neurocognitive disorder, 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  Major depressive disorder    Generalized anxiety disorder    Long term (current) use of anticoagulants 12/17/2017   Mobitz type 1 second degree atrioventricular block    Abnormal ECG    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   Atrial fibrillation 10/14/2011   Hypertension    Palpitations    Mitral insufficiency    Elevated prostate specific antigen (PSA) 11/26/2010   Pure hypercholesterolemia 10/07/2010   Asymptomatic varicose veins 10/07/2010   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   Vitamin D deficiency 10/07/2010   Allergic rhinitis 10/24/2009   Bunion 05/21/2009   Corn or callus 12/26/2008   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   Past Medical History:  Diagnosis Date   Abnormal ECG    Allergic  rhinitis 10/24/2009   Asymptomatic varicose veins 10/07/2010   Atrial fibrillation 10/14/2011   Dr Thomasene Lot follows and checks inr   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   Dementia (HCC)    Elevated prostate specific antigen (PSA) 11/26/2010   Generalized anxiety disorder    Headache    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   History of skin cancer    Hypertension    Major depressive disorder    Mild neurocognitive disorder, 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   Mitral insufficiency    Mobitz type 1 second degree atrioventricular block    Palpitations    Pseudoaneurysm    of forehead   Pure hypercholesterolemia 10/07/2010   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   Stroke 06/09/2018   Brain MRI revealed two remote tiny infarcts in the left cerebellum   Vitamin D deficiency 10/07/2010    Family History  Problem Relation Age of Onset   Heart failure Mother    Hypertension Mother    Lung cancer Sister     Past Surgical History:  Procedure Laterality Date   CATARACT EXTRACTION, BILATERAL     CHOLECYSTECTOMY     COLON SURGERY     with diversion and resection   FALSE ANEURYSM REPAIR N/A 09/23/2019   Procedure: REPAIR FALSE ANEURYSM FOREHEAD;  Surgeon: Sherren Kerns, MD;  Location: [facility];  Service: Vascular;  Laterality: N/A;   I & D EXTREMITY Left 01/28/2023   Procedure: LEFT LEG DEBRIDEMENT;  Surgeon: Nadara Mustard, MD;  Location: [facility] OR;  Service: Orthopedics;  Laterality: Left;   MITRAL VALVE REPLACEMENT  2002   with a st.jude mechanical valve   PROSTATE BIOPSY     TRIGGER FINGER RELEASE Left 06/04/2021   Procedure: RELEASE TRIGGER FINGER/A-1 PULLEY LEFT RING FINGER;  Surgeon: Cindee Salt, MD;  Location: [facility];  Service: Orthopedics;  Laterality: Left;   Social History   Occupational History   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

## 2023-02-16 ENCOUNTER — Ambulatory Visit (INDEPENDENT_AMBULATORY_CARE_PROVIDER_SITE_OTHER): Payer: Medicare Other | Admitting: Orthopedic Surgery

## 2023-02-16 DIAGNOSIS — S8012XA Contusion of left lower leg, initial encounter: Secondary | ICD-10-CM

## 2023-02-17 ENCOUNTER — Encounter: Payer: Self-pay | Admitting: Orthopedic Surgery

## 2023-02-17 NOTE — Progress Notes (Signed)
Office Visit Note   Patient: Nathan Mcdaniel           Date of Birth: June 02, 1941           MRN: [id number] Visit Date: 02/16/2023              Requested by: Teena Irani, PA-C [address] [place],  [place] [address] PCP: Teena Irani, PA-C  Chief Complaint  Patient presents with   Left Leg - Routine Post Op    01/28/23 left leg debridement      HPI: Patient is an 82 year old gentleman who is 3 weeks status post debridement left leg hematoma.  Assessment & Plan: Visit Diagnoses:  1. Hematoma of left lower leg     Plan: Continue with Dial soap cleansing and the compression sock.  Follow-Up Instructions: Return in about 3 weeks (around 03/09/2023).   Ortho Exam  Patient is alert, oriented, no adenopathy, well-dressed, normal affect, normal respiratory effort. Examination there is a small ulcer 1 cm diameter with no induration.  There is healthy granulation tissue.  Sutures are harvested.  No clinical signs of DVT.  Imaging: No results found. No images are attached to the encounter.  Labs: Lab Results  Component Value Date   REPTSTATUS 02/02/2023 FINAL 01/28/2023   GRAMSTAIN  01/28/2023    FEW WBC SEEN RARE SQUAMOUS EPITHELIAL CELLS PRESENT NO ORGANISMS SEEN    CULT  01/28/2023    ABUNDANT PSEUDOMONAS AERUGINOSA NO ANAEROBES ISOLATED Performed at [facility], [address], [place], [place] [address]    LABORGA PSEUDOMONAS AERUGINOSA 01/28/2023     No results found for: "ALBUMIN", "PREALBUMIN", "CBC"  No results found for: "MG" No results found for: "VD25OH"  No results found for: "PREALBUMIN"    Latest Ref Rng & Units 01/31/2023   11:53 PM 01/28/2023   10:30 AM 01/26/2021    8:10 PM  CBC EXTENDED  WBC 4.0 - 10.5 K/uL 5.9  4.6  7.9   RBC 4.22 - 5.81 MIL/uL 4.07  4.30  4.22   Hemoglobin 13.0 - 17.0 g/dL 78.4  69.6  29.5   HCT 39.0 - 52.0 % 36.2  38.9  37.5   Platelets 150 - 400 K/uL 258  244  204   NEUT# 1.7 - 7.7 K/uL 4.2    6.5   Lymph# 0.7 - 4.0 K/uL 1.0   0.7      There is no height or weight on file to calculate BMI.  Orders:  No orders of the defined types were placed in this encounter.  No orders of the defined types were placed in this encounter.    Procedures: No procedures performed  Clinical Data: No additional findings.  ROS:  All other systems negative, except as noted in the HPI. Review of Systems  Objective: Vital Signs: There were no vitals taken for this visit.  Specialty Comments:  No specialty comments available.  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   Atrial fibrillation 10/14/2011   Hypertension    Palpitations    Mitral insufficiency  Elevated prostate specific antigen (PSA) 11/26/2010   Pure hypercholesterolemia 10/07/2010   Asymptomatic varicose veins 10/07/2010   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   Vitamin D deficiency 10/07/2010   Allergic rhinitis 10/24/2009   Bunion 05/21/2009   Corn or callus 12/26/2008   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   History of skin cancer    Hypertension    Major  depressive disorder    Mild neurocognitive disorder, 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   Mitral insufficiency    Mobitz type 1 second degree atrioventricular block    Palpitations    Pseudoaneurysm    of forehead   Pure hypercholesterolemia 10/07/2010   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   Stroke 06/09/2018   Brain MRI revealed two remote tiny infarcts in the left cerebellum   Vitamin D deficiency 10/07/2010    Family History  Problem Relation Age of Onset   Heart failure Mother    Hypertension Mother    Lung cancer Sister     Past Surgical History:  Procedure Laterality Date   CATARACT EXTRACTION, BILATERAL     CHOLECYSTECTOMY     COLON SURGERY     with diversion and resection   FALSE ANEURYSM REPAIR N/A 09/23/2019   Procedure: REPAIR FALSE ANEURYSM FOREHEAD;  Surgeon: Sherren Kerns, MD;  Location: [facility] OR;  Service: Vascular;  Laterality: N/A;   I & D EXTREMITY Left 01/28/2023   Procedure: LEFT LEG DEBRIDEMENT;  Surgeon: Nadara Mustard, MD;  Location: [facility] OR;  Service: Orthopedics;  Laterality: Left;   MITRAL VALVE REPLACEMENT  2002   with a st.jude mechanical valve   PROSTATE BIOPSY     TRIGGER FINGER RELEASE Left 06/04/2021   Procedure: RELEASE TRIGGER FINGER/A-1 PULLEY LEFT RING FINGER;  Surgeon: Cindee Salt, MD;  Location: [facility];  Service: Orthopedics;  Laterality: Left;   Social History   Occupational History   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

## 2023-02-21 ENCOUNTER — Encounter: Payer: Self-pay | Admitting: Orthopedic Surgery

## 2023-03-06 ENCOUNTER — Ambulatory Visit: Payer: Medicare Other | Admitting: Podiatry

## 2023-03-09 ENCOUNTER — Encounter: Payer: Medicare Other | Admitting: Orthopedic Surgery

## 2023-03-09 ENCOUNTER — Ambulatory Visit (INDEPENDENT_AMBULATORY_CARE_PROVIDER_SITE_OTHER): Payer: Medicare Other | Admitting: Orthopedic Surgery

## 2023-03-09 DIAGNOSIS — S8012XA Contusion of left lower leg, initial encounter: Secondary | ICD-10-CM

## 2023-03-10 ENCOUNTER — Encounter: Payer: Self-pay | Admitting: Orthopedic Surgery

## 2023-03-10 NOTE — Progress Notes (Signed)
Office Visit Note   Patient: Nathan Mcdaniel           Date of Birth: [birth year]           MRN: [id number] Visit Date: 03/09/2023              Requested by: Teena Irani, PA-C [address] [place],  [place] [address] PCP: Teena Irani, PA-C  Chief Complaint  Patient presents with   Left Leg - Follow-up      HPI: Patient is an 82 year old gentleman who is 6 weeks status post debridement left leg hematoma currently wearing knee-high compression stockings.  Assessment & Plan: Visit Diagnoses:  1. Hematoma of left lower leg     Plan: Patient will continue with Dial soap cleansing and the compression sock.  Follow-Up Instructions: Return in about 4 weeks (around 04/06/2023).   Ortho Exam  Patient is alert, oriented, no adenopathy, well-dressed, normal affect, normal respiratory effort. Examination there is no drainage there is a 1 x 2 cm scab without cellulitis the remainder of the incision is well-healed.  Imaging: No results found. No images are attached to the encounter.  Labs: Lab Results  Component Value Date   REPTSTATUS 02/02/2023 FINAL 01/28/2023   GRAMSTAIN  01/28/2023    FEW WBC SEEN RARE SQUAMOUS EPITHELIAL CELLS PRESENT NO ORGANISMS SEEN    CULT  01/28/2023    ABUNDANT PSEUDOMONAS AERUGINOSA NO ANAEROBES ISOLATED Performed at [facility], [address], [place], [place] [address]    LABORGA PSEUDOMONAS AERUGINOSA 01/28/2023     No results found for: "ALBUMIN", "PREALBUMIN", "CBC"  No results found for: "MG" No results found for: "VD25OH"  No results found for: "PREALBUMIN"    Latest Ref Rng & Units 01/31/2023   11:53 PM 01/28/2023   10:30 AM 01/26/2021    8:10 PM  CBC EXTENDED  WBC 4.0 - 10.5 K/uL 5.9  4.6  7.9   RBC 4.22 - 5.81 MIL/uL 4.07  4.30  4.22   Hemoglobin 13.0 - 17.0 g/dL 95.2  84.1  32.4   HCT 39.0 - 52.0 % 36.2  38.9  37.5   Platelets 150 - 400 K/uL 258  244  204   NEUT# 1.7 - 7.7 K/uL 4.2   6.5    Lymph# 0.7 - 4.0 K/uL 1.0   0.7      There is no height or weight on file to calculate BMI.  Orders:  No orders of the defined types were placed in this encounter.  No orders of the defined types were placed in this encounter.    Procedures: No procedures performed  Clinical Data: No additional findings.  ROS:  All other systems negative, except as noted in the HPI. Review of Systems  Objective: Vital Signs: There were no vitals taken for this visit.  Specialty Comments:  No specialty comments available.  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   Atrial fibrillation 10/14/2011   Hypertension    Palpitations    Mitral insufficiency    Elevated prostate specific antigen (PSA) 11/26/2010  Pure hypercholesterolemia 10/07/2010   Asymptomatic varicose veins 10/07/2010   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   Vitamin D deficiency 10/07/2010   Allergic rhinitis 10/24/2009   Bunion 05/21/2009   Corn or callus 12/26/2008   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   History of skin cancer    Hypertension    Major depressive  disorder    Mild neurocognitive disorder, 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   Mitral insufficiency    Mobitz type 1 second degree atrioventricular block    Palpitations    Pseudoaneurysm    of forehead   Pure hypercholesterolemia 10/07/2010   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   Stroke 06/09/2018   Brain MRI revealed two remote tiny infarcts in the left cerebellum   Vitamin D deficiency 10/07/2010    Family History  Problem Relation Age of Onset   Heart failure Mother    Hypertension Mother    Lung cancer Sister     Past Surgical History:  Procedure Laterality Date   CATARACT EXTRACTION, BILATERAL     CHOLECYSTECTOMY     COLON SURGERY     with diversion and resection   FALSE ANEURYSM REPAIR N/A 09/23/2019   Procedure: REPAIR FALSE ANEURYSM FOREHEAD;  Surgeon: Sherren Kerns, MD;  Location: [facility] OR;  Service: Vascular;  Laterality: N/A;   I & D EXTREMITY Left 01/28/2023   Procedure: LEFT LEG DEBRIDEMENT;  Surgeon: Nadara Mustard, MD;  Location: [facility] OR;  Service: Orthopedics;  Laterality: Left;   MITRAL VALVE REPLACEMENT  2002   with a st.jude mechanical valve   PROSTATE BIOPSY     TRIGGER FINGER RELEASE Left 06/04/2021   Procedure: RELEASE TRIGGER FINGER/A-1 PULLEY LEFT RING FINGER;  Surgeon: Cindee Salt, MD;  Location: [facility];  Service: Orthopedics;  Laterality: Left;   Social History   Occupational History   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

## 2023-03-18 NOTE — Progress Notes (Unsigned)
Cardiology Clinic Note   Date: 03/19/2023 ID: OSHEA LAMORE, DOB 28-Jun-1941, MRN [id number]  Primary Cardiologist:  Peter Swaziland, MD  Patient Profile    Nathan Mcdaniel is a 82 y.o. male who presents to the clinic today for evaluation of dizziness.     Past medical history significant for: Mitral insufficiency. Mechanical MVR 2002. Echo 02/13/2020: EF 50 to 55%.  Global hypokinesis.  Moderate concentric LVH.  Diastolic function could not be evaluated.  Normal RV function.  Normal PA pressure.  Moderate LAE.  No MR, mitral stenosis.  Mean gradient 5 mmHg.  Mechanical valve present in the mitral position.  Normal structure and function of the mitral valve prosthesis. Mobitz type I second-degree AV block. PACs. Hypertension. Hyperlipidemia. Dementia.      History of Present Illness    Nathan Mcdaniel is a longtime patient of cardiology.  He is followed by Dr. Swaziland for the above outlined history.  In summary, patient with history of mitral insufficiency s/p mechanical MVR 2002 anticoagulated with Coumadin.  He had a normal nuclear stress test and echo in 2015.  Noted to have second-degree Mobitz 1 AV block and beta-blocker was reduced.  PACs managed with metoprolol then switched to Inderal for management of tremors.  Inderal discontinued secondary to bradycardia in the 40s to 50s in November 2022.  Echo July 2021 showed low normal LV function, normal structure and function of mitral valve prosthesis.  Patient was last seen in the office by Bettina Gavia, PA-C on 12/18/2021 for routine follow-up.  He was doing well at that time and no changes were made.  Today, patient is accompanied by his daughter. He is a difficult historian and daughter assists. She reports patient has been experiencing dizziness with position changes. He has not had any falls recently. He was seen by PCP at [facility] yesterday and had orthostatic hypotension. She also reports that patient had a hematoma that had to be  debrided and required a wound vac. They were told that he had "calcium in his veins" and may need an ultrasound. Patient denies lower extremity pain. Upon further discussion with patient he denies feeling dizzy with position changes but does get slightly woozy or unsteady when he moves too fast. He states this is not new and he knows what is happening so he does not get worried. He denies chest pain or shortness of breath.     ROS: All other systems reviewed and are otherwise negative except as noted in History of Present Illness.  Studies Reviewed     EKG not ordered today.      Physical Exam    VS:  BP (!) 147/68   Pulse (!) 54   Ht 5\' 6"  (1.676 m)   Wt 147 lb 12.8 oz (67 kg)   SpO2 96%   BMI 23.86 kg/m  , BMI Body mass index is 23.86 kg/m.  Orthostatic VS for the past 24 hrs (Last 3 readings):  BP- Lying Pulse- Lying BP- Sitting Pulse- Sitting BP- Standing at 0 minutes Pulse- Standing at 0 minutes BP- Standing at 3 minutes Pulse- Standing at 3 minutes  03/19/23 1559 120/62 67 138/80 70 102/58 72 100/54 71     GEN: Well nourished, well developed, in no acute distress. Neck: No JVD or carotid bruits. Cardiac:  RRR. No murmurs. No rubs or gallops.   Respiratory:  Respirations regular and unlabored. Clear to auscultation without rales, wheezing or rhonchi. GI: Soft, nontender, nondistended. Extremities: Radials/DP/PT  2+ and equal bilaterally. No clubbing or cyanosis. No edema.  Skin: Warm and dry, no rash. Neuro: Strength intact.  Assessment & Plan    Mitral insufficiency.  S/p mechanical MVR 2002.  Echo July 2021 showed low normal LV function, normal structure and function of mitral valve prosthesis.  Patient denies shortness of breath, DOE, presyncope or syncope.  Denies spontaneous bleeding concerns.  Will get repeat echo.  Continue Coumadin.  INR managed by [facility]. Orthostatic hypotension.  Patient's daughter reports he was found to have orthostatic hypotension at  PCP 1 day ago.  Daughter states patient becomes dizzy with position changes.  Patient disagrees stating he sometimes feels somewhat unsteady when he moves too fast but he knows what is happening and does not worry him.  Orthostatic vitals performed today show a drop of 36 mmHg from sitting to standing without recovery 3 minutes of standing.  It is difficult to ascertain if patient felt symptomatic with sit to stand.  He has 1 compression sock on his left leg secondary to recent wound debridement.  Discussed wearing bilateral compression socks, increasing hydration, liberalizing salt when symptomatic, and slow position changes. Mobitz type I second-degree AV block.  Continue to avoid beta-blockers. PVCs.  Patient initially managed with metoprolol which was changed to Inderal (to help manage tremors).  Inderal stopped secondary to bradycardia November 2022.  Patient denies palpitations.  RRR on exam today.  Disposition: Echo.  Return in 6 to 8 weeks or sooner as needed.         Signed, Etta Grandchild. Emelina Hinch, DNP, NP-C

## 2023-03-19 ENCOUNTER — Encounter: Payer: Self-pay | Admitting: Student

## 2023-03-19 ENCOUNTER — Ambulatory Visit: Payer: Medicare Other | Attending: Cardiology | Admitting: Student

## 2023-03-19 VITALS — BP 147/68 | HR 54 | Ht 66.0 in | Wt 147.8 lb

## 2023-03-19 DIAGNOSIS — Z952 Presence of prosthetic heart valve: Secondary | ICD-10-CM

## 2023-03-19 DIAGNOSIS — I340 Nonrheumatic mitral (valve) insufficiency: Secondary | ICD-10-CM | POA: Diagnosis not present

## 2023-03-19 DIAGNOSIS — I441 Atrioventricular block, second degree: Secondary | ICD-10-CM | POA: Diagnosis not present

## 2023-03-19 DIAGNOSIS — I951 Orthostatic hypotension: Secondary | ICD-10-CM | POA: Diagnosis not present

## 2023-03-19 DIAGNOSIS — I493 Ventricular premature depolarization: Secondary | ICD-10-CM

## 2023-03-19 NOTE — Patient Instructions (Signed)
Medication Instructions:  No changes *If you need a refill on your cardiac medications before your next appointment, please call your pharmacy*   Lab Work: No changes If you have labs (blood work) drawn today and your tests are completely normal, you will receive your results only by: MyChart Message (if you have MyChart) OR A paper copy in the mail If you have any lab test that is abnormal or we need to change your treatment, we will call you to review the results.   Testing/Procedures:  Your physician has requested that you have an echocardiogram. Echocardiography is a painless test that uses sound waves to create images of your heart. It provides your doctor with information about the size and shape of your heart and how well your heart's chambers and valves are working. This procedure takes approximately one hour. There are no restrictions for this procedure. Please do NOT wear cologne, aftershave, or lotions (deodorant is allowed). Please arrive 15 minutes prior to your appointment time.    Follow-Up: At [facility], you and your health needs are our priority.  As part of our continuing mission to provide you with exceptional heart care, we have created designated Provider Care Teams.  These Care Teams include your primary Cardiologist (physician) and Advanced Practice Providers (APPs -  Physician Assistants and Nurse Practitioners) who all work together to provide you with the care you need, when you need it.  We recommend signing up for the patient portal called "MyChart".  Sign up information is provided on this After Visit Summary.  MyChart is used to connect with patients for Virtual Visits (Telemedicine).  Patients are able to view lab/test results, encounter notes, upcoming appointments, etc.  Non-urgent messages can be sent to your provider as well.   To learn more about what you can do with MyChart, go to [email or web address].    Your next appointment:   6-8  week(s)  Provider:   Peter Swaziland, MD

## 2023-04-07 ENCOUNTER — Ambulatory Visit (INDEPENDENT_AMBULATORY_CARE_PROVIDER_SITE_OTHER): Payer: Medicare Other | Admitting: Orthopedic Surgery

## 2023-04-07 DIAGNOSIS — S8012XA Contusion of left lower leg, initial encounter: Secondary | ICD-10-CM

## 2023-04-14 ENCOUNTER — Ambulatory Visit (HOSPITAL_COMMUNITY): Payer: Medicare Other | Attending: Student

## 2023-04-14 ENCOUNTER — Encounter: Payer: Self-pay | Admitting: Orthopedic Surgery

## 2023-04-14 DIAGNOSIS — Z952 Presence of prosthetic heart valve: Secondary | ICD-10-CM | POA: Insufficient documentation

## 2023-04-14 LAB — ECHOCARDIOGRAM COMPLETE
Area-P 1/2: 2.56 cm2
MV VTI: 1.59 cm2
P 1/2 time: 466 ms
S' Lateral: 4.3 cm

## 2023-04-14 NOTE — Progress Notes (Signed)
Office Visit Note   Patient: Nathan Mcdaniel           Date of Birth: 20-Aug-1940           MRN: [id number] Visit Date: 04/07/2023              Requested by: Teena Irani, PA-C [address] [place],  [place] [address] PCP: Teena Irani, PA-C  Chief Complaint  Patient presents with   Left Leg - Routine Post Op    01/28/23 LLE Deb      HPI: Patient is an 82 year old gentleman status post  Assessment & Plan: Visit Diagnoses:  1. Hematoma of left lower leg   Left lower extremity debridement for hematoma on June 26.  Patient is 2-1/2 months out from surgery.  Patient is currently using Dial soap cleansing and compression.  Plan: Recommended knee-high compression socks.  No restrictions with activities.  Follow-Up Instructions: Return if symptoms worsen or fail to improve.   Ortho Exam  Patient is alert, oriented, no adenopathy, well-dressed, normal affect, normal respiratory effort. Examination the incision is well-healed.  Patient does have venous insufficiency without ulceration.  Imaging: No results found. No images are attached to the encounter.  Labs: Lab Results  Component Value Date   REPTSTATUS 02/02/2023 FINAL 01/28/2023   GRAMSTAIN  01/28/2023    FEW WBC SEEN RARE SQUAMOUS EPITHELIAL CELLS PRESENT NO ORGANISMS SEEN    CULT  01/28/2023    ABUNDANT PSEUDOMONAS AERUGINOSA NO ANAEROBES ISOLATED Performed at [facility], [address], [place], [place] [address]    LABORGA PSEUDOMONAS AERUGINOSA 01/28/2023     No results found for: "ALBUMIN", "PREALBUMIN", "CBC"  No results found for: "MG" No results found for: "VD25OH"  No results found for: "PREALBUMIN"    Latest Ref Rng & Units 01/31/2023   11:53 PM 01/28/2023   10:30 AM 01/26/2021    8:10 PM  CBC EXTENDED  WBC 4.0 - 10.5 K/uL 5.9  4.6  7.9   RBC 4.22 - 5.81 MIL/uL 4.07  4.30  4.22   Hemoglobin 13.0 - 17.0 g/dL 29.5  18.8  41.6   HCT 39.0 - 52.0 % 36.2  38.9  37.5    Platelets 150 - 400 K/uL 258  244  204   NEUT# 1.7 - 7.7 K/uL 4.2   6.5   Lymph# 0.7 - 4.0 K/uL 1.0   0.7      There is no height or weight on file to calculate BMI.  Orders:  No orders of the defined types were placed in this encounter.  No orders of the defined types were placed in this encounter.    Procedures: No procedures performed  Clinical Data: No additional findings.  ROS:  All other systems negative, except as noted in the HPI. Review of Systems  Objective: Vital Signs: There were no vitals taken for this visit.  Specialty Comments:  No specialty comments available.  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   Atrial fibrillation 10/14/2011   Hypertension    Palpitations  Mitral insufficiency    Elevated prostate specific antigen (PSA) 11/26/2010   Pure hypercholesterolemia 10/07/2010   Asymptomatic varicose veins 10/07/2010   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   Vitamin D deficiency 10/07/2010   Allergic rhinitis 10/24/2009   Bunion 05/21/2009   Corn or callus 12/26/2008   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   Dementia (HCC)    Elevated prostate specific antigen (PSA) 11/26/2010   Generalized anxiety disorder    Headache    Herpes simplex virus (HSV) infection  03/10/2008   History of skin cancer    Hypertension    Major depressive disorder    Mild neurocognitive disorder, 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   Mitral insufficiency    Mobitz type 1 second degree atrioventricular block    Palpitations    Pseudoaneurysm    of forehead   Pure hypercholesterolemia 10/07/2010   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   Stroke 06/09/2018   Brain MRI revealed two remote tiny infarcts in the left cerebellum   Vitamin D deficiency 10/07/2010    Family History  Problem Relation Age of Onset   Heart failure Mother    Hypertension Mother    Lung cancer Sister     Past Surgical History:  Procedure Laterality Date   CATARACT EXTRACTION, BILATERAL     CHOLECYSTECTOMY     COLON SURGERY     with diversion and resection   FALSE ANEURYSM REPAIR N/A 09/23/2019   Procedure: REPAIR FALSE ANEURYSM FOREHEAD;  Surgeon: Sherren Kerns, MD;  Location: [facility];  Service: Vascular;  Laterality: N/A;   I & D EXTREMITY Left 01/28/2023   Procedure: LEFT LEG DEBRIDEMENT;  Surgeon: Nadara Mustard, MD;  Location: [facility] OR;  Service: Orthopedics;  Laterality: Left;   MITRAL VALVE REPLACEMENT  2002   with a st.jude mechanical valve   PROSTATE BIOPSY     TRIGGER FINGER RELEASE Left 06/04/2021   Procedure: RELEASE TRIGGER FINGER/A-1 PULLEY LEFT RING FINGER;  Surgeon: Cindee Salt, MD;  Location:  SURGERY CENTER;  Service: Orthopedics;  Laterality: Left;   Social History   Occupational History   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

## 2023-04-15 ENCOUNTER — Other Ambulatory Visit: Payer: Self-pay | Admitting: Physician Assistant

## 2023-04-15 DIAGNOSIS — R410 Disorientation, unspecified: Secondary | ICD-10-CM

## 2023-04-19 ENCOUNTER — Ambulatory Visit
Admission: RE | Admit: 2023-04-19 | Discharge: 2023-04-19 | Disposition: A | Discharge status: Home / Self Care | Payer: Medicare Other | Source: Ambulatory Visit | Attending: Physician Assistant | Admitting: Physician Assistant

## 2023-04-19 DIAGNOSIS — R410 Disorientation, unspecified: Secondary | ICD-10-CM

## 2023-04-30 ENCOUNTER — Telehealth: Payer: Self-pay

## 2023-04-30 NOTE — Telephone Encounter (Signed)
Pt's wife, Wynona Canes, called requesting an appt for the pt who is c/o difficulty with circulation, icy cold feet, and VV.  Reviewed pt's chart, two identifiers used. Informed her that since it's been over 3 yrs since he's been seen, she would need to get a referral from his PCP. Confirmed understanding.

## 2023-05-04 ENCOUNTER — Telehealth: Payer: Self-pay | Admitting: Student

## 2023-05-04 MED ORDER — EMPAGLIFLOZIN 10 MG PO TABS: 10.0000 mg | ORAL_TABLET | Freq: Every day | ORAL | 1 refills | Status: DC

## 2023-05-04 NOTE — Telephone Encounter (Signed)
Discussed results of echo with patient's wife at his request. They are agreeable with starting Jardiance 10 mg daily. Can discuss echo further at follow up visit with Dr. [place].   Etta Grandchild. Ghalia Reicks, DNP, NP-C  05/04/2023, 5:34 PM [facility] [address] Office [phone] Fax [phone]

## 2023-05-08 ENCOUNTER — Encounter (INDEPENDENT_AMBULATORY_CARE_PROVIDER_SITE_OTHER): Payer: Self-pay

## 2023-05-09 NOTE — Progress Notes (Signed)
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 He has a history of PACs. In January 2015 he was noted to have marked ST-T wave changes on Ecg.Echo and Nuclear stress test were done with good results. He was also noted to have 2nd degree AV block and metoprolol dose was reduced. He has since been switched to Inderal for management of a tremor.   He presented to the emergency department on 01/04/2020 with chest discomfort.  He reported that he woke up that morning at 10 AM with chest pain.  He was unable to describe the pain as sharp or dull or pressure.  He stated it lasted for around 2 hours and then resolved.  He did not have symptoms of dizziness, diaphoresis, nausea, shortness of breath, abdominal pain, numbness, or weakness.  His EKG showed sinus rhythm first-degree AV block 64 bpm.  His high-sensitivity troponins were 6 and 6.  His chest x-ray showed his healed median sternotomy with mitral valve prosthesis, and no acute abnormality of his lungs. Echocardiogram was done for further evaluation .  His echocardiogram 02/13/2020 showed LVEF of 50-55%, moderate LVH, moderately dilated left atria, and normally functioning mitral valve prosthesis.  He was last seen in August. Had noted positional dizziness and PCP noted he was orthostatic. This was confirmed on his follow up visit with [place]. He also had developed a hematoma left leg that required drainage and a wound vac in June. Wound culture grew Pseudomonas and he did receive a course of antibiotics. Repeat Echo  was done. This showed an EF of 40-45% with global HK. Mechanical MV prosthesis was functioning normally. There was a mobile density noted on the LV side of the MV which could be a retained chord but could not rule out vegetation. Seen today to discuss results.   He also is followed by Darvin Neighbours with Urology. Last month had significant increase in  PSA. Prescribed Cipro for this but developed a rash on his torso and this was stopped. Given Keflex but itching worsened and this was stopped also. Altogether had 4 days of therapy.   He has no fever or chills. No rashes. No dyspnea or chest pain. Appetite has decreased but he is still eating Ok. His LE wound has healed completely.   Current Outpatient Medications on File Prior to Visit  Medication Sig Dispense Refill   acetaminophen (TYLENOL) 325 MG tablet Take 650 mg by mouth every 6 (six) hours as needed for moderate pain or headache.     empagliflozin (JARDIANCE) 10 MG TABS tablet Take 1 tablet (10 mg total) by mouth daily before breakfast. 30 tablet 1   escitalopram (LEXAPRO) 20 MG tablet Take 20 mg by mouth in the morning.     LORazepam (ATIVAN) 0.5 MG tablet Take 0.5 mg by mouth every 6 (six) hours as needed for anxiety.     Multiple Vitamin (MULTIVITAMIN WITH MINERALS) TABS tablet Take 1 tablet by mouth in the morning.     tamsulosin (FLOMAX) 0.4 MG CAPS capsule Take 0.4 mg by mouth at bedtime.     warfarin (COUMADIN) 3 MG tablet TAKE 1/2 - 1 TABLET DAILY OR AS DIRECTED BY COUMADIN CLINIC (Patient taking differently: Take 3 mg by mouth every other day. In the evening.) 90 tablet 0   No current facility-administered medications on file prior to visit.    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   History of skin cancer     Hypertension    Major depressive disorder    Mild neurocognitive disorder, 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   Mitral insufficiency    Mobitz type 1 second degree atrioventricular block    Palpitations    Pseudoaneurysm    of forehead   Pure hypercholesterolemia 10/07/2010   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   Stroke 06/09/2018   Brain MRI revealed two remote tiny infarcts in the left cerebellum   Vitamin D deficiency 10/07/2010    Past Surgical History:  Procedure Laterality Date   CATARACT EXTRACTION, BILATERAL     CHOLECYSTECTOMY     COLON SURGERY     with diversion and resection   FALSE ANEURYSM REPAIR N/A 09/23/2019   Procedure: REPAIR FALSE ANEURYSM FOREHEAD;  Surgeon: Sherren Kerns, MD;  Location: [facility] OR;  Service: Vascular;  Laterality: N/A;   I & D EXTREMITY Left 01/28/2023   Procedure: LEFT LEG DEBRIDEMENT;  Surgeon: Nadara Mustard, MD;  Location: [facility] OR;  Service: Orthopedics;  Laterality: Left;   MITRAL VALVE REPLACEMENT  2002   with a st.jude mechanical valve   PROSTATE BIOPSY     TRIGGER FINGER RELEASE Left 06/04/2021   Procedure: RELEASE TRIGGER FINGER/A-1 PULLEY LEFT RING FINGER;  Surgeon: Cindee Salt, MD;  Location: [facility];  Service: Orthopedics;  Laterality: Left;    Social History   Tobacco Use  Smoking Status Former   Current packs/day: 0.00   Average packs/day: 0.8 packs/day for 18.0 years (14.4 ttl pk-yrs)   Types: Cigarettes   Start date: 08/04/1961   Quit date: 08/05/1979   Years since quitting: 43.8  Smokeless Tobacco Never    Social History   Substance and Sexual Activity  Alcohol Use Not Currently   Comment: occasional glass of wine    Family History  Problem Relation Age of Onset   Heart failure Mother    Hypertension Mother    Lung cancer Sister     Review of Systems: As noted in history of present illness.  All other systems were reviewed and are negative.  Physical Exam: BP (!) 124/56 (BP Location: Right Arm,  Patient Position: Sitting, Cuff Size: Normal)   Pulse 74   Ht 5\' 6"  (1.676 m)   Wt 143 lb (64.9 kg)   BMI 23.08 kg/m  GENERAL:  Well appearing WM in NAD HEENT:  PERRL, EOMI, sclera are clear. Oropharynx is clear. NECK:  No jugular venous distention, carotid upstroke brisk and symmetric, no bruits, no thyromegaly or adenopathy LUNGS:  Clear to auscultation bilaterally CHEST:  Unremarkable HEART:  RRR,  PMI not displaced or sustained, good mechanical MV click.  S2 within normal limits, no S3, no S4: no murmurs ABD:  Soft, nontender. BS +, no masses or bruits. No hepatomegaly, no splenomegaly EXT:  2 + pulses throughout, no edema, no cyanosis no clubbing SKIN:  Warm and dry.  No rashes NEURO:  Alert and oriented x 3. Cranial nerves II through XII intact. PSYCH:  Cognitively intact      LABORATORY DATA: Lab Results  Component Value Date   WBC 5.9 01/31/2023   HGB 11.7 (L) 01/31/2023   HCT 36.2 (L) 01/31/2023   PLT 258 01/31/2023   GLUCOSE 99 01/31/2023   NA 143 01/31/2023   K 4.1 01/31/2023   CL 102 01/31/2023   CREATININE 0.90 01/31/2023   BUN 25 (H) 01/31/2023   CO2 36 (H) 01/31/2023   INR 2.0 (H) 01/31/2023    Labs reviewed from primary care dated 07/12/15: cholesterol 166, triglycerides 87, LDL 92, HDL 57. From October 30/17:  Glucose 106.  Chemistries, CBC normal.  Dated 05/27/17: Normal CBC, CMET, TSH.  Dated 05/19/18: cholesterol 141, triglycerides 77, HDL 63, LDL 63. PSA 10.9.  Dated 05/28/18: CMET and TSH normal. Dated 04/05/19: Hgb 12.8. CMET normal. TSH normal. Cholesterol 195, triglycerides 128, HDL 51, LDL 121.  Dated 11/05/20: normal UA, CBC, BMET.  Dated 12/06/20: INR 3.8.   Echo 02/13/20: IMPRESSIONS     1. Left ventricular ejection fraction, by estimation, is 50 to 55%. The  left ventricle has low normal function. The left ventricle demonstrates  global hypokinesis. There is moderate concentric left ventricular  hypertrophy. Left ventricular diastolic   function could not be evaluated.   2. Right ventricular systolic function is normal. The right ventricular  size is normal. There is normal pulmonary artery systolic pressure.   3. Left atrial size was moderately dilated.   4. The mitral valve has been repaired/replaced. No evidence of mitral  valve regurgitation. No evidence of mitral stenosis. The mean mitral valve  gradient is 5.0 mmHg. There is a mechanical valve present in the mitral  position. Echo findings are  consistent with normal structure and function of the mitral valve  prosthesis.   5. The aortic valve is normal in structure. Aortic valve regurgitation is  mild. No aortic stenosis is present.   6. The inferior vena cava is normal in size with greater than 50%  respiratory variability, suggesting right atrial pressure of 3 mmHg.   Echo 04/14/23: IMPRESSIONS     1. Left ventricular ejection fraction, by estimation, is 40 to 45%. The  left ventricle has mildly decreased function. The left ventricle  demonstrates global hypokinesis. There is mild asymmetric left ventricular  hypertrophy of the basal-septal segment.  Left ventricular diastolic parameters are indeterminate.   2. Right ventricular systolic function is normal. The right ventricular  size is normal. There is normal pulmonary artery systolic pressure.   3. Left atrial size was severely dilated.   4. The mitral valve has been repaired/replaced. Trivial mitral valve  regurgitation. The mean mitral valve gradient is 5.0 mmHg with average  heart rate of 63 bpm. There is a mechanical valve present in the mitral  position. Procedure Date: 2002.   5. The aortic valve is tricuspid. Aortic valve regurgitation is mild. No  aortic stenosis is present.   6. Aortic dilatation noted. There is mild dilatation of the ascending  aorta, measuring 39 mm.   7. The inferior vena cava is normal in size with greater than 50%  respiratory variability, suggesting right atrial  pressure of 3 mmHg.   8. Mobile echodensity on ventricular side of mechanical mitral valve,  best seen image 49. Could represent chord but not seen on prior echo in  2021, vegetation on differential. Would recommend checking  blood cultures    Assessment / Plan: 1. Status post mechanical mitral valve replacement in 2002. Good valve click on exam. Echo in Sept 10 showed normal valve function. There is a mobile mass anteriorly on the valve. Concern for possible vegetation. Could be a retained MV chord but I do not see this structure on  his prior Echo in 2021. He did have a leg wound with would culturing Pseudomonas and he did receive 2-3 weeks of Keflex. No clinical stigmata of SBE. Temp is normal. No splinter hemorrhages or Janeway lesions. No murmur. We will check blood cultures x 2, sed rate, CRP and CBC. If negative will just monitor.  Patient is therapeutic on his Coumadin. We will continue with his current therapy. SBE prophylaxis.   2. Chronic anticoagulation. Adjusted per primary care.   3. History of Mobitz type 1 second degree AV block.  Resolved with reduction in beta blocker dose. On Inderal now and HR is OK  4. Memory loss.     Follow up in 3 months

## 2023-05-15 ENCOUNTER — Ambulatory Visit: Payer: Medicare Other | Attending: Cardiology | Admitting: Cardiology

## 2023-05-15 ENCOUNTER — Encounter: Payer: Self-pay | Admitting: Cardiology

## 2023-05-15 VITALS — BP 124/56 | HR 74 | Temp 98.0°F | Ht 66.0 in | Wt 143.0 lb

## 2023-05-15 DIAGNOSIS — Z952 Presence of prosthetic heart valve: Secondary | ICD-10-CM

## 2023-05-15 DIAGNOSIS — I951 Orthostatic hypotension: Secondary | ICD-10-CM | POA: Diagnosis not present

## 2023-05-15 DIAGNOSIS — Z9229 Personal history of other drug therapy: Secondary | ICD-10-CM | POA: Diagnosis not present

## 2023-05-15 NOTE — Patient Instructions (Signed)
Medication Instructions:  Continue medications *If you need a refill on your cardiac medications before your next appointment, please call your pharmacy*   Lab Work: Blood culture Cbc Bmet Sed rate crp If you have labs (blood work) drawn today and your tests are completely normal, you will receive your results only by: MyChart Message (if you have MyChart) OR A paper copy in the mail If you have any lab test that is abnormal or we need to change your treatment, we will call you to review the results.   Testing/Procedures: None    Follow-Up: At [facility], you and your health needs are our priority.  As part of our continuing mission to provide you with exceptional heart care, we have created designated Provider Care Teams.  These Care Teams include your primary Cardiologist (physician) and Advanced Practice Providers (APPs -  Physician Assistants and Nurse Practitioners) who all work together to provide you with the care you need, when you need it.  We recommend signing up for the patient portal called "MyChart".  Sign up information is provided on this After Visit Summary.  MyChart is used to connect with patients for Virtual Visits (Telemedicine).  Patients are able to view lab/test results, encounter notes, upcoming appointments, etc.  Non-urgent messages can be sent to your provider as well.   To learn more about what you can do with MyChart, go to [email or web address].    Your next appointment:   Follow up 3 months   Provider:   [place]

## 2023-05-16 LAB — BASIC METABOLIC PANEL
BUN/Creatinine Ratio: 19 (ref 10–24)
BUN: 21 mg/dL (ref 8–27)
CO2: 25 mmol/L (ref 20–29)
Calcium: 9.1 mg/dL (ref 8.6–10.2)
Chloride: 104 mmol/L (ref 96–106)
Creatinine, Ser: 1.08 mg/dL (ref 0.76–1.27)
Glucose: 135 mg/dL — ABNORMAL HIGH (ref 70–99)
Potassium: 4.2 mmol/L (ref 3.5–5.2)
Sodium: 143 mmol/L (ref 134–144)
eGFR: 68 mL/min/{1.73_m2} (ref >59)

## 2023-05-16 LAB — CBC
Hematocrit: 42.8 % (ref 37.5–51.0)
Hemoglobin: 13.2 g/dL (ref 13.0–17.7)
MCH: 27 pg (ref 26.6–33.0)
MCHC: 30.8 g/dL — ABNORMAL LOW (ref 31.5–35.7)
MCV: 88 fL (ref 79–97)
Platelets: 248 10*3/uL (ref 150–450)
RBC: 4.88 x10E6/uL (ref 4.14–5.80)
RDW: 14 % (ref 11.6–15.4)
WBC: 4 10*3/uL (ref 3.4–10.8)

## 2023-05-16 LAB — SEDIMENTATION RATE: Sed Rate: 5 mm/h (ref 0–30)

## 2023-05-16 LAB — C-REACTIVE PROTEIN: CRP: 2 mg/L (ref 0–10)

## 2023-05-18 ENCOUNTER — Other Ambulatory Visit: Payer: Self-pay | Admitting: *Deleted

## 2023-05-18 DIAGNOSIS — R209 Unspecified disturbances of skin sensation: Secondary | ICD-10-CM

## 2023-05-21 LAB — CULTURE, BLOOD (SINGLE)

## 2023-05-23 ENCOUNTER — Emergency Department (HOSPITAL_BASED_OUTPATIENT_CLINIC_OR_DEPARTMENT_OTHER)
Admission: EM | Admit: 2023-05-23 | Discharge: 2023-05-23 | Discharge status: Against Medical Advice | Payer: Medicare Other | Attending: Emergency Medicine | Admitting: Emergency Medicine

## 2023-05-23 ENCOUNTER — Emergency Department (HOSPITAL_COMMUNITY): Payer: Medicare Other

## 2023-05-23 ENCOUNTER — Other Ambulatory Visit: Payer: Self-pay

## 2023-05-23 ENCOUNTER — Other Ambulatory Visit (HOSPITAL_COMMUNITY): Payer: Medicare Other

## 2023-05-23 ENCOUNTER — Encounter (HOSPITAL_BASED_OUTPATIENT_CLINIC_OR_DEPARTMENT_OTHER): Payer: Self-pay

## 2023-05-23 ENCOUNTER — Emergency Department (HOSPITAL_BASED_OUTPATIENT_CLINIC_OR_DEPARTMENT_OTHER): Payer: Medicare Other

## 2023-05-23 ENCOUNTER — Emergency Department (HOSPITAL_BASED_OUTPATIENT_CLINIC_OR_DEPARTMENT_OTHER): Payer: Medicare Other | Admitting: Radiology

## 2023-05-23 DIAGNOSIS — Z8673 Personal history of transient ischemic attack (TIA), and cerebral infarction without residual deficits: Secondary | ICD-10-CM | POA: Diagnosis not present

## 2023-05-23 DIAGNOSIS — Z5329 Procedure and treatment not carried out because of patient's decision for other reasons: Secondary | ICD-10-CM | POA: Diagnosis not present

## 2023-05-23 DIAGNOSIS — N400 Enlarged prostate without lower urinary tract symptoms: Secondary | ICD-10-CM | POA: Insufficient documentation

## 2023-05-23 DIAGNOSIS — I10 Essential (primary) hypertension: Secondary | ICD-10-CM | POA: Diagnosis not present

## 2023-05-23 DIAGNOSIS — R419 Unspecified symptoms and signs involving cognitive functions and awareness: Secondary | ICD-10-CM | POA: Diagnosis not present

## 2023-05-23 DIAGNOSIS — R9431 Abnormal electrocardiogram [ECG] [EKG]: Secondary | ICD-10-CM | POA: Diagnosis not present

## 2023-05-23 DIAGNOSIS — E7800 Pure hypercholesterolemia, unspecified: Secondary | ICD-10-CM | POA: Insufficient documentation

## 2023-05-23 DIAGNOSIS — Z952 Presence of prosthetic heart valve: Secondary | ICD-10-CM | POA: Diagnosis not present

## 2023-05-23 DIAGNOSIS — I4891 Unspecified atrial fibrillation: Secondary | ICD-10-CM | POA: Insufficient documentation

## 2023-05-23 DIAGNOSIS — Z7901 Long term (current) use of anticoagulants: Secondary | ICD-10-CM | POA: Diagnosis not present

## 2023-05-23 DIAGNOSIS — R29818 Other symptoms and signs involving the nervous system: Secondary | ICD-10-CM | POA: Insufficient documentation

## 2023-05-23 HISTORY — DX: Other amnesia: R41.3

## 2023-05-23 LAB — COMPREHENSIVE METABOLIC PANEL
ALT: 7 U/L (ref 0–44)
AST: 14 U/L — ABNORMAL LOW (ref 15–41)
Albumin: 4.3 g/dL (ref 3.5–5.0)
Alkaline Phosphatase: 58 U/L (ref 38–126)
Anion gap: 6 (ref 5–15)
BUN: 19 mg/dL (ref 8–23)
CO2: 29 mmol/L (ref 22–32)
Calcium: 9.7 mg/dL (ref 8.9–10.3)
Chloride: 107 mmol/L (ref 98–111)
Creatinine, Ser: 1.03 mg/dL (ref 0.61–1.24)
GFR, Estimated: >60 mL/min (ref >60)
Glucose, Bld: 101 mg/dL — ABNORMAL HIGH (ref 70–99)
Potassium: 4 mmol/L (ref 3.5–5.1)
Sodium: 142 mmol/L (ref 135–145)
Total Bilirubin: 0.8 mg/dL (ref 0.3–1.2)
Total Protein: 6.4 g/dL — ABNORMAL LOW (ref 6.5–8.1)

## 2023-05-23 LAB — CBC WITH DIFFERENTIAL/PLATELET
Abs Immature Granulocytes: 0.02 10*3/uL (ref 0.00–0.07)
Basophils Absolute: 0 10*3/uL (ref 0.0–0.1)
Basophils Relative: 1 %
Eosinophils Absolute: 0 10*3/uL (ref 0.0–0.5)
Eosinophils Relative: 1 %
HCT: 40 % (ref 39.0–52.0)
Hemoglobin: 13.1 g/dL (ref 13.0–17.0)
Immature Granulocytes: 0 %
Lymphocytes Relative: 14 %
Lymphs Abs: 0.8 10*3/uL (ref 0.7–4.0)
MCH: 28.2 pg (ref 26.0–34.0)
MCHC: 32.8 g/dL (ref 30.0–36.0)
MCV: 86.2 fL (ref 80.0–100.0)
Monocytes Absolute: 0.3 10*3/uL (ref 0.1–1.0)
Monocytes Relative: 5 %
Neutro Abs: 4.8 10*3/uL (ref 1.7–7.7)
Neutrophils Relative %: 79 %
Platelets: 192 10*3/uL (ref 150–400)
RBC: 4.64 MIL/uL (ref 4.22–5.81)
RDW: 14.9 % (ref 11.5–15.5)
WBC: 6 10*3/uL (ref 4.0–10.5)
nRBC: 0 % (ref 0.0–0.2)

## 2023-05-23 LAB — TROPONIN I (HIGH SENSITIVITY): Troponin I (High Sensitivity): 12 ng/L (ref <18)

## 2023-05-23 LAB — PROTIME-INR
INR: 3.6 — ABNORMAL HIGH (ref 0.8–1.2)
Prothrombin Time: 36.4 s — ABNORMAL HIGH (ref 11.4–15.2)

## 2023-05-23 LAB — CBG MONITORING, ED: Glucose-Capillary: 77 mg/dL (ref 70–99)

## 2023-05-23 LAB — VITAMIN B12: Vitamin B-12: 374 pg/mL (ref 180–914)

## 2023-05-23 MED ORDER — GADOBUTROL 1 MMOL/ML IV SOLN
6.5000 mL | Freq: Once | INTRAVENOUS | Status: AC | PRN
  Administered 2023-05-23: 6.5 mL via INTRAVENOUS

## 2023-05-23 MED ORDER — LORAZEPAM 2 MG/ML IJ SOLN
1.0000 mg | Freq: Once | INTRAMUSCULAR | Status: AC
  Administered 2023-05-23: 1 mg via INTRAVENOUS
  Filled 2023-05-23: qty 1

## 2023-05-23 NOTE — ED Notes (Signed)
Pt is currently in MRI, family waiting at bedside

## 2023-05-23 NOTE — ED Provider Notes (Signed)
[place] EMERGENCY DEPARTMENT AT [facility] Provider Note   CSN: [id number] Arrival date & time: 05/23/23  1337     History  Chief Complaint  Patient presents with   Numbness    Nathan Mcdaniel is a 82 y.o. male.  HPI     82 year old male with history of mild neurocognitive disorder, hypertension, CVA, hyperlipidemia, atrial fibrillation, mechanical mitral valve placed in 2002 on Coumadin, BPH, presents with concern for an electric shocklike sensation going down his spine when he flexes his neck.  He reports it began all of a sudden while he was standing in line at the bank.  He flexed his head down, and felt this electric shocklike sensation going down his spine into his arms and legs.  Reports he feels it only when he flexes his neck.  He does not have the sensation when he turns it from side-to-side.  He otherwise denies any neck pain other than when he flexes it.  Denies any other symptoms including other numbness, weakness, change in vision, new difficulty talking or walking.  His wife reports that he does have some gait disturbance at baseline.  Denies fevers, chills.  Wife reports he frequently complains of cold feet/poor circulation.  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 Authorizing Provider  acetaminophen (TYLENOL) 325 MG tablet Take 650 mg by mouth every 6 (six) hours as needed for moderate pain or headache.    [provider]  empagliflozin (JARDIANCE) 10 MG TABS tablet Take 1 tablet (10 mg total) by mouth daily before breakfast. 05/04/23   Daine Floras, Gavin Pound, NP  escitalopram (LEXAPRO) 20 MG tablet Take 20 mg by mouth in the morning.    [provider]  LORazepam (ATIVAN) 0.5 MG tablet Take 0.5 mg by mouth every 6 (six) hours as needed for anxiety.    [provider]  Multiple Vitamin (MULTIVITAMIN WITH MINERALS) TABS tablet Take 1 tablet by mouth in the morning.    [provider]  tamsulosin (FLOMAX) 0.4 MG CAPS capsule Take 0.4 mg by mouth at bedtime.    [provider]  warfarin (COUMADIN) 3 MG tablet TAKE 1/2 - 1 TABLET DAILY OR AS DIRECTED BY COUMADIN CLINIC Patient taking differently: Take 3 mg by mouth every other day. In the evening. 09/21/17   [place], Peter M, MD      Allergies    Aricept Palma Holter hcl] and Teresa Coombs hcl]    Review of  Systems   Review of Systems  Physical Exam Updated Vital Signs BP (!) 175/108 (BP Location: Left Arm)   Pulse 76   Temp (!) 97.1 F (36.2 C)   Resp (!) 27   Ht 5\' 6"  (1.676 m)   Wt 64.4 kg   SpO2 100%   BMI 22.92 kg/m  Physical Exam Vitals and nursing note reviewed.  Constitutional:      General: He is not in acute distress.    Appearance: Normal appearance. He is not ill-appearing, toxic-appearing or diaphoretic.  HENT:     Head: Normocephalic and atraumatic.  Eyes:     General: No visual field deficit.    Extraocular Movements: Extraocular movements  intact.     Conjunctiva/sclera: Conjunctivae normal.     Pupils: Pupils are equal, round, and reactive to light.  Cardiovascular:     Rate and Rhythm: Normal rate and regular rhythm.     Pulses: Normal pulses.  Pulmonary:     Effort: Pulmonary effort is normal. No respiratory distress.  Musculoskeletal:        General: No swelling, tenderness, deformity or signs of injury.     Cervical back: Normal range of motion. No rigidity.  Skin:    General: Skin is warm and dry.     Coloration: Skin is not jaundiced or pale.     Findings: No erythema or rash.  Neurological:     General: No focal deficit present.     Mental Status: He is alert.     GCS: GCS eye subscore is 4. GCS verbal subscore is 5. GCS motor subscore is 6.     Cranial Nerves: No cranial nerve deficit, dysarthria or facial asymmetry.     Sensory: No sensory deficit.     Motor: No weakness or tremor.     Coordination: Coordination normal. Finger-Nose-Finger Test normal.     Gait: Gait normal.     ED Results / Procedures / Treatments   Labs (all labs ordered are listed, but only abnormal results are displayed) Labs Reviewed  PROTIME-INR - Abnormal; Notable for the following components:      Result Value   Prothrombin Time 36.4 (*)    INR 3.6 (*)    All other components within normal limits  CBC WITH DIFFERENTIAL/PLATELET  COMPREHENSIVE METABOLIC PANEL  VITAMIN B12  CBG MONITORING, ED  CBG MONITORING, ED  TROPONIN I (HIGH SENSITIVITY)    EKG EKG Interpretation Date/Time:  Saturday May 23 2023 14:04:12 EDT Ventricular Rate:  98 PR Interval:    QRS Duration:  106 QT Interval:  390 QTC Calculation: 497 R Axis:   10  Text Interpretation: Artifact Suspect sinus rhythm wiht frequent PVCs ST & T wave abnormality, consider inferior ischemia Prolonged QT Abnormal ECG When compared with ECG of 28-Jan-2023 10:10, Current undetermined rhythm precludes rhythm comparison, needs review ST now depressed in Anterior leads  Nonspecific T wave abnormality now evident in Lateral leads Confirmed by Alvira Monday ([id number]) on 05/23/2023 3:00:18 PM  Radiology No results found.  Procedures Procedures    Medications Ordered in ED Medications - No data to display  ED Course/ Medical Decision Making/ A&P              82 year old male with history of mild neurocognitive disorder, hypertension, CVA, hyperlipidemia, atrial fibrillation, mechanical mitral valve placed in 2002 on Coumadin, BPH, presents with concern for an electric shocklike sensation going down his spine when he flexes his neck.  DDx includes ACS, electrolyte abnormality,  intracranial hemorrhage, spontaneous epidural hemorrhage, disc herniation, other mass or spinal stenosis, inflammatory process, brainstem lesion.  He does not have fever, confusion, or headache and have low suspicion for meningitis.  At time of transfer care, labs including electrolytes, INR, B12 pending as well as imaging including CT head, cervical spine and chest x-ray.  Did discuss with Dr. Otelia Limes of neurology who recommends an MRI brain and cervical spine with and without contrast to evaluate for spinal or brainstem lesion. He does have a mechanical heart valve that was placed in 2002.  He was able to have MRI imaging with [place] at [facility] imaging on [place].  The MRI tech did confirm it is MRI conditional and can have MRI at [facility].   Care transferred with labs and imaging pending.         Final Clinical Impression(s) / ED Diagnoses Final diagnoses:  Positive Lhermitte's sign    Rx / DC Orders ED Discharge Orders     None         Alvira Monday, MD 05/23/23 [phone]

## 2023-05-23 NOTE — ED Triage Notes (Signed)
Pt presents with numbness and "electrical" sensations x 4 extremities that started while [place] the post office [place]. Pt appears very anxious with  tachypnea and has difficulty concentrating. Pt recently started on Jardiance 2 weeks prior.

## 2023-05-23 NOTE — ED Provider Notes (Signed)
  Physical Exam  BP (!) 150/90 (BP Location: Right Arm)   Pulse 78   Temp 98 F (36.7 C) (Oral)   Resp 18   Ht 5\' 6"  (1.676 m)   Wt 64.4 kg   SpO2 96%   BMI 22.92 kg/m   Physical Exam  Procedures  Procedures  ED Course / MDM    Medical Decision Making Amount and/or Complexity of Data Reviewed Labs: ordered. Radiology: ordered.  Risk Prescription drug management.   Received in signout.  Lightening like pain down CT scan done and does show some narrowing.  Plan for transfer to [facility] for MRI.  Dr. Dalene Seltzer had verified that MRI could be done with his valve.  Dr. Anitra Lauth is aware of transfer     Benjiman Core, MD 05/23/23 1733

## 2023-05-23 NOTE — ED Notes (Signed)
Jamie at [facility] will send transport for ED to [facility] ED transfer for MRI. Dr Anitra Lauth Accepted.-ABB(NS)

## 2023-05-23 NOTE — ED Provider Notes (Signed)
Patient initially seen by Dr. Dalene Seltzer at [facility] ED for concerns of numbness. Described the sensation as a shocklike sensation going down his spine when he flexes his neck. Transferred in to [facility] ED for MRI imaging for further evaluation after consult with Dr. Otelia Limes, neurology.  Physical Exam  BP (!) 154/87   Pulse 81   Temp 98.4 F (36.9 C) (Oral)   Resp 17   Ht 5\' 6"  (1.676 m)   Wt 64.4 kg   SpO2 98%   BMI 22.92 kg/m   Physical Exam Vitals and nursing note reviewed.  Constitutional:      General: He is not in acute distress.    Appearance: Normal appearance. He is not ill-appearing, toxic-appearing or diaphoretic.  Neurological:     Mental Status: He is alert.     Procedures  Procedures  ED Course / MDM    Medical Decision Making Amount and/or Complexity of Data Reviewed Labs: ordered. Radiology: ordered.  Risk Prescription drug management.   Patient initially seen by Dr. Dalene Seltzer at [facility] ED for concerns of numbness. Please see her note for full HPI and physical exam findings. Described the sensation as a shocklike sensation going down his spine when he flexes his neck. Transferred in to [facility] ED for MRI imaging for further evaluation after consult with Dr. Otelia Limes, neurology.   Patient had MRI imaging performed around 2100. Requesting to leave AMA after waiting several hours for results with no results available. No acute decline in status while here in the ED. Cautioned patient against leaving AMA but would still like to proceed with AMA paperwork. Patient in the care of daughter and spouse at this time.        Smitty Knudsen, PA-C 05/23/23 2324    Virgina Norfolk, DO 05/24/23 [phone]

## 2023-05-23 NOTE — ED Notes (Signed)
[place] [facility] [place]

## 2023-05-23 NOTE — ED Notes (Signed)
Patient back from CT. Ambulated to restroom and back to room.

## 2023-05-23 NOTE — Discharge Instructions (Addendum)
You were seen in the ER today for concerns of numbness. Your MRI results are still pending at this time. If there is a critical finding on the MRI, you will receive a call regarding this.

## 2023-05-27 NOTE — Progress Notes (Signed)
Patient ID: Nathan Mcdaniel, male   DOB: 09-12-1940, 82 y.o.   MRN: [id number]  Reason for Consult: New Patient (Initial Visit)   Referred by Beam, Chales Salmon, MD  Subjective:     HPI  Nathan Mcdaniel is a 82 y.o. male with A-fib on Coumadin, HTN, HLD presenting for evaluation of bilateral cold feet.  He reports some numbness and tingling when walking but really his main concern is subjective coldness, denies pain.  He denies claudication.  He denies rest pain, he denies nonhealing wounds.  He had a recent surgery in the summer with Dr. Lajoyce Corners on his left ankle and the incision has healed. He is a former smoker and quit in the 80s.  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   History of skin cancer    Hypertension    Major depressive disorder    Memory disorder    Mild neurocognitive disorder, 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   Mitral insufficiency    Mobitz type 1 second degree atrioventricular block    Palpitations    Pseudoaneurysm    of forehead   Pure hypercholesterolemia 10/07/2010   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   Stroke 06/09/2018   Brain MRI revealed two remote tiny infarcts in the left cerebellum   Vitamin D deficiency 10/07/2010   Family History  Problem Relation Age of Onset   Heart failure Mother    Hypertension Mother    Lung cancer Sister    Past Surgical History:  Procedure Laterality Date   CATARACT EXTRACTION, BILATERAL     CHOLECYSTECTOMY     COLON SURGERY     with diversion and resection   FALSE ANEURYSM REPAIR N/A 09/23/2019   Procedure: REPAIR FALSE ANEURYSM  FOREHEAD;  Surgeon: Sherren Kerns, MD;  Location: [facility] OR;  Service: Vascular;  Laterality: N/A;   I & D EXTREMITY Left 01/28/2023   Procedure: LEFT LEG DEBRIDEMENT;  Surgeon: Nadara Mustard, MD;  Location: [facility] OR;  Service: Orthopedics;  Laterality: Left;   MITRAL VALVE REPLACEMENT  2002   with a [facility] mechanical valve   PROSTATE BIOPSY     TRIGGER FINGER RELEASE Left 06/04/2021   Procedure: RELEASE TRIGGER FINGER/A-1 PULLEY LEFT RING FINGER;  Surgeon: Cindee Salt, MD;  Location:  SURGERY CENTER;  Service: Orthopedics;  Laterality: Left;    Short Social History:  Social History   Tobacco Use   Smoking status: Former    Current packs/day: 0.00    Average packs/day: 0.8 packs/day for 18.0 years (14.4 ttl pk-yrs)    Types: Cigarettes    Start date: 08/04/1961    Quit date: 08/05/1979    Years since quitting: 43.8   Smokeless tobacco: Never  Substance Use Topics   Alcohol use: Not Currently    Comment: occasional glass of wine    Allergies  Allergen Reactions   Aricept [Donepezil Hcl] Other (See Comments)    Hallucinations    Namenda [Memantine Hcl] Other (See Comments)    Hallucinations     Current Outpatient Medications  Medication Sig Dispense Refill   acetaminophen (TYLENOL)  325 MG tablet Take 650 mg by mouth every 6 (six) hours as needed for moderate pain or headache.     empagliflozin (JARDIANCE) 10 MG TABS tablet Take 1 tablet (10 mg total) by mouth daily before breakfast. 30 tablet 1   escitalopram (LEXAPRO) 20 MG tablet Take 20 mg by mouth in the morning.     LORazepam (ATIVAN) 0.5 MG tablet Take 0.5 mg by mouth every 6 (six) hours as needed for anxiety.     Multiple Vitamin (MULTIVITAMIN WITH MINERALS) TABS tablet Take 1 tablet by mouth in the morning.     tamsulosin (FLOMAX) 0.4 MG CAPS capsule Take 0.4 mg by mouth at bedtime.     warfarin (COUMADIN) 3 MG tablet TAKE 1/2 - 1 TABLET DAILY OR AS DIRECTED BY COUMADIN CLINIC (Patient taking differently: Take 3 mg  by mouth every other day. In the evening.) 90 tablet 0   No current facility-administered medications for this visit.    REVIEW OF SYSTEMS  Negative other than noted in HPI     Objective:  Objective   Vitals:   05/29/23 1438  BP: 135/66  Pulse: (!) 55  Resp: 20  Temp: 98.2 F (36.8 C)  SpO2: 98%  Weight: 143 lb (64.9 kg)  Height: 5\' 6"  (1.676 m)   Body mass index is 23.08 kg/m.  Physical Exam General: no acute distress Cardiac: hemodynamically stable, nontachycardic Pulm: normal work of breathing Neuro: alert, no focal deficit Extremities: Mild edema bilaterally, ropey varicosities noted in bilateral shins and ankles.  Cyanosis or wounds. Vascular:   Right: Palpable femoral, DP  Left: Palpable femoral, DP   Data: ABI: +---------+------------------+-----+---------+--------+  Right   Rt Pressure (mmHg)IndexWaveform Comment   +---------+------------------+-----+---------+--------+  Brachial 154                                       +---------+------------------+-----+---------+--------+  PTA     255               1.65 triphasic          +---------+------------------+-----+---------+--------+  DP      255               1.65 triphasic          +---------+------------------+-----+---------+--------+  Great Toe136               0.88                    +---------+------------------+-----+---------+--------+   +---------+------------------+-----+---------+-------+  Left    Lt Pressure (mmHg)IndexWaveform Comment  +---------+------------------+-----+---------+-------+  Brachial 155                                      +---------+------------------+-----+---------+-------+  PTA     255               1.65 triphasic         +---------+------------------+-----+---------+-------+  DP      255               1.65 triphasic         +---------+------------------+-----+---------+-------+  Great Toe161               1.04                    +---------+------------------+-----+---------+-------+  Echo from 04/14/2023: EF 40 to 45% with global hypokinesis, normal RSVP, trivial MR, mild AR, mild ascending aorta dilation, 39 mm     Assessment/Plan:     Nathan Mcdaniel is a 82 y.o. male presenting for subjective coldness and numbness of bilateral feet.  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  Explained that there are many causes of neuropathy and it is difficult to determine what might be causing his. The wife mentioned she would like to trial gabapentin.   I explained that I can give her 1 month trial but she we will have to continue that the prescription with the PCP if this does work for him  Follow-up as needed      Daria Pastures MD [facility]

## 2023-05-29 ENCOUNTER — Ambulatory Visit (HOSPITAL_COMMUNITY)
Admission: RE | Admit: 2023-05-29 | Discharge: 2023-05-29 | Disposition: A | Discharge status: Home / Self Care | Payer: Medicare Other | Source: Ambulatory Visit | Attending: Vascular Surgery

## 2023-05-29 ENCOUNTER — Ambulatory Visit: Payer: Medicare Other | Admitting: Vascular Surgery

## 2023-05-29 ENCOUNTER — Encounter: Payer: Self-pay | Admitting: Vascular Surgery

## 2023-05-29 VITALS — BP 135/66 | HR 55 | Temp 98.2°F | Resp 20 | Ht 66.0 in | Wt 143.0 lb

## 2023-05-29 DIAGNOSIS — I5020 Unspecified systolic (congestive) heart failure: Secondary | ICD-10-CM | POA: Diagnosis not present

## 2023-05-29 DIAGNOSIS — I10 Essential (primary) hypertension: Secondary | ICD-10-CM | POA: Diagnosis not present

## 2023-05-29 DIAGNOSIS — E785 Hyperlipidemia, unspecified: Secondary | ICD-10-CM

## 2023-05-29 DIAGNOSIS — R209 Unspecified disturbances of skin sensation: Secondary | ICD-10-CM | POA: Insufficient documentation

## 2023-05-29 LAB — VAS US ABI WITH/WO TBI

## 2023-05-29 MED ORDER — GABAPENTIN 300 MG PO CAPS: 300.0000 mg | ORAL_CAPSULE | Freq: Every day | ORAL | 0 refills | Status: DC

## 2023-06-10 ENCOUNTER — Emergency Department (HOSPITAL_COMMUNITY): Admission: EM | Admit: 2023-06-10 | Payer: Medicare Other | Source: Home / Self Care

## 2023-06-10 NOTE — ED Triage Notes (Signed)
The pts doctor has called in and said that the pt does not need to be here in the ed he was told to come here from a [facility] who did his blood the pts doctor  dr beam at [facility] called the people and told them that the pt did not need to be here and to go home with other instructions

## 2023-06-10 NOTE — ED Triage Notes (Signed)
The pt is ready to go home

## 2023-06-23 ENCOUNTER — Encounter: Payer: Self-pay | Admitting: Cardiology

## 2023-06-23 ENCOUNTER — Emergency Department (HOSPITAL_COMMUNITY)
Admission: EM | Admit: 2023-06-23 | Discharge: 2023-06-23 | Discharge status: Against Medical Advice | Payer: No Typology Code available for payment source | Attending: Emergency Medicine | Admitting: Emergency Medicine

## 2023-06-23 DIAGNOSIS — F410 Panic disorder [episodic paroxysmal anxiety] without agoraphobia: Secondary | ICD-10-CM | POA: Diagnosis present

## 2023-06-23 DIAGNOSIS — Z5321 Procedure and treatment not carried out due to patient leaving prior to being seen by health care provider: Secondary | ICD-10-CM | POA: Insufficient documentation

## 2023-06-23 DIAGNOSIS — F0390 Unspecified dementia without behavioral disturbance: Secondary | ICD-10-CM | POA: Diagnosis not present

## 2023-06-23 DIAGNOSIS — R410 Disorientation, unspecified: Secondary | ICD-10-CM | POA: Diagnosis not present

## 2023-06-23 LAB — COMPREHENSIVE METABOLIC PANEL
ALT: 11 U/L (ref 0–44)
AST: 19 U/L (ref 15–41)
Albumin: 4.1 g/dL (ref 3.5–5.0)
Alkaline Phosphatase: 58 U/L (ref 38–126)
Anion gap: 8 (ref 5–15)
BUN: 18 mg/dL (ref 8–23)
CO2: 27 mmol/L (ref 22–32)
Calcium: 9.2 mg/dL (ref 8.9–10.3)
Chloride: 107 mmol/L (ref 98–111)
Creatinine, Ser: 1.24 mg/dL (ref 0.61–1.24)
GFR, Estimated: 57 mL/min — ABNORMAL LOW (ref >60)
Glucose, Bld: 103 mg/dL — ABNORMAL HIGH (ref 70–99)
Potassium: 4.1 mmol/L (ref 3.5–5.1)
Sodium: 142 mmol/L (ref 135–145)
Total Bilirubin: 0.9 mg/dL (ref <1.2)
Total Protein: 6.8 g/dL (ref 6.5–8.1)

## 2023-06-23 LAB — CBC
HCT: 43.1 % (ref 39.0–52.0)
Hemoglobin: 13.7 g/dL (ref 13.0–17.0)
MCH: 28.2 pg (ref 26.0–34.0)
MCHC: 31.8 g/dL (ref 30.0–36.0)
MCV: 88.9 fL (ref 80.0–100.0)
Platelets: 225 10*3/uL (ref 150–400)
RBC: 4.85 MIL/uL (ref 4.22–5.81)
RDW: 14.8 % (ref 11.5–15.5)
WBC: 5.1 10*3/uL (ref 4.0–10.5)
nRBC: 0 % (ref 0.0–0.2)

## 2023-06-23 LAB — ACETAMINOPHEN LEVEL: Acetaminophen (Tylenol), Serum: <10 ug/mL — ABNORMAL LOW (ref 10–30)

## 2023-06-23 LAB — SALICYLATE LEVEL: Salicylate Lvl: <7 mg/dL — ABNORMAL LOW (ref 7.0–30.0)

## 2023-06-23 LAB — ETHANOL: Alcohol, Ethyl (B): <10 mg/dL (ref <10)

## 2023-06-23 NOTE — ED Notes (Signed)
Pt and family stated that they have to leave and will seek medical assistance at another time. Pt seen leaving the ED

## 2023-06-23 NOTE — ED Triage Notes (Addendum)
Pt to ED POV from home. Pt reports having episodes of panic attacks x a few months. Pt's wife states he was seen by PCP this morning and was told to come to the ER to be worked up to r/o any infection that may be causing panic attacks. Pt denies any fevers at home. Pt denies any pain. Pt denies chills, N/V/D. Pt's wife states he had surgery on his left ankle back in June, but denies any issues with ankle since the panic attacks began. Pt's wife reports last panic attack last night and states pt was verbally upset. Pt has hx of dementia and is confused at baseline. Pt's wife does report pt has been having poor quality of sleep. Pt denies any SI / HI.

## 2023-06-27 ENCOUNTER — Emergency Department (HOSPITAL_COMMUNITY): Payer: No Typology Code available for payment source

## 2023-06-27 ENCOUNTER — Encounter (HOSPITAL_COMMUNITY): Payer: Self-pay | Admitting: Emergency Medicine

## 2023-06-27 ENCOUNTER — Inpatient Hospital Stay (HOSPITAL_COMMUNITY)
Admission: EM | Admit: 2023-06-27 | Discharge: 2023-07-10 | DRG: 813 | Disposition: A | Discharge status: Hospice | Payer: No Typology Code available for payment source | Attending: Internal Medicine | Admitting: Internal Medicine

## 2023-06-27 ENCOUNTER — Other Ambulatory Visit: Payer: Self-pay

## 2023-06-27 DIAGNOSIS — I482 Chronic atrial fibrillation, unspecified: Secondary | ICD-10-CM | POA: Diagnosis present

## 2023-06-27 DIAGNOSIS — Z8249 Family history of ischemic heart disease and other diseases of the circulatory system: Secondary | ICD-10-CM | POA: Diagnosis not present

## 2023-06-27 DIAGNOSIS — F01B2 Vascular dementia, moderate, with psychotic disturbance: Secondary | ICD-10-CM | POA: Diagnosis present

## 2023-06-27 DIAGNOSIS — N401 Enlarged prostate with lower urinary tract symptoms: Secondary | ICD-10-CM | POA: Diagnosis present

## 2023-06-27 DIAGNOSIS — Z7984 Long term (current) use of oral hypoglycemic drugs: Secondary | ICD-10-CM | POA: Diagnosis not present

## 2023-06-27 DIAGNOSIS — Z801 Family history of malignant neoplasm of trachea, bronchus and lung: Secondary | ICD-10-CM

## 2023-06-27 DIAGNOSIS — F03911 Unspecified dementia, unspecified severity, with agitation: Secondary | ICD-10-CM | POA: Diagnosis not present

## 2023-06-27 DIAGNOSIS — Z7189 Other specified counseling: Secondary | ICD-10-CM

## 2023-06-27 DIAGNOSIS — Z515 Encounter for palliative care: Secondary | ICD-10-CM | POA: Diagnosis not present

## 2023-06-27 DIAGNOSIS — D62 Acute posthemorrhagic anemia: Secondary | ICD-10-CM | POA: Diagnosis present

## 2023-06-27 DIAGNOSIS — S3011XA Contusion of abdominal wall, initial encounter: Secondary | ICD-10-CM | POA: Diagnosis present

## 2023-06-27 DIAGNOSIS — Z87891 Personal history of nicotine dependence: Secondary | ICD-10-CM | POA: Diagnosis not present

## 2023-06-27 DIAGNOSIS — F03B11 Unspecified dementia, moderate, with agitation: Principal | ICD-10-CM

## 2023-06-27 DIAGNOSIS — E7800 Pure hypercholesterolemia, unspecified: Secondary | ICD-10-CM | POA: Diagnosis present

## 2023-06-27 DIAGNOSIS — D6832 Hemorrhagic disorder due to extrinsic circulating anticoagulants: Principal | ICD-10-CM | POA: Diagnosis present

## 2023-06-27 DIAGNOSIS — Z66 Do not resuscitate: Secondary | ICD-10-CM | POA: Diagnosis present

## 2023-06-27 DIAGNOSIS — Z85828 Personal history of other malignant neoplasm of skin: Secondary | ICD-10-CM | POA: Diagnosis not present

## 2023-06-27 DIAGNOSIS — F01B18 Vascular dementia, moderate, with other behavioral disturbance: Secondary | ICD-10-CM | POA: Diagnosis present

## 2023-06-27 DIAGNOSIS — Z79899 Other long term (current) drug therapy: Secondary | ICD-10-CM | POA: Diagnosis not present

## 2023-06-27 DIAGNOSIS — M7981 Nontraumatic hematoma of soft tissue: Principal | ICD-10-CM | POA: Diagnosis present

## 2023-06-27 DIAGNOSIS — T45515A Adverse effect of anticoagulants, initial encounter: Secondary | ICD-10-CM | POA: Diagnosis present

## 2023-06-27 DIAGNOSIS — S301XXA Contusion of abdominal wall, initial encounter: Secondary | ICD-10-CM | POA: Diagnosis not present

## 2023-06-27 DIAGNOSIS — Z7901 Long term (current) use of anticoagulants: Secondary | ICD-10-CM | POA: Diagnosis not present

## 2023-06-27 DIAGNOSIS — R64 Cachexia: Secondary | ICD-10-CM | POA: Diagnosis present

## 2023-06-27 DIAGNOSIS — E119 Type 2 diabetes mellitus without complications: Secondary | ICD-10-CM | POA: Diagnosis present

## 2023-06-27 DIAGNOSIS — Z781 Physical restraint status: Secondary | ICD-10-CM | POA: Diagnosis not present

## 2023-06-27 DIAGNOSIS — F03918 Unspecified dementia, unspecified severity, with other behavioral disturbance: Secondary | ICD-10-CM | POA: Diagnosis not present

## 2023-06-27 DIAGNOSIS — I447 Left bundle-branch block, unspecified: Secondary | ICD-10-CM | POA: Diagnosis present

## 2023-06-27 DIAGNOSIS — R4589 Other symptoms and signs involving emotional state: Secondary | ICD-10-CM

## 2023-06-27 DIAGNOSIS — F01B11 Vascular dementia, moderate, with agitation: Secondary | ICD-10-CM | POA: Diagnosis present

## 2023-06-27 DIAGNOSIS — Z6825 Body mass index (BMI) 25.0-25.9, adult: Secondary | ICD-10-CM

## 2023-06-27 DIAGNOSIS — I340 Nonrheumatic mitral (valve) insufficiency: Secondary | ICD-10-CM | POA: Diagnosis present

## 2023-06-27 DIAGNOSIS — Z8673 Personal history of transient ischemic attack (TIA), and cerebral infarction without residual deficits: Secondary | ICD-10-CM

## 2023-06-27 DIAGNOSIS — I10 Essential (primary) hypertension: Secondary | ICD-10-CM | POA: Diagnosis present

## 2023-06-27 DIAGNOSIS — R791 Abnormal coagulation profile: Secondary | ICD-10-CM | POA: Diagnosis present

## 2023-06-27 DIAGNOSIS — F05 Delirium due to known physiological condition: Secondary | ICD-10-CM | POA: Diagnosis present

## 2023-06-27 DIAGNOSIS — G4700 Insomnia, unspecified: Secondary | ICD-10-CM | POA: Diagnosis present

## 2023-06-27 DIAGNOSIS — Z9185 Personal history of military service: Secondary | ICD-10-CM

## 2023-06-27 DIAGNOSIS — R451 Restlessness and agitation: Secondary | ICD-10-CM | POA: Diagnosis present

## 2023-06-27 LAB — RAPID URINE DRUG SCREEN, HOSP PERFORMED
Amphetamines: NOT DETECTED
Barbiturates: NOT DETECTED
Benzodiazepines: NOT DETECTED
Cocaine: NOT DETECTED
Opiates: NOT DETECTED
Tetrahydrocannabinol: NOT DETECTED

## 2023-06-27 LAB — COMPREHENSIVE METABOLIC PANEL
ALT: 14 U/L (ref 0–44)
AST: 23 U/L (ref 15–41)
Albumin: 4.3 g/dL (ref 3.5–5.0)
Alkaline Phosphatase: 64 U/L (ref 38–126)
Anion gap: 9 (ref 5–15)
BUN: 21 mg/dL (ref 8–23)
CO2: 27 mmol/L (ref 22–32)
Calcium: 8.9 mg/dL (ref 8.9–10.3)
Chloride: 104 mmol/L (ref 98–111)
Creatinine, Ser: 1.02 mg/dL (ref 0.61–1.24)
GFR, Estimated: >60 mL/min (ref >60)
Glucose, Bld: 127 mg/dL — ABNORMAL HIGH (ref 70–99)
Potassium: 3.5 mmol/L (ref 3.5–5.1)
Sodium: 140 mmol/L (ref 135–145)
Total Bilirubin: 1.2 mg/dL — ABNORMAL HIGH (ref <1.2)
Total Protein: 7 g/dL (ref 6.5–8.1)

## 2023-06-27 LAB — CBC WITH DIFFERENTIAL/PLATELET
Abs Immature Granulocytes: 0.02 10*3/uL (ref 0.00–0.07)
Basophils Absolute: 0 10*3/uL (ref 0.0–0.1)
Basophils Relative: 1 %
Eosinophils Absolute: 0.1 10*3/uL (ref 0.0–0.5)
Eosinophils Relative: 1 %
HCT: 46.1 % (ref 39.0–52.0)
Hemoglobin: 14.4 g/dL (ref 13.0–17.0)
Immature Granulocytes: 0 %
Lymphocytes Relative: 14 %
Lymphs Abs: 0.9 10*3/uL (ref 0.7–4.0)
MCH: 28.1 pg (ref 26.0–34.0)
MCHC: 31.2 g/dL (ref 30.0–36.0)
MCV: 89.9 fL (ref 80.0–100.0)
Monocytes Absolute: 0.4 10*3/uL (ref 0.1–1.0)
Monocytes Relative: 6 %
Neutro Abs: 5 10*3/uL (ref 1.7–7.7)
Neutrophils Relative %: 78 %
Platelets: 246 10*3/uL (ref 150–400)
RBC: 5.13 MIL/uL (ref 4.22–5.81)
RDW: 14.7 % (ref 11.5–15.5)
WBC: 6.3 10*3/uL (ref 4.0–10.5)
nRBC: 0 % (ref 0.0–0.2)

## 2023-06-27 LAB — URINALYSIS, ROUTINE W REFLEX MICROSCOPIC
Bacteria, UA: NONE SEEN
Bilirubin Urine: NEGATIVE
Glucose, UA: 50 mg/dL — AB
Ketones, ur: 5 mg/dL — AB
Leukocytes,Ua: NEGATIVE
Nitrite: NEGATIVE
Protein, ur: NEGATIVE mg/dL
Specific Gravity, Urine: 1.016 (ref 1.005–1.030)
pH: 5 (ref 5.0–8.0)

## 2023-06-27 LAB — PROTIME-INR
INR: 3.1 — ABNORMAL HIGH (ref 0.8–1.2)
Prothrombin Time: 32.5 s — ABNORMAL HIGH (ref 11.4–15.2)

## 2023-06-27 LAB — CBG MONITORING, ED: Glucose-Capillary: 100 mg/dL — ABNORMAL HIGH (ref 70–99)

## 2023-06-27 LAB — ETHANOL: Alcohol, Ethyl (B): <10 mg/dL (ref <10)

## 2023-06-27 MED ORDER — WARFARIN SODIUM 3 MG PO TABS: 3.0000 mg | ORAL_TABLET | ORAL | Status: DC

## 2023-06-27 MED ORDER — LORAZEPAM 2 MG/ML IJ SOLN: 1.0000 mg | Freq: Once | INTRAMUSCULAR | Status: DC

## 2023-06-27 MED ORDER — WARFARIN - PHYSICIAN DOSING INPATIENT: Freq: Every day | Status: DC

## 2023-06-27 MED ORDER — ONDANSETRON HCL 4 MG PO TABS: 4.0000 mg | ORAL_TABLET | Freq: Three times a day (TID) | ORAL | Status: DC | PRN

## 2023-06-27 MED ORDER — ZIPRASIDONE MESYLATE 20 MG IM SOLR
20.0000 mg | INTRAMUSCULAR | Status: AC | PRN
  Administered 2023-06-27: 20 mg via INTRAMUSCULAR
  Filled 2023-06-27: qty 20

## 2023-06-27 MED ORDER — ALUM & MAG HYDROXIDE-SIMETH 200-200-20 MG/5ML PO SUSP: 30.0000 mL | Freq: Four times a day (QID) | ORAL | Status: DC | PRN

## 2023-06-27 MED ORDER — GABAPENTIN 300 MG PO CAPS
300.0000 mg | ORAL_CAPSULE | Freq: Every day | ORAL | Status: DC
  Administered 2023-06-28 – 2023-07-08 (×10): 300 mg via ORAL
  Filled 2023-06-27 (×10): qty 1

## 2023-06-27 MED ORDER — LORAZEPAM 2 MG/ML IJ SOLN
1.0000 mg | Freq: Once | INTRAMUSCULAR | Status: AC
  Administered 2023-06-27: 1 mg via INTRAVENOUS
  Filled 2023-06-27: qty 1

## 2023-06-27 MED ORDER — ACETAMINOPHEN 325 MG PO TABS
650.0000 mg | ORAL_TABLET | ORAL | Status: DC | PRN
  Administered 2023-06-28 – 2023-07-05 (×2): 650 mg via ORAL
  Filled 2023-06-27 (×2): qty 2

## 2023-06-27 MED ORDER — RISPERIDONE 0.5 MG PO TBDP
2.0000 mg | ORAL_TABLET | Freq: Three times a day (TID) | ORAL | Status: DC | PRN
  Administered 2023-06-27 – 2023-06-28 (×2): 2 mg via ORAL
  Filled 2023-06-27 (×3): qty 4

## 2023-06-27 MED ORDER — ESCITALOPRAM OXALATE 20 MG PO TABS
20.0000 mg | ORAL_TABLET | Freq: Every morning | ORAL | Status: DC
  Administered 2023-06-28 – 2023-07-08 (×10): 20 mg via ORAL
  Filled 2023-06-27 (×12): qty 2

## 2023-06-27 MED ORDER — LORAZEPAM 1 MG PO TABS
1.0000 mg | ORAL_TABLET | ORAL | Status: AC | PRN
  Administered 2023-06-27: 1 mg via ORAL
  Filled 2023-06-27: qty 1

## 2023-06-27 MED ORDER — WARFARIN SODIUM 2.5 MG PO TABS
2.5000 mg | ORAL_TABLET | Freq: Every day | ORAL | Status: DC
  Administered 2023-06-27 – 2023-06-30 (×4): 2.5 mg via ORAL
  Filled 2023-06-27 (×5): qty 1

## 2023-06-27 MED ORDER — ZOLPIDEM TARTRATE 5 MG PO TABS: 5.0000 mg | ORAL_TABLET | Freq: Every evening | ORAL | Status: DC | PRN

## 2023-06-27 MED ORDER — LORAZEPAM 0.5 MG PO TABS
0.5000 mg | ORAL_TABLET | Freq: Three times a day (TID) | ORAL | Status: DC | PRN
  Administered 2023-06-28 – 2023-07-09 (×15): 0.5 mg via ORAL
  Filled 2023-06-27 (×16): qty 1

## 2023-06-27 MED ORDER — EMPAGLIFLOZIN 10 MG PO TABS: 10.0000 mg | ORAL_TABLET | Freq: Every day | ORAL | Status: DC

## 2023-06-27 NOTE — ED Triage Notes (Signed)
PT BIB EMS c/o increased confusion. Has hx of Dementia. Per EMS. Pt had recent medication changes and this is when dementia started worsening. Ax0 x2, NAD,   BP 177/98, HR 71, 93% RA, CBG 130

## 2023-06-27 NOTE — ED Notes (Addendum)
7:11 PM  Report received from previous RN. This RN assumes care of the patient.   7:30 PM  Patient is alert and oriented x 1 to person. He denies any needs at this time. Given ginger ale per request and assisted to the restroom. He is ambulatory with an unsteady gait. He has equal rise and fall of the chest wall with clear lung sounds. Sitter present at bedside. Bed in lowest position and bed alarm on.  7:45 PM  Patient consistently and constantly getting OOB. Attempt to orient patient unsuccessful as patient continues to get OOB and attempts to walk out of room. Patient safely assisted back into bed and informed of need to stay in bed due to unsteady gait. Patient denies pain. He denies any needs when asked if any assistance can be given. Patient sitter at bedside.   8:05 PM  Patient continues to attempt to get OOB and will not listen to RN or sitter. Patient offered music and television turned on with music. Patient offered coloring books, which he states that he does not want "to color." Patient offered word search, which patient is unable to understand to complete. Patient given towels to fold, with patient does not find interest in doing. Patient unable to follow instructions and directions. Patient medicated per order.   9:09 PM  Patient consistently getting OOB despite reorientation and attempts to distract patient. Patient is confused and unable to be reoriented. Patient medicated per order.   10:27 PM  Patient continues to get OOB. Patient pulling on RN and sitter and voicing unintelligible remarks. Patient attempted to be oriented with no success. Patient medicated per order. ER physician informed that patient is continuing to try to get out of bed and has increased agitation and is combative despite attempts for distraction, reorientation, and medication. ER physician present at bedside.   11:05 PM  Patient sleeping with equal rise and fall of the chest wall. Patient in NAD.  Patient is attached to pulse ox and BP cuff. Sitter present at bedside. No needs at this time. Bed in lowest position.

## 2023-06-27 NOTE — Consult Note (Signed)
[facility] ED ASSESSMENT   Reason for Consult:  Psychiatry evaluation Referring Physician:  ER Physician Patient Identification: Nathan Mcdaniel MRN:  [id number] ED Chief Complaint: Dementia with behavioral disturbance [facility])  Diagnosis:  Principal Problem:   Dementia with behavioral disturbance [facility])   ED Assessment Time Calculation: Start Time: 1806 Stop Time: 1824 Total Time in Minutes (Assessment Completion): 18   Subjective:   Nathan Mcdaniel is a 82 y.o. male patient admitted with hx significant for Dementia was brought in by EMS from home.for increased episodes of confusion and insomnia   HPI:  Male, 82 years old was seen sitting in a recliner fidgeting with his blanket.  He did not make any eye contact or respond to question when he was spoken to.  Patient frequently attempts to get out of bed and his sitter continues to redirect him to stay in bed. Spouse, Nathan Mcdaniel reports that for years patient his Physician were telling her that patient was gradually experiencing neurocognitive disorder but after his Surgery in July and having Anaesthesia patient drastically declined.  She reports that she and her daughter have been managing patient.  Nathan Mcdaniel reports that patient does not want to stay in the house.  He is constantly wanting to leave the house.  She state that patient is not eating or sleeping.  She states that he fights and pulls when redirected.  She added that PCP placed patient on Olanzapine and that made him aggressive , no sleep and he did not eat.  Patient did not sleep for three days according to her.  Patient was placed on Donepezil and Namenda but both Medications caused intense Hallucination and he was taken off it.  Wife says he does well on Ativan and she gives it to him at night for sleep. We reviewed plan of care, patient need to move into a Memory care facility.  Spouse says she cannot take care of him at age 45 but provider reinforce the information given that ER is  not a place to keep patient until placement is finalized.  She was advised to contact the SW at his PCP office for assistance with finding a Memory care place.  She is in agreement.  Ativan 0.5 mg scheduled tid as needed.  We will monitor overnight and reevaluate in am.  Past Psychiatric History: Dementia  Risk to Self or Others: Is the patient at risk to self? No Has the patient been a risk to self in the past 6 months? No Has the patient been a risk to self within the distant past? No Is the patient a risk to others? No Has the patient been a risk to others in the past 6 months? No Has the patient been a risk to others within the distant past? No  [place] Scale:  Flowsheet Row ED from 06/27/2023 in [facility] Emergency Department at [facility] ED from 06/23/2023 in [facility] Emergency Department at [facility] ED from 05/23/2023 in [facility] Emergency Department at [facility]  C-SSRS RISK CATEGORY No Risk No Risk No Risk       AIMS:  , , ,  ,   ASAM:    Substance Abuse:     Past Medical History:  Past Medical History:  Diagnosis Date   Abnormal ECG    Allergic rhinitis 10/24/2009   Asymptomatic varicose veins 10/07/2010   Atrial fibrillation 10/14/2011   Dr Thomasene Lot follows and checks inr   Benign prostatic hyperplasia with  urinary obstruction 10/07/2010   Dementia (HCC)    Elevated prostate specific antigen (PSA) 11/26/2010   Generalized anxiety disorder    Headache    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   History of skin cancer    Hypertension    Major depressive disorder    Memory disorder    Mild neurocognitive disorder, 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   Mitral insufficiency    Mobitz type 1 second degree atrioventricular block    Palpitations    Pseudoaneurysm    of forehead   Pure hypercholesterolemia 10/07/2010   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   Stroke 06/09/2018   Brain MRI revealed two remote tiny infarcts  in the left cerebellum   Vitamin D deficiency 10/07/2010    Past Surgical History:  Procedure Laterality Date   CATARACT EXTRACTION, BILATERAL     CHOLECYSTECTOMY     COLON SURGERY     with diversion and resection   FALSE ANEURYSM REPAIR N/A 09/23/2019   Procedure: REPAIR FALSE ANEURYSM FOREHEAD;  Surgeon: Sherren Kerns, MD;  Location: [facility] OR;  Service: Vascular;  Laterality: N/A;   I & D EXTREMITY Left 01/28/2023   Procedure: LEFT LEG DEBRIDEMENT;  Surgeon: Nadara Mustard, MD;  Location: [facility] OR;  Service: Orthopedics;  Laterality: Left;   MITRAL VALVE REPLACEMENT  2002   with a st.jude mechanical valve   PROSTATE BIOPSY     TRIGGER FINGER RELEASE Left 06/04/2021   Procedure: RELEASE TRIGGER FINGER/A-1 PULLEY LEFT RING FINGER;  Surgeon: Cindee Salt, MD;  Location: [facility];  Service: Orthopedics;  Laterality: Left;   Family History:  Family History  Problem Relation Age of Onset   Heart failure Mother    Hypertension Mother    Lung cancer Sister    Family Psychiatric  History: unknown  Social History:  Social History   Substance and Sexual Activity  Alcohol Use Not Currently   Comment: occasional glass of wine     Social History   Substance and Sexual Activity  Drug Use No    Social History   Socioeconomic History   Marital status: Married    Spouse name: Not on file   Number of children: 2   Years of education: 18   Highest education level: Master's degree (e.g., MA, Nathan, MEng, MEd, MSW, MBA)  Occupational History   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    Comment: retired Hotel manager  Tobacco Use   Smoking status: Former    Current packs/day: 0.00    Average packs/day: 0.8 packs/day for 18.0 years (14.4 ttl pk-yrs)    Types: Cigarettes    Start date: 08/04/1961    Quit date: 08/05/1979    Years since quitting: 43.9   Smokeless tobacco: Never  Vaping Use   Vaping status: Never Used  Substance and Sexual Activity   Alcohol use: Not  Currently    Comment: occasional glass of wine   Drug use: No   Sexual activity: Not on file  Other Topics Concern   Not on file  Social History Narrative   Right handed    Lives with wife    Social Determinants of Health   Financial Resource Strain: Low Risk  (03/10/2023)   Received from [facility]   Overall Financial Resource Strain (CARDIA)    Difficulty of Paying Living Expenses: Not hard at all  Food Insecurity: No Food Insecurity (03/10/2023)   Received from [facility]   Hunger Vital Sign  Worried About Programme researcher, broadcasting/film/video in the Last Year: Never true    Ran Out of Food in the Last Year: Never true  Transportation Needs: No Transportation Needs (03/10/2023)   Received from [facility] - Transportation    Lack of Transportation (Medical): No    Lack of Transportation (Non-Medical): No  Physical Activity: Insufficiently Active (03/10/2023)   [facility]   Exercise Vital Sign    Days of Exercise per Week: 3 days    Minutes of Exercise per Session: 30 min  Stress: No Stress Concern Present (03/10/2023)   Received from [facility] - Occupational Stress Questionnaire    Feeling of Stress : Not at all  Social Connections: Moderately Integrated (03/10/2023)   Received from [facility]   Social Network    How would you rate your social network (family, work, friends)?: Adequate participation with social networks   Additional Social History:    Allergies:   Allergies  Allergen Reactions   Aricept [Donepezil Hcl] Other (See Comments)    Hallucinations    Namenda [Memantine Hcl] Other (See Comments)    Hallucinations     Labs:  Results for orders placed or performed during the hospital encounter of 06/27/23 (from the past 48 hour(s))  Comprehensive metabolic panel     Status: Abnormal   Collection Time: 06/27/23 11:04 AM  Result Value Ref Range   Sodium 140 135 - 145 mmol/L   Potassium 3.5 3.5 -  5.1 mmol/L   Chloride 104 98 - 111 mmol/L   CO2 27 22 - 32 mmol/L   Glucose, Bld 127 (H) 70 - 99 mg/dL    Comment: Glucose reference range applies only to samples taken after fasting for at least 8 hours.   BUN 21 8 - 23 mg/dL   Creatinine, Ser 5.63 0.61 - 1.24 mg/dL   Calcium 8.9 8.9 - 87.5 mg/dL   Total Protein 7.0 6.5 - 8.1 g/dL   Albumin 4.3 3.5 - 5.0 g/dL   AST 23 15 - 41 U/L   ALT 14 0 - 44 U/L   Alkaline Phosphatase 64 38 - 126 U/L   Total Bilirubin 1.2 (H) <1.2 mg/dL   GFR, Estimated >64 >33 mL/min    Comment: (NOTE) Calculated using the CKD-EPI Creatinine Equation (2021)    Anion gap 9 5 - 15    Comment: Performed at [facility], [address], [place], [place] [address]  Ethanol     Status: None   Collection Time: 06/27/23 11:04 AM  Result Value Ref Range   Alcohol, Ethyl (B) <10 <10 mg/dL    Comment: (NOTE) Lowest detectable limit for serum alcohol is 10 mg/dL.  For medical purposes only. Performed at [facility], [address], [place], [place] [address]   CBC with Differential     Status: None   Collection Time: 06/27/23 11:04 AM  Result Value Ref Range   WBC 6.3 4.0 - 10.5 K/uL   RBC 5.13 4.22 - 5.81 MIL/uL   Hemoglobin 14.4 13.0 - 17.0 g/dL   HCT 06.3 01.6 - 01.0 %   MCV 89.9 80.0 - 100.0 fL   MCH 28.1 26.0 - 34.0 pg   MCHC 31.2 30.0 - 36.0 g/dL   RDW 93.2 35.5 - 73.2 %   Platelets 246 150 - 400 K/uL   nRBC 0.0 0.0 - 0.2 %   Neutrophils Relative % 78 %  Neutro Abs 5.0 1.7 - 7.7 K/uL   Lymphocytes Relative 14 %   Lymphs Abs 0.9 0.7 - 4.0 K/uL   Monocytes Relative 6 %   Monocytes Absolute 0.4 0.1 - 1.0 K/uL   Eosinophils Relative 1 %   Eosinophils Absolute 0.1 0.0 - 0.5 K/uL   Basophils Relative 1 %   Basophils Absolute 0.0 0.0 - 0.1 K/uL   Immature Granulocytes 0 %   Abs Immature Granulocytes 0.02 0.00 - 0.07 K/uL    Comment: Performed at [facility], [address], [place],  [place] [address]  Protime-INR     Status: Abnormal   Collection Time: 06/27/23 11:04 AM  Result Value Ref Range   Prothrombin Time 32.5 (H) 11.4 - 15.2 seconds   INR 3.1 (H) 0.8 - 1.2    Comment: (NOTE) INR goal varies based on device and disease states. Performed at [facility], [address], [place], [place] [address]   CBG monitoring, ED     Status: Abnormal   Collection Time: 06/27/23 11:07 AM  Result Value Ref Range   Glucose-Capillary 100 (H) 70 - 99 mg/dL    Comment: Glucose reference range applies only to samples taken after fasting for at least 8 hours.  Urine rapid drug screen (hosp performed)     Status: None   Collection Time: 06/27/23  3:00 PM  Result Value Ref Range   Opiates NONE DETECTED NONE DETECTED   Cocaine NONE DETECTED NONE DETECTED   Benzodiazepines NONE DETECTED NONE DETECTED   Amphetamines NONE DETECTED NONE DETECTED   Tetrahydrocannabinol NONE DETECTED NONE DETECTED   Barbiturates NONE DETECTED NONE DETECTED    Comment: (NOTE) DRUG SCREEN FOR MEDICAL PURPOSES ONLY.  IF CONFIRMATION IS NEEDED FOR ANY PURPOSE, NOTIFY LAB WITHIN 5 DAYS.  LOWEST DETECTABLE LIMITS FOR URINE DRUG SCREEN Drug Class                     Cutoff (ng/mL) Amphetamine and metabolites    1000 Barbiturate and metabolites    200 Benzodiazepine                 200 Opiates and metabolites        300 Cocaine and metabolites        300 THC                            50 Performed at [facility], [address] [place], [place] [address]   Urinalysis, Routine w reflex microscopic -Urine, Clean Catch     Status: Abnormal   Collection Time: 06/27/23  3:00 PM  Result Value Ref Range   Color, Urine YELLOW YELLOW   APPearance CLEAR CLEAR   Specific Gravity, Urine 1.016 1.005 - 1.030   pH 5.0 5.0 - 8.0   Glucose, UA 50 (A) NEGATIVE mg/dL   Hgb urine dipstick MODERATE (A) NEGATIVE   Bilirubin Urine NEGATIVE NEGATIVE   Ketones, ur 5 (A) NEGATIVE mg/dL    Protein, ur NEGATIVE NEGATIVE mg/dL   Nitrite NEGATIVE NEGATIVE   Leukocytes,Ua NEGATIVE NEGATIVE   RBC / HPF 6-10 0 - 5 RBC/hpf   WBC, UA 0-5 0 - 5 WBC/hpf   Bacteria, UA NONE SEEN NONE SEEN   Squamous Epithelial / HPF 0-5 0 - 5 /HPF   Mucus PRESENT    Hyaline Casts, UA PRESENT     Comment: Performed at [facility], [address], [place], [place] [address]    Current Facility-Administered Medications  Medication Dose Route Frequency Provider Last Rate Last Admin   acetaminophen (TYLENOL) tablet 650 mg  650 mg Oral Q4H PRN Nathan Helper, PA-C       alum & mag hydroxide-simeth (MAALOX/MYLANTA) 200-200-20 MG/5ML suspension 30 mL  30 mL Oral Q6H PRN Nathan Helper, PA-C       [START ON 06/28/2023] escitalopram (LEXAPRO) tablet 20 mg  20 mg Oral q AM Nathan Helper, PA-C       gabapentin (NEURONTIN) capsule 300 mg  300 mg Oral Daily Nathan Helper, PA-C       LORazepam (ATIVAN) tablet 0.5 mg  0.5 mg Oral Q8H PRN Dahlia Byes C, NP       risperiDONE (RISPERDAL M-TABS) disintegrating tablet 2 mg  2 mg Oral Q8H PRN Nathan Helper, PA-C       And   LORazepam (ATIVAN) tablet 1 mg  1 mg Oral PRN Nathan Helper, PA-C       And   ziprasidone (GEODON) injection 20 mg  20 mg Intramuscular PRN Nathan Helper, PA-C       ondansetron [facility]) tablet 4 mg  4 mg Oral Q8H PRN Nathan Helper, PA-C       warfarin (COUMADIN) tablet 2.5 mg  2.5 mg Oral q1600 Nathan Helper, PA-C   2.5 mg at 06/27/23 1723   Warfarin - Physician Dosing Inpatient   Does not apply q1600 Nathan Helper, PA-C       Current Outpatient Medications  Medication Sig Dispense Refill   acetaminophen (TYLENOL) 325 MG tablet Take 650 mg by mouth every 6 (six) hours as needed for moderate pain or headache.     LORazepam (ATIVAN) 0.5 MG tablet Take 0.5 mg by mouth as needed for anxiety or sedation.     Multiple Vitamin (MULTIVITAMIN WITH MINERALS) TABS tablet Take 1 tablet by mouth in the morning.     OLANZapine zydis (ZYPREXA) 5 MG  disintegrating tablet Take 5 mg by mouth daily.     tamsulosin (FLOMAX) 0.4 MG CAPS capsule Take 0.4 mg by mouth at bedtime.     warfarin (COUMADIN) 2.5 MG tablet Take 2.5 mg by mouth every evening.     empagliflozin (JARDIANCE) 10 MG TABS tablet Take 1 tablet (10 mg total) by mouth daily before breakfast. (Patient not taking: Reported on 06/27/2023) 30 tablet 1   escitalopram (LEXAPRO) 20 MG tablet Take 20 mg by mouth in the morning. (Patient not taking: Reported on 06/27/2023)     gabapentin (NEURONTIN) 300 MG capsule Take 1 capsule (300 mg total) by mouth daily. (Patient not taking: Reported on 06/27/2023) 30 capsule 0   levocetirizine (XYZAL) 5 MG tablet Take by mouth. (Patient not taking: Reported on 06/27/2023)     pregabalin (LYRICA) 75 MG capsule Take by mouth. (Patient not taking: Reported on 06/27/2023)      Musculoskeletal: Strength & Muscle Tone:  gait is unsteady Gait & Station: unsteady Patient leans:  see above   Psychiatric Specialty Exam: Presentation  General Appearance:  Casual  Eye Contact: Fleeting  Speech: Other (comment) (not responding to questions.)  Speech Volume: Other (comment) (not talking, just fidgeting with blanket)  Handedness: Right   Mood and Affect  Mood: -- (unable to assess, patient is non contributory.)  Affect: Restricted   Thought Process  Thought Processes: Other (comment) (Unable to assess.)  Descriptions of Associations:-- (Unable to assess)  Orientation:None  Thought Content:Other (comment) (Unable to  assess)  History of Schizophrenia/Schizoaffective disorder:No data recorded Duration of Psychotic Symptoms:No data recorded Hallucinations:Hallucinations: None  Ideas of Reference:None  Suicidal Thoughts:Suicidal Thoughts: -- (Unable to assess)  Homicidal Thoughts:Homicidal Thoughts: -- (Unable to assess)   Sensorium  Memory: Immediate Poor; Recent Poor; Remote Poor  Judgment: -- (Unable to  assess)  Insight: -- (Unable to assess)   Executive Functions  Concentration: Poor  Attention Span: Poor  Recall: Poor  Fund of Knowledge: Poor  Language: Other (comment) (not responding to questions.)   Psychomotor Activity  Psychomotor Activity:No data recorded  Assets  Assets: Intimacy; Housing    Sleep  Sleep: Sleep: Fair   Physical Exam: Physical Exam Vitals and nursing note reviewed.  Constitutional:      Appearance: Normal appearance.  HENT:     Nose: Nose normal.  Cardiovascular:     Rate and Rhythm: Normal rate.  Pulmonary:     Effort: Pulmonary effort is normal.  Musculoskeletal:     Comments: Gait is unstaedy  Skin:    General: Skin is dry.  Neurological:     Mental Status: He is alert.     Comments: Oriented x0  Psychiatric:        Attention and Perception: He is inattentive.        Speech: He is noncommunicative.        Behavior: Behavior is uncooperative.        Cognition and Memory: Cognition is impaired. Memory is impaired. He exhibits impaired recent memory and impaired remote memory.        Judgment: Judgment is inappropriate.   - ROS-Unable to engage secondary to Dementia. Blood pressure 112/64, pulse 64, temperature 97.9 F (36.6 C), temperature source Oral, resp. rate 16, height 5\' 6"  (1.676 m), weight 72.6 kg, SpO2 97%. Body mass index is 25.82 kg/m.  Medical Decision Making: Patient is cognitively impaired needing constant supervision.  Ativan 0.5 mg q8 hours is prescribed for agitation and restlessness.  He fails Aricept and Namenda.  He did not do well on Olanzapine.  We may consider Seroquel low dose before discharge.  Provider advised Mrs Durbano to start accessing Memory care unit.placement.  Disposition:  Monitor overnight.and reevaluate in am.  Earney Navy, NP-PMHNP-BC 06/27/2023 6:39 PM

## 2023-06-27 NOTE — ED Notes (Signed)
Patient transported to CT 

## 2023-06-27 NOTE — ED Provider Notes (Signed)
[place] EMERGENCY DEPARTMENT AT [facility] Provider Note   CSN: [id number] Arrival date & time: 06/27/23  4696     History  Chief Complaint  Patient presents with   Altered Mental Status    Nathan Mcdaniel is a 82 y.o. male.  The history is provided by the patient, a relative, the EMS personnel and medical records. No language interpreter was used.  Altered Mental Status  82 year old male significant history of dementia, atrial fibrillation currently on Coumadin, hypertension, brought here via EMS for evaluation of altered mental status.  History obtained through daughter over the phone.  For the past 1 to 2 weeks family have noticed a steady decline in his mentation as patient is more more confused.  He does not recognize his wife, he would leave the house and go over to the neighbors house, and he seems to be sundowning.  His daughter recently evaluated him a few days ago and started Zyprexa.  Family tried to give him medication yesterday but noticed no improvement.  At this time, I have the patient left the house to go over to another neighbor house, family felt they are unable to care for him appropriately at home and EMS brought him here.  Unfortunately I am unable to obtain much history from patient as he is actively requesting to go home and denies having any active problem.  He denies having active pain no headache no trouble eating or drinking.   Home Medications Prior to Admission medications   Medication Sig Start Date End Date Taking? Authorizing Provider  acetaminophen (TYLENOL) 325 MG tablet Take 650 mg by mouth every 6 (six) hours as needed for moderate pain or headache.    [provider]  empagliflozin (JARDIANCE) 10 MG TABS tablet Take 1 tablet (10 mg total) by mouth daily before breakfast. 05/04/23   Daine Floras, Gavin Pound, NP  escitalopram (LEXAPRO) 20 MG tablet Take 20 mg by mouth in the morning.    [provider]  gabapentin (NEURONTIN)  300 MG capsule Take 1 capsule (300 mg total) by mouth daily. 05/29/23   Daria Pastures, MD  LORazepam (ATIVAN) 0.5 MG tablet Take 0.5 mg by mouth every 6 (six) hours as needed for anxiety.    [provider]  Multiple Vitamin (MULTIVITAMIN WITH MINERALS) TABS tablet Take 1 tablet by mouth in the morning.    [provider]  tamsulosin (FLOMAX) 0.4 MG CAPS capsule Take 0.4 mg by mouth at bedtime.    [provider]  warfarin (COUMADIN) 3 MG tablet TAKE 1/2 - 1 TABLET DAILY OR AS DIRECTED BY COUMADIN CLINIC Patient taking differently: Take 3 mg by mouth every other day. In the evening. 09/21/17   [place], Peter M, MD      Allergies    Aricept Palma Holter hcl] and Teresa Coombs hcl]    Review of Systems   Review of Systems  All other systems reviewed and are negative.   Physical Exam Updated Vital Signs BP 112/64 (BP Location: Right Arm)   Pulse 64   Temp 97.9 F (36.6 C) (Oral)   Resp 16   Ht 5\' 6"  (1.676 m)   Wt 72.6 kg   SpO2 97%   BMI 25.82 kg/m  Physical Exam Vitals and nursing note reviewed.  Constitutional:      General: He is not in acute distress.    Appearance: He is well-developed.     Comments: Patient is well-dressed, refused to put on a gown, and  actively requesting to leave.  HENT:     Head: Normocephalic and atraumatic.  Eyes:     Conjunctiva/sclera: Conjunctivae normal.  Cardiovascular:     Rate and Rhythm: Rhythm irregular.     Pulses: Normal pulses.     Heart sounds: Murmur heard.  Pulmonary:     Effort: Pulmonary effort is normal.     Breath sounds: Normal breath sounds.  Abdominal:     Palpations: Abdomen is soft.  Musculoskeletal:     Cervical back: Neck supple.     Comments: Moving all 4 extremities with equal effort, steady gait.  Skin:    Findings: No rash.  Neurological:     Mental Status: He is alert.     GCS: GCS eye subscore is 4. GCS verbal subscore is 5. GCS motor subscore is 6.  Psychiatric:         Attention and Perception: He is inattentive.        Mood and Affect: Mood is anxious.        Speech: Speech normal.        Behavior: Behavior is agitated.        Thought Content: Thought content is not paranoid. Thought content does not include homicidal or suicidal ideation.        Cognition and Memory: Cognition is impaired. Memory is impaired.        Judgment: Judgment is impulsive.     ED Results / Procedures / Treatments   Labs (all labs ordered are listed, but only abnormal results are displayed) Labs Reviewed  COMPREHENSIVE METABOLIC PANEL - Abnormal; Notable for the following components:      Result Value   Glucose, Bld 127 (*)    Total Bilirubin 1.2 (*)    All other components within normal limits  PROTIME-INR - Abnormal; Notable for the following components:   Prothrombin Time 32.5 (*)    INR 3.1 (*)    All other components within normal limits  CBG MONITORING, ED - Abnormal; Notable for the following components:   Glucose-Capillary 100 (*)    All other components within normal limits  ETHANOL  CBC WITH DIFFERENTIAL/PLATELET  RAPID URINE DRUG SCREEN, HOSP PERFORMED  URINALYSIS, ROUTINE W REFLEX MICROSCOPIC    EKG EKG Interpretation Date/Time:  Saturday June 27 2023 10:58:08 EST Ventricular Rate:  104 PR Interval:    QRS Duration:  125 QT Interval:  388 QTC Calculation: 511 R Axis:   -2  Text Interpretation: Normal sinus rhythm Ventricular bigeminy Left bundle branch block No significant change since last tracing Confirmed by Elayne Snare ([id number]) on 06/27/2023 11:02:00 AM  Radiology CT Head Wo Contrast  Result Date: 06/27/2023 CLINICAL DATA:  82 year old male with increasing confusion. EXAM: CT HEAD WITHOUT CONTRAST TECHNIQUE: Contiguous axial images were obtained from the base of the skull through the vertex without intravenous contrast. RADIATION DOSE REDUCTION: This exam was performed according to the departmental dose-optimization program which  includes automated exposure control, adjustment of the mA and/or kV according to patient size and/or use of iterative reconstruction technique. COMPARISON:  Brain MRI 05/23/2023 and earlier. FINDINGS: Brain: Stable cerebral volume. No midline shift, ventriculomegaly, mass effect, evidence of mass lesion, intracranial hemorrhage or evidence of cortically based acute infarction. Gray-white matter differentiation is stable and largely normal for age. 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 Vascular: No suspicious intracranial vascular hyperdensity. Mild Calcified atherosclerosis at the skull base. Skull: Stable, intact. Sinuses/Orbits: Visualized paranasal sinuses and mastoids are clear. Other: No  acute orbit or scalp soft tissue finding. IMPRESSION: Stable and largely negative for age noncontrast CT appearance of the brain. Electronically Signed   By: Odessa Fleming M.D.   On: 06/27/2023 11:29    Procedures Procedures    Medications Ordered in ED Medications  risperiDONE (RISPERDAL M-TABS) disintegrating tablet 2 mg (has no administration in time range)    And  LORazepam (ATIVAN) tablet 1 mg (has no administration in time range)    And  ziprasidone (GEODON) injection 20 mg (has no administration in time range)  acetaminophen (TYLENOL) tablet 650 mg (has no administration in time range)  zolpidem (AMBIEN) tablet 5 mg (has no administration in time range)  ondansetron (ZOFRAN) tablet 4 mg (has no administration in time range)  alum & mag hydroxide-simeth (MAALOX/MYLANTA) 200-200-20 MG/5ML suspension 30 mL (has no administration in time range)  empagliflozin (JARDIANCE) tablet 10 mg (has no administration in time range)  escitalopram (LEXAPRO) tablet 20 mg (has no administration in time range)  gabapentin (NEURONTIN) capsule 300 mg (has no administration in time range)  warfarin (COUMADIN) tablet 3 mg (has no administration in time range)  LORazepam (ATIVAN) injection 1 mg (1 mg  Intravenous Given 06/27/23 1102)    ED Course/ Medical Decision Making/ A&P                                 Medical Decision Making Amount and/or Complexity of Data Reviewed Labs: ordered. Radiology: ordered.  Risk OTC drugs. Prescription drug management.   BP (!) 167/88 (BP Location: Right Arm)   Pulse 61   Temp 97.9 F (36.6 C) (Oral)   Resp 14   Ht 5\' 6"  (1.676 m)   Wt 72.6 kg   SpO2 98%   BMI 25.82 kg/m   37:68 AM 82 year old male significant history of dementia, atrial fibrillation currently on Coumadin, hypertension, brought here via EMS for evaluation of altered mental status.  History obtained through daughter over the phone.  For the past 1 to 2 weeks family have noticed a steady decline in his mentation as patient is more more confused.  He does not recognize his wife, he would leave the house and go over to the neighbors house, and he seems to be sundowning.  His daughter recently evaluated him a few days ago and started Zyprexa.  Family tried to give him medication yesterday but noticed no improvement.  At this time, I have the patient left the house to go over to another neighbor house, family felt they are unable to care for him appropriately at home and EMS brought him here.  Unfortunately I am unable to obtain much history from patient as he is actively requesting to go home and denies having any active problem.  He denies having active pain no headache no trouble eating or drinking.  Exam remarkable for an elderly male who appears agitated as he does not want to be in the hospital.  However he is not exhibiting any psychotic feature.  He is overall well-dressed.  And in no acute physical distress.  Given his worsening dementia, will screen for potential underlying cause that may aggravate his confusion such as Urinary tract infection or electrolyte derangement.  -Labs ordered, independently viewed and interpreted by me.  Labs remarkable for reassurinv value.  UA  currently pending -The patient was maintained on a cardiac monitor.  I personally viewed and interpreted the cardiac monitored which showed an underlying  rhythm of: NSR -Imaging independently viewed and interpreted by me and I agree with radiologist's interpretation.  Result remarkable for head CT without acute changes -This patient presents to the ED for concern of AMS, this involves an extensive number of treatment options, and is a complaint that carries with it a high risk of complications and morbidity.  The differential diagnosis includes worsening dementia, delirium, depression, anxiety, metabolic derangement, vascular stroke -Co morbidities that complicate the patient evaluation includes dementia -Treatment includes ativan, supportive care -Reevaluation of the patient after these medicines showed that the patient improved -PCP office notes or outside notes reviewed -Discussion with attending Dr. Theresia Lo -Escalation to admission/observation considered: Pt is medically cleared and can be assess further by geri-psych.          Final Clinical Impression(s) / ED Diagnoses Final diagnoses:  Moderate dementia with agitation, unspecified dementia type [facility])    Rx / DC Orders ED Discharge Orders     None         Fayrene Helper, PA-C 06/27/23 1500    Rexford Maus, DO 06/27/23 1542

## 2023-06-27 NOTE — ED Notes (Signed)
Patient to room 29.  Patient sleeping,  breaths equal and unlabored.

## 2023-06-28 ENCOUNTER — Other Ambulatory Visit: Payer: Self-pay | Admitting: Student

## 2023-06-28 LAB — CBC WITH DIFFERENTIAL/PLATELET
Abs Immature Granulocytes: 0.01 10*3/uL (ref 0.00–0.07)
Basophils Absolute: 0 10*3/uL (ref 0.0–0.1)
Basophils Relative: 0 %
Eosinophils Absolute: 0 10*3/uL (ref 0.0–0.5)
Eosinophils Relative: 0 %
HCT: 40.9 % (ref 39.0–52.0)
Hemoglobin: 13.3 g/dL (ref 13.0–17.0)
Immature Granulocytes: 0 %
Lymphocytes Relative: 9 %
Lymphs Abs: 0.5 10*3/uL — ABNORMAL LOW (ref 0.7–4.0)
MCH: 28.9 pg (ref 26.0–34.0)
MCHC: 32.5 g/dL (ref 30.0–36.0)
MCV: 88.7 fL (ref 80.0–100.0)
Monocytes Absolute: 0.3 10*3/uL (ref 0.1–1.0)
Monocytes Relative: 5 %
Neutro Abs: 4.7 10*3/uL (ref 1.7–7.7)
Neutrophils Relative %: 86 %
Platelets: 227 10*3/uL (ref 150–400)
RBC: 4.61 MIL/uL (ref 4.22–5.81)
RDW: 15 % (ref 11.5–15.5)
WBC: 5.5 10*3/uL (ref 4.0–10.5)
nRBC: 0 % (ref 0.0–0.2)

## 2023-06-28 LAB — BASIC METABOLIC PANEL
Anion gap: 9 (ref 5–15)
BUN: 20 mg/dL (ref 8–23)
CO2: 26 mmol/L (ref 22–32)
Calcium: 8.9 mg/dL (ref 8.9–10.3)
Chloride: 105 mmol/L (ref 98–111)
Creatinine, Ser: 0.96 mg/dL (ref 0.61–1.24)
GFR, Estimated: >60 mL/min (ref >60)
Glucose, Bld: 119 mg/dL — ABNORMAL HIGH (ref 70–99)
Potassium: 4.8 mmol/L (ref 3.5–5.1)
Sodium: 140 mmol/L (ref 135–145)

## 2023-06-28 MED ORDER — RISPERIDONE 1 MG PO TBDP
1.0000 mg | ORAL_TABLET | Freq: Three times a day (TID) | ORAL | Status: DC | PRN
  Administered 2023-06-28 – 2023-07-06 (×8): 1 mg via ORAL
  Filled 2023-06-28 (×8): qty 2

## 2023-06-28 MED ORDER — QUETIAPINE FUMARATE 25 MG PO TABS: 12.5000 mg | ORAL_TABLET | Freq: Two times a day (BID) | ORAL | Status: DC

## 2023-06-28 MED ORDER — LORAZEPAM 1 MG PO TABS
1.0000 mg | ORAL_TABLET | Freq: Once | ORAL | Status: AC
  Administered 2023-06-28: 1 mg via ORAL
  Filled 2023-06-28: qty 1

## 2023-06-28 MED ORDER — SODIUM CHLORIDE 0.9 % IV BOLUS
1000.0000 mL | Freq: Once | INTRAVENOUS | Status: AC
  Administered 2023-06-28: 1000 mL via INTRAVENOUS

## 2023-06-28 NOTE — ED Notes (Signed)
Requested activity blanket for pt from materials.

## 2023-06-28 NOTE — ED Notes (Signed)
Pt repositioned and placed in chair, offered towel folding, and other distraction activities without success he continues trying to get up and leave  provider noticed

## 2023-06-28 NOTE — ED Notes (Signed)
11:11 PM  Patient sleeping with equal rise and fall of the chest wall. Patient in NAD. Sitter present at bedside. Bed in lowest position. Bed alarm on. Plan of care ongoing.   12:18 AM  Patient sleeping with equal rise and fall of the chest wall. Patient in NAD. Sitter present at bedside. Bed in lowest position. Bed alarm on. Plan of care ongoing.   1:19 AM  Patient sleeping with equal rise and fall of the chest wall. Patient in NAD. Sitter present at bedside. Bed in lowest position. Bed alarm on. Plan of care ongoing.   2:13 AM  Patient sleeping with equal rise and fall of the chest wall. Patient in NAD. Sitter present at bedside. Bed in lowest position. Bed alarm on. Plan of care ongoing.   3:00 AM  Patient sleeping with equal rise and fall of the chest wall. Patient in NAD. Sitter present at bedside. Bed in lowest position. Bed alarm on. Plan of care ongoing.   3:20 AM  Patient attempting to get OOB. Several attempts made to reorient and distract patient with no success. Patient is toileted with brief being changed. He is offered fluids, but refuses to drink. Patient educated to stay in bed due to being high fall risk, but patient continues to attempt to get out of bed without assistance. Patient becomes combative, attempting to hit this RN and CNA at bedside while staff attempts to get patient back into bed. Music turned on for distraction, with no success. Patient given crayons and color book but does not attempt to color. Patient given towels and blankets for folding for distraction with no success in distraction. Dr. Tilden Fossa notified and order for safety restraints placed.   3:46 AM  Patient placed on bilateral soft wrist and soft ankle restraints. Patient pulling at restraints and attempting to kick and hit staff as restraints are placed.   4:45 AM  Patient remains pulling at restraints and trying to get out of bed. Patient offered apple sauce and apple juice. Patient  does take a few bites of apple sauce and drinks a couple of sips of apple juice before attempting to bite this RN and pulling at restraints. Patient is medicated per order.

## 2023-06-28 NOTE — ED Notes (Signed)
Patient resting in bed at this time.

## 2023-06-28 NOTE — ED Notes (Signed)
Patient got up combative, trying to get out of bed. Walked with 2 assist to restroom, was able to urinate. Walked back to bed with 2 assist and sleeping at this time.

## 2023-06-28 NOTE — ED Notes (Signed)
Activity blanket given to pt

## 2023-06-28 NOTE — ED Notes (Signed)
Patient sleeping at this time.

## 2023-06-28 NOTE — Progress Notes (Cosign Needed Addendum)
[facility] Psych ED Progress Note  06/28/2023 10:37 AM Nathan Mcdaniel  MRN:  [id number]   Subjective:  Nathan Mcdaniel is a 82 y.o. male patient admitted with hx significant for Dementia was brought in by EMS from home.for increased episodes of confusion and insomnia Patient was seen this morning restless and on Bilateral restraint attempting to get out of bed.  Earlier two staff members walked him to the bathroom but he did not void.  UA sent to lab yesterday came back Negative. Patient remains restless requiring sitter staying in the room with him.  Bilateral soft wrist restraint removed and patient allowed out of the bed for few minutes.  He takes his medications and was Medicated with Risperidone PRN for agitation.  Patient was given PRN Ativan orally tem minutes ago.  Wife called and states she is coming to visit.  Seroquel 12.5 mg twice a day is discontinued due to QTC RATE OF 511.  We will continue to monitor and and make Medication changes as needed.  Appetite and sleep remains poor. Principal Problem: Dementia with behavioral disturbance (HCC) Diagnosis:  Principal Problem:   Dementia with behavioral disturbance [facility])   ED Assessment Time Calculation: Start Time: 1021 Stop Time: 1037 Total Time in Minutes (Assessment Completion): 16   Past Psychiatric History: see initial Psychiatry evaluation note  [place] Scale:  Flowsheet Row ED from 06/27/2023 in [facility] Emergency Department at [facility] ED from 06/23/2023 in [facility] Emergency Department at [facility] ED from 05/23/2023 in [facility] Emergency Department at [facility]  C-SSRS RISK CATEGORY No Risk No Risk No Risk       Past Medical History:  Past Medical History:  Diagnosis Date   Abnormal ECG    Allergic rhinitis 10/24/2009   Asymptomatic varicose veins 10/07/2010   Atrial fibrillation 10/14/2011   Dr Thomasene Lot follows and checks inr   Benign prostatic hyperplasia with urinary obstruction  10/07/2010   Dementia (HCC)    Elevated prostate specific antigen (PSA) 11/26/2010   Generalized anxiety disorder    Headache    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   History of skin cancer    Hypertension    Major depressive disorder    Memory disorder    Mild neurocognitive disorder, 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   Mitral insufficiency    Mobitz type 1 second degree atrioventricular block    Palpitations    Pseudoaneurysm    of forehead   Pure hypercholesterolemia 10/07/2010   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   Stroke 06/09/2018   Brain MRI revealed two remote tiny infarcts in the left cerebellum   Vitamin D deficiency 10/07/2010    Past Surgical History:  Procedure Laterality Date   CATARACT EXTRACTION, BILATERAL     CHOLECYSTECTOMY     COLON SURGERY     with diversion and resection   FALSE ANEURYSM REPAIR N/A 09/23/2019   Procedure: REPAIR FALSE ANEURYSM FOREHEAD;  Surgeon: Sherren Kerns, MD;  Location: [facility] OR;  Service: Vascular;  Laterality: N/A;   I & D EXTREMITY Left 01/28/2023   Procedure: LEFT LEG DEBRIDEMENT;  Surgeon: Nadara Mustard, MD;  Location: [facility] OR;  Service: Orthopedics;  Laterality: Left;   MITRAL VALVE REPLACEMENT  2002   with a st.jude mechanical valve   PROSTATE BIOPSY     TRIGGER FINGER RELEASE Left 06/04/2021   Procedure: RELEASE TRIGGER FINGER/A-1 PULLEY LEFT RING FINGER;  Surgeon: Cindee Salt, MD;  Location: [facility];  Service: Orthopedics;  Laterality: Left;   Family History:  Family History  Problem Relation Age of Onset   Heart failure Mother    Hypertension Mother    Lung cancer Sister    Family Psychiatric  History: see initial Psychiatry evaluation note Social History:  Social History   Substance and Sexual Activity  Alcohol Use Not Currently   Comment: occasional glass of wine     Social History   Substance and Sexual Activity  Drug Use No    Social History   Socioeconomic History    Marital status: Married    Spouse name: Not on file   Number of children: 2   Years of education: 18   Highest education level: Master's degree (e.g., MA, MS, MEng, MEd, MSW, MBA)  Occupational History   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    Comment: retired Hotel manager  Tobacco Use   Smoking status: Former    Current packs/day: 0.00    Average packs/day: 0.8 packs/day for 18.0 years (14.4 ttl pk-yrs)    Types: Cigarettes    Start date: 08/04/1961    Quit date: 08/05/1979    Years since quitting: 43.9   Smokeless tobacco: Never  Vaping Use   Vaping status: Never Used  Substance and Sexual Activity   Alcohol use: Not Currently    Comment: occasional glass of wine   Drug use: No   Sexual activity: Not on file  Other Topics Concern   Not on file  Social History Narrative   Right handed    Lives with wife    Social Determinants of Health   Financial Resource Strain: Low Risk  (03/10/2023)   Received from [facility]   Overall Financial Resource Strain (CARDIA)    Difficulty of Paying Living Expenses: Not hard at all  Food Insecurity: No Food Insecurity (03/10/2023)   Received from [facility]   Hunger Vital Sign    Worried About Running Out of Food in the Last Year: Never true    Ran Out of Food in the Last Year: Never true  Transportation Needs: No Transportation Needs (03/10/2023)   Received from [facility] - Transportation    Lack of Transportation (Medical): No    Lack of Transportation (Non-Medical): No  Physical Activity: Insufficiently Active (03/10/2023)   Received from [facility]   Exercise Vital Sign    Days of Exercise per Week: 3 days    Minutes of Exercise per Session: 30 min  Stress: No Stress Concern Present (03/10/2023)   Received from [facility] - Occupational Stress Questionnaire    Feeling of Stress : Not at all  Social Connections: Moderately Integrated (03/10/2023)   Received from  [facility]   Social Network    How would you rate your social network (family, work, friends)?: Adequate participation with social networks    Sleep: Poor  Appetite:  Poor  Current Medications: Current Facility-Administered Medications  Medication Dose Route Frequency Provider Last Rate Last Admin   acetaminophen (TYLENOL) tablet 650 mg  650 mg Oral Q4H PRN Fayrene Helper, PA-C       alum & mag hydroxide-simeth (MAALOX/MYLANTA) 200-200-20 MG/5ML suspension 30 mL  30 mL Oral Q6H PRN Fayrene Helper, PA-C       escitalopram (LEXAPRO) tablet 20 mg  20 mg Oral q AM Fayrene Helper, PA-C   20 mg at 06/28/23 0919   gabapentin (NEURONTIN) capsule 300  mg  300 mg Oral Daily Fayrene Helper, PA-C   300 mg at 06/28/23 5621   LORazepam (ATIVAN) tablet 0.5 mg  0.5 mg Oral Q8H PRN Dahlia Byes C, NP   0.5 mg at 06/28/23 0957   ondansetron (ZOFRAN) tablet 4 mg  4 mg Oral Q8H PRN Fayrene Helper, PA-C       risperiDONE (RISPERDAL M-TABS) disintegrating tablet 2 mg  2 mg Oral Q8H PRN Fayrene Helper, PA-C   2 mg at 06/28/23 3086   warfarin (COUMADIN) tablet 2.5 mg  2.5 mg Oral q1600 Fayrene Helper, PA-C   2.5 mg at 06/27/23 1723   Warfarin - Physician Dosing Inpatient   Does not apply q1600 Fayrene Helper, PA-C       Current Outpatient Medications  Medication Sig Dispense Refill   acetaminophen (TYLENOL) 325 MG tablet Take 650 mg by mouth every 6 (six) hours as needed for moderate pain or headache.     LORazepam (ATIVAN) 0.5 MG tablet Take 0.5 mg by mouth as needed for anxiety or sedation.     Multiple Vitamin (MULTIVITAMIN WITH MINERALS) TABS tablet Take 1 tablet by mouth in the morning.     OLANZapine zydis (ZYPREXA) 5 MG disintegrating tablet Take 5 mg by mouth daily.     tamsulosin (FLOMAX) 0.4 MG CAPS capsule Take 0.4 mg by mouth at bedtime.     warfarin (COUMADIN) 2.5 MG tablet Take 2.5 mg by mouth every evening.     empagliflozin (JARDIANCE) 10 MG TABS tablet Take 1 tablet (10 mg total) by mouth daily before  breakfast. (Patient not taking: Reported on 06/27/2023) 30 tablet 1   escitalopram (LEXAPRO) 20 MG tablet Take 20 mg by mouth in the morning. (Patient not taking: Reported on 06/27/2023)     gabapentin (NEURONTIN) 300 MG capsule Take 1 capsule (300 mg total) by mouth daily. (Patient not taking: Reported on 06/27/2023) 30 capsule 0   levocetirizine (XYZAL) 5 MG tablet Take by mouth. (Patient not taking: Reported on 06/27/2023)     pregabalin (LYRICA) 75 MG capsule Take by mouth. (Patient not taking: Reported on 06/27/2023)      Lab Results:  Results for orders placed or performed during the hospital encounter of 06/27/23 (from the past 48 hour(s))  Comprehensive metabolic panel     Status: Abnormal   Collection Time: 06/27/23 11:04 AM  Result Value Ref Range   Sodium 140 135 - 145 mmol/L   Potassium 3.5 3.5 - 5.1 mmol/L   Chloride 104 98 - 111 mmol/L   CO2 27 22 - 32 mmol/L   Glucose, Bld 127 (H) 70 - 99 mg/dL    Comment: Glucose reference range applies only to samples taken after fasting for at least 8 hours.   BUN 21 8 - 23 mg/dL   Creatinine, Ser 5.78 0.61 - 1.24 mg/dL   Calcium 8.9 8.9 - 46.9 mg/dL   Total Protein 7.0 6.5 - 8.1 g/dL   Albumin 4.3 3.5 - 5.0 g/dL   AST 23 15 - 41 U/L   ALT 14 0 - 44 U/L   Alkaline Phosphatase 64 38 - 126 U/L   Total Bilirubin 1.2 (H) <1.2 mg/dL   GFR, Estimated >62 >95 mL/min    Comment: (NOTE) Calculated using the CKD-EPI Creatinine Equation (2021)    Anion gap 9 5 - 15    Comment: Performed at [facility], [address] [place], [place] [address]  Ethanol     Status: None  Collection Time: 06/27/23 11:04 AM  Result Value Ref Range   Alcohol, Ethyl (B) <10 <10 mg/dL    Comment: (NOTE) Lowest detectable limit for serum alcohol is 10 mg/dL.  For medical purposes only. Performed at [facility], [address] [place], [place] [address]   CBC with Differential     Status: None   Collection Time:  06/27/23 11:04 AM  Result Value Ref Range   WBC 6.3 4.0 - 10.5 K/uL   RBC 5.13 4.22 - 5.81 MIL/uL   Hemoglobin 14.4 13.0 - 17.0 g/dL   HCT 46.5 03.5 - 46.5 %   MCV 89.9 80.0 - 100.0 fL   MCH 28.1 26.0 - 34.0 pg   MCHC 31.2 30.0 - 36.0 g/dL   RDW 68.1 27.5 - 17.0 %   Platelets 246 150 - 400 K/uL   nRBC 0.0 0.0 - 0.2 %   Neutrophils Relative % 78 %   Neutro Abs 5.0 1.7 - 7.7 K/uL   Lymphocytes Relative 14 %   Lymphs Abs 0.9 0.7 - 4.0 K/uL   Monocytes Relative 6 %   Monocytes Absolute 0.4 0.1 - 1.0 K/uL   Eosinophils Relative 1 %   Eosinophils Absolute 0.1 0.0 - 0.5 K/uL   Basophils Relative 1 %   Basophils Absolute 0.0 0.0 - 0.1 K/uL   Immature Granulocytes 0 %   Abs Immature Granulocytes 0.02 0.00 - 0.07 K/uL    Comment: Performed at [facility], [address] [place], [place] [address]  Protime-INR     Status: Abnormal   Collection Time: 06/27/23 11:04 AM  Result Value Ref Range   Prothrombin Time 32.5 (H) 11.4 - 15.2 seconds   INR 3.1 (H) 0.8 - 1.2    Comment: (NOTE) INR goal varies based on device and disease states. Performed at [facility], [address], [place], [place] [address]   CBG monitoring, ED     Status: Abnormal   Collection Time: 06/27/23 11:07 AM  Result Value Ref Range   Glucose-Capillary 100 (H) 70 - 99 mg/dL    Comment: Glucose reference range applies only to samples taken after fasting for at least 8 hours.  Urine rapid drug screen (hosp performed)     Status: None   Collection Time: 06/27/23  3:00 PM  Result Value Ref Range   Opiates NONE DETECTED NONE DETECTED   Cocaine NONE DETECTED NONE DETECTED   Benzodiazepines NONE DETECTED NONE DETECTED   Amphetamines NONE DETECTED NONE DETECTED   Tetrahydrocannabinol NONE DETECTED NONE DETECTED   Barbiturates NONE DETECTED NONE DETECTED    Comment: (NOTE) DRUG SCREEN FOR MEDICAL PURPOSES ONLY.  IF CONFIRMATION IS NEEDED FOR ANY PURPOSE, NOTIFY LAB WITHIN 5  DAYS.  LOWEST DETECTABLE LIMITS FOR URINE DRUG SCREEN Drug Class                     Cutoff (ng/mL) Amphetamine and metabolites    1000 Barbiturate and metabolites    200 Benzodiazepine                 200 Opiates and metabolites        300 Cocaine and metabolites        300 THC                            50 Performed at [facility], [address], [place], [place] [address]  Urinalysis, Routine w reflex microscopic -Urine, Clean Catch     Status: Abnormal   Collection Time: 06/27/23  3:00 PM  Result Value Ref Range   Color, Urine YELLOW YELLOW   APPearance CLEAR CLEAR   Specific Gravity, Urine 1.016 1.005 - 1.030   pH 5.0 5.0 - 8.0   Glucose, UA 50 (A) NEGATIVE mg/dL   Hgb urine dipstick MODERATE (A) NEGATIVE   Bilirubin Urine NEGATIVE NEGATIVE   Ketones, ur 5 (A) NEGATIVE mg/dL   Protein, ur NEGATIVE NEGATIVE mg/dL   Nitrite NEGATIVE NEGATIVE   Leukocytes,Ua NEGATIVE NEGATIVE   RBC / HPF 6-10 0 - 5 RBC/hpf   WBC, UA 0-5 0 - 5 WBC/hpf   Bacteria, UA NONE SEEN NONE SEEN   Squamous Epithelial / HPF 0-5 0 - 5 /HPF   Mucus PRESENT    Hyaline Casts, UA PRESENT     Comment: Performed at [facility], [address], [place], [place] [address]    Blood Alcohol level:  Lab Results  Component Value Date   [facility] <10 06/27/2023   ETH <10 06/23/2023    Physical Findings:  CIWA:    COWS:     Musculoskeletal: Strength & Muscle Tone: decreased and requires assistance of two to ambulate short distance to the bathroom. Gait & Station:  see above Patient leans:  see above.  Psychiatric Specialty Exam:  Presentation  General Appearance:  Casual  Eye Contact: Fleeting  Speech: Other (comment) (not responding to questions.)  Speech Volume: Other (comment) (not talking, just fidgeting with blanket)  Handedness: Right   Mood and Affect  Mood: -- (unable to assess, patient is non  contributory.)  Affect: Restricted   Thought Process  Thought Processes: Other (comment) (Unable to assess.)  Descriptions of Associations:-- (Unable to assess)  Orientation:None  Thought Content:Other (comment) (Unable to assess)  History of Schizophrenia/Schizoaffective disorder:No data recorded Duration of Psychotic Symptoms:No data recorded Hallucinations:Hallucinations: None  Ideas of Reference:None  Suicidal Thoughts:Suicidal Thoughts: -- (Unable to assess)  Homicidal Thoughts:Homicidal Thoughts: -- (Unable to assess)   Sensorium  Memory: Immediate Poor; Recent Poor; Remote Poor  Judgment: -- (Unable to assess)  Insight: -- (Unable to assess)   Executive Functions  Concentration: Poor  Attention Span: Poor  Recall: Poor  Fund of Knowledge: Poor  Language: Other (comment) (not responding to questions.)   Psychomotor Activity  Psychomotor Activity:No data recorded  Assets  Assets: Intimacy; Housing   Sleep  Sleep: Sleep: Fair    Physical Exam: Physical Exam ROS Blood pressure (!) 155/97, pulse 70, temperature 98.1 F (36.7 C), temperature source Axillary, resp. rate 16, height 5\' 6"  (1.676 m), weight 72.6 kg, SpO2 95%. Body mass index is 25.82 kg/m.   Medical Decision Making: Patient remains awake, alert and restless.  Some times he answers question with yes and no.  Seroquel is scheduled 12.5 mg twice a day for agitation and restlessness is discontinued due to QTC  Rate of 511.  We also have patient on PRN Agitation protocol.  Provider will discuss addition of Depakote sprinkle to regimen with wife when she comes in for visit.   UA is negative. Disposition:  Continue Monitoring as patient is still restless and was just off Restraint twenty minutes ago. Earney Navy, NP-PMHNP-BC 06/28/2023, 10:37 AM

## 2023-06-28 NOTE — ED Notes (Signed)
Pt still agitated

## 2023-06-28 NOTE — ED Notes (Signed)
Pt has not has any urine/bowel all day.  Have toileted the pt every 2 hours without success, EDP notified

## 2023-06-29 ENCOUNTER — Encounter: Payer: Self-pay | Admitting: Cardiology

## 2023-06-29 ENCOUNTER — Emergency Department (HOSPITAL_COMMUNITY): Payer: No Typology Code available for payment source

## 2023-06-29 MED ORDER — DIVALPROEX SODIUM 125 MG PO CSDR
125.0000 mg | DELAYED_RELEASE_CAPSULE | Freq: Three times a day (TID) | ORAL | Status: DC
Start: 2023-06-29 — End: 2023-07-09
  Administered 2023-06-29 – 2023-07-08 (×26): 125 mg via ORAL
  Filled 2023-06-29 (×27): qty 1

## 2023-06-29 NOTE — ED Notes (Signed)
Patient ate a couple of bites of sausage from the sausage biscuit, applesauce and apple juice. He took his pills crush in applesauce.

## 2023-06-29 NOTE — Progress Notes (Signed)
CSW spoke with pt's daughter, Enzo Bi [phone]) to discuss placement options for memory care. CSW offered pt's daughter a referral to [facility] and daughter agreed. CSW referred pt to Jeannett Senior with [facility] who reports he will contact pt's wife and daughter to coordinate an assessment and discuss home care options until pt is able to transition into memory care.

## 2023-06-29 NOTE — [place] FL2 (Signed)
[place] MEDICAID FL2 LEVEL OF CARE FORM     IDENTIFICATION  Patient Name: Nathan Mcdaniel Birthdate: 06-Nov-1940 Sex: male Admission Date (Current Location): 06/27/2023  [facility] and [place] Number:  Producer, television/film/video and Address:  [facility],  [address] [place], [place] [address]      Provider Number: [id number]  Attending Physician Name and Address:  System, Provider Not In  Relative Name and Phone Number:       Current Level of Care: Hospital Recommended Level of Care: Memory Care Prior Approval Number:    Date Approved/Denied:   PASRR Number:    Discharge Plan: Other (Comment) (memory care)    Current Diagnoses: Patient Active Problem List   Diagnosis Date Noted   Dementia with behavioral disturbance (HCC) 06/27/2023   Hematoma of left lower leg 01/28/2023   Gross hematuria 02/19/2020   Mild neurocognitive disorder, 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   Major depressive disorder    Generalized anxiety disorder    Long term (current) use of anticoagulants 12/17/2017   Mobitz type 1 second degree atrioventricular block    Abnormal ECG    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   Atrial fibrillation 10/14/2011   Hypertension    Palpitations    Mitral insufficiency    Elevated prostate specific antigen (PSA) 11/26/2010   Pure hypercholesterolemia 10/07/2010   Asymptomatic varicose veins 10/07/2010   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   Vitamin D deficiency 10/07/2010   Allergic rhinitis 10/24/2009   Bunion 05/21/2009   Corn or callus 12/26/2008   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    Orientation RESPIRATION BLADDER Height & Weight     Self  Normal Incontinent Weight: 72.6 kg Height:  5\' 6"  (167.6 cm)  BEHAVIORAL SYMPTOMS/MOOD NEUROLOGICAL BOWEL NUTRITION STATUS      Incontinent Diet  AMBULATORY STATUS COMMUNICATION OF NEEDS Skin   Limited Assist Verbally Normal                       Personal  Care Assistance Level of Assistance  Feeding, Dressing, Bathing Bathing Assistance: Maximum assistance Feeding assistance: Independent Dressing Assistance: Limited assistance     Functional Limitations Info             SPECIAL CARE FACTORS FREQUENCY                       Contractures Contractures Info: Not present    Additional Factors Info  Code Status, Allergies Code Status Info: Full Allergies Info: aricept, namenda, olanzapine           Current Medications (06/29/2023):  This is the current hospital active medication list Current Facility-Administered Medications  Medication Dose Route Frequency Provider Last Rate Last Admin   acetaminophen (TYLENOL) tablet 650 mg  650 mg Oral Q4H PRN Fayrene Helper, PA-C   650 mg at 06/28/23 1541   alum & mag hydroxide-simeth (MAALOX/MYLANTA) 200-200-20 MG/5ML suspension 30 mL  30 mL Oral Q6H PRN Fayrene Helper, PA-C       divalproex (DEPAKOTE SPRINKLE) capsule 125 mg  125 mg Oral TID Ophelia Shoulder E, NP       escitalopram (LEXAPRO) tablet 20 mg  20 mg Oral q AM Fayrene Helper, PA-C   20 mg at 06/29/23 1155   gabapentin (NEURONTIN) capsule 300 mg  300 mg Oral Daily Fayrene Helper, PA-C   300 mg at 06/29/23 1155   LORazepam (ATIVAN) tablet 0.5 mg  0.5 mg  Oral Q8H PRN Dahlia Byes C, NP   0.5 mg at 06/29/23 1248   ondansetron (ZOFRAN) tablet 4 mg  4 mg Oral Q8H PRN Fayrene Helper, PA-C       risperiDONE (RISPERDAL M-TABS) disintegrating tablet 1 mg  1 mg Oral Q8H PRN Dahlia Byes C, NP   1 mg at 06/29/23 1248   warfarin (COUMADIN) tablet 2.5 mg  2.5 mg Oral q1600 Fayrene Helper, PA-C   2.5 mg at 06/28/23 1642   Warfarin - Physician Dosing Inpatient   Does not apply q1600 Fayrene Helper, PA-C   Given at 06/28/23 1642   Current Outpatient Medications  Medication Sig Dispense Refill   acetaminophen (TYLENOL) 325 MG tablet Take 650 mg by mouth every 6 (six) hours as needed for moderate pain or headache.     LORazepam (ATIVAN) 0.5 MG tablet Take  0.5 mg by mouth as needed for anxiety or sedation.     Multiple Vitamin (MULTIVITAMIN WITH MINERALS) TABS tablet Take 1 tablet by mouth in the morning.     OLANZapine zydis (ZYPREXA) 5 MG disintegrating tablet Take 5 mg by mouth daily.     tamsulosin (FLOMAX) 0.4 MG CAPS capsule Take 0.4 mg by mouth at bedtime.     warfarin (COUMADIN) 2.5 MG tablet Take 2.5 mg by mouth every evening.     empagliflozin (JARDIANCE) 10 MG TABS tablet TAKE 1 TABLET BY MOUTH DAILY BEFORE BREAKFAST. 90 tablet 3   escitalopram (LEXAPRO) 20 MG tablet Take 20 mg by mouth in the morning. (Patient not taking: Reported on 06/27/2023)     gabapentin (NEURONTIN) 300 MG capsule Take 1 capsule (300 mg total) by mouth daily. (Patient not taking: Reported on 06/27/2023) 30 capsule 0   levocetirizine (XYZAL) 5 MG tablet Take by mouth. (Patient not taking: Reported on 06/27/2023)     pregabalin (LYRICA) 75 MG capsule Take by mouth. (Patient not taking: Reported on 06/27/2023)       Discharge Medications: Please see discharge summary for a list of discharge medications.  Relevant Imaging Results:  Relevant Lab Results:   Additional Information SSN [id number]  Armanda Heritage, RN

## 2023-06-29 NOTE — Consult Note (Signed)
Interval Progress Note 06/29/2023: Nathan Mcdaniel, 82 y.o., male patient with hx for dementia was brought to the emergency department from home for evaluation of aggressive behaviors.  Patient was evaluated by psychiatry services on 06/28/2023; medication adjustments were made, including stopping seroquel d/t prolonged QT intervals.  Depakote sprinkles was to be added but deferred until consent could be gained from patients wife.    Attempted to see patient today for psych reassessment via tts cart.  Per nurse, patient, will not speak. They have been trying to keep him calm for the past hour but he's kicking, hitting, biting and spitting. He has not been responsive to verbal redirection. Per chart review, patient was agitated last night and required prn medications and physical restraints.   Spoke with patient's wife, Nathan Mcdaniel, via telephone call.  She reports her husband declined rapidly and she was unaware how severe it was until he started escaping their home and running out the door.  She reports he was dx with dementia years ago by Nathan Mcdaniel, the neurologisit with [facility] in [place], [place].  States Nathan Mcdaniel changed his area of specialty and could Mcdaniel longer see patient; Since then she reports he's been lost to follow up.  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  She states se would like services set up so her husband can remain at  home.  States she has been trying to coordinate his care with PCP Nathan Mcdaniel, for outpatient neurology and psychiatry referral but has had difficulty reaching someone at the [place].  She was able to reach someone in [facility] services, and historically, they are now working to get aide and attendance services set up.  [place].  Last appt was few months.  She would like to get home services set up to allow her husband to remain at home.  We discussed starting divalproex sprinkle 125mg  po TID for mood stability; reviewed the intended  therapeutic response and potential side effects. She agrees to start medication.   Mcdaniel: -Start divalproex 125mg  po TID for mood stability.  -Continue other medications as ordered, Mcdaniel changes. -Will ree-valuate in AM to determine is agitation has subsided for him to go home.   -Patient's wife is aware that dementia is a progressive, chronic illness and patient is expected to have intermittent bouts of impulsive behaviors and irritability.   -TOC has already provided resources for memory care unit.  They are also trying to assist patient with reaching Nathan Mcdaniel for PCP assistance.

## 2023-06-29 NOTE — ED Provider Notes (Signed)
Emergency Medicine Observation Re-evaluation Note  Nathan Mcdaniel is a 82 y.o. male, seen on rounds today.  Pt initially presented to the ED for complaints of Altered Mental Status Currently, the patient is asleep.  Patient was agitated in the middle the night, required medications to calm him down.  Patient had restraints applied at that time.  Physical Exam  BP (!) 111/50 (BP Location: Left Arm)   Pulse 87   Temp 97.8 F (36.6 C) (Axillary)   Resp 16   Ht 5\' 6"  (1.676 m)   Wt 72.6 kg   SpO2 94%   BMI 25.82 kg/m  Physical Exam General: No acute distress Cardiac: Regular rate Lungs: No respiratory distress Psych: Currently asleep  ED Course / MDM  EKG:EKG Interpretation Date/Time:  Saturday June 27 2023 10:58:08 EST Ventricular Rate:  104 PR Interval:    QRS Duration:  125 QT Interval:  388 QTC Calculation: 511 R Axis:   -2  Text Interpretation: Normal sinus rhythm Ventricular bigeminy Left bundle branch block No significant change since last tracing Confirmed by Elayne Snare ([id number]) on 06/27/2023 11:02:00 AM  I have reviewed the labs performed to date as well as medications administered while in observation.  Recent changes in the last 24 hours include -patient continues to have some episodes of agitation.  Required sedation last night.  Placed on restraint. No concerns from nursing staff at this time.  I have requested at 8:15 AM for patient's restraints to be removed.  Plan  Current plan is for continue expectant management.    Derwood Kaplan, MD 06/29/23 [phone]

## 2023-06-30 DIAGNOSIS — F03918 Unspecified dementia, unspecified severity, with other behavioral disturbance: Secondary | ICD-10-CM | POA: Diagnosis not present

## 2023-06-30 NOTE — ED Notes (Signed)
Pt keeps trying to get out of the bed and chair.

## 2023-06-30 NOTE — Consult Note (Signed)
Interval Progress Note 06/30/2023: Nathan Mcdaniel, 82 y.o., male patient with hx for dementia was brought to the emergency department from home for evaluation of aggressive behaviors.  Patient was evaluated by psychiatry services on 06/28/2023; medication adjustments were made, including stopping seroquel d/t prolonged QT intervals.  Depakote sprinkles was to be added but deferred until consent could be gained from patients wife.    Patient seen and assessed by this nurse practitioner. Patient was able to say good morning, in response to my good morning. Otherwise he smiled throughout the re-assessment and kept making attempts to get out of bed. He is easily redirectable, and never became agitated. At times he was able to verbally communicate things such as bathroom, or in one instance he dropped something on the floor and bent over to pick it up. He has not assaulted anyone today or displayed any disruptive behaviors. This provider was in and out the ED majority of the day and patient has been content, calm and cooperative. He remains verbally limited, unclear of his baseline.   Per staff, a facility came to visit him today and responded well.    Plan: -COntinue divalproex 125mg  po TID for mood stability.  -Continue other medications as ordered, no changes. -Will ree-valuate in AM to determine is agitation has subsided for him to go home.   -Patient's wife is aware that dementia is a progressive, chronic illness and patient is expected to have intermittent bouts of impulsive behaviors and irritability.   -TOC has already provided resources for memory care unit.  They are also trying to assist patient with reaching [facility] for PCP assistance.

## 2023-06-30 NOTE — ED Notes (Signed)
Patient was calm, cooperative and redirectable throughout the shift.

## 2023-06-30 NOTE — ED Notes (Signed)
Patient slept well overnight. If he starting moving around in bed, he has to use the bathroom and needs 2 assist to keep him safe and from falling.

## 2023-06-30 NOTE — ED Notes (Signed)
Patient is very unsteady on his feet. It takes at least 2 staff to take him to the bathroom. After taking him to the [facility] this time, he stated "thank you" to [place], [place] and I.

## 2023-06-30 NOTE — ED Notes (Signed)
Reps from [facility] memory care came to evaluate. MD notified about their request to have Coumadin changed to Eliquis.

## 2023-07-01 DIAGNOSIS — F03918 Unspecified dementia, unspecified severity, with other behavioral disturbance: Secondary | ICD-10-CM | POA: Diagnosis not present

## 2023-07-01 LAB — PROTIME-INR
INR: 4.6 (ref 0.8–1.2)
Prothrombin Time: 43.7 s — ABNORMAL HIGH (ref 11.4–15.2)

## 2023-07-01 MED ORDER — WARFARIN SODIUM 2.5 MG PO TABS: 2.5000 mg | ORAL_TABLET | Freq: Every day | ORAL | Status: DC

## 2023-07-01 MED ORDER — WARFARIN SODIUM 2.5 MG PO TABS: 2.5000 mg | ORAL_TABLET | Freq: Every evening | ORAL | Status: DC

## 2023-07-01 NOTE — ED Notes (Addendum)
Patient had three visitors today. His wife, sister, and daughter.

## 2023-07-01 NOTE — Consult Note (Addendum)
Interval Progress Note 06/30/2023: Nathan Mcdaniel, 82 y.o., male patient with hx for dementia was brought to the emergency department from home for evaluation of aggressive behaviors.  Patient was evaluated by psychiatry services on 06/28/2023; medication adjustments were made, including stopping seroquel d/t prolonged QT intervals.  Depakote sprinkles was to be added but deferred until consent could be gained from patients wife.    Patient seen and assessed by this nurse practitioner. This provider was in and out the ED majority of the day and patient has been content, calm and cooperative. He remains verbally limited, unclear of his baseline.   Overall symptom improvement has been noticed since starting Depakote. He is observed to be resting on todays assessment. At this current time, patient's pre-existing condition that was substantially aggravated prior to this admission has returned to previous level.  At this time it is felt that patient has returned to psychiatric baseline, and will be stable to discharge to facility or home. He denies suicidal ideation, homicidal ideation, and or auditory or visual hallucinations. He has not displayed any disruptive behaviors, agitation, or aggression in over 72 hours.   Plan: -COntinue divalproex 125mg  po TID for mood stability.  Valproic acid level, cbc, ammonia, and cmp ordered for tomorrow. Suspect it will be low, and primary goal is symptom management of agitation and aggression. Will not treat to goal.  -Continue other medications as ordered, no changes. -Patient's wife is aware that dementia is a progressive, chronic illness and patient is expected to have intermittent bouts of impulsive behaviors and irritability.   -TOC has already provided resources for memory care unit.  They are also trying to assist patient with reaching [facility] for PCP assistance.   TTS consult service to sign off please re-consult if further medication management is  needed.

## 2023-07-01 NOTE — ED Provider Notes (Signed)
Patient with mildly elevated INR at 4.6.  We will hold his Coumadin for today and tomorrow   Bethann Berkshire, MD 07/01/23 1259

## 2023-07-01 NOTE — ED Notes (Addendum)
Pt family called, inquiring about visit for today. Daughter Lanora Manis was advised that visitation will be over by 8pm. Okay to have 3 visits today but individual. Pt daughter agreed.

## 2023-07-01 NOTE — ED Notes (Signed)
Provider Dr. Estell Harpin wants to hold the next two doses of Coumadin due to PT/INR of 4.6

## 2023-07-01 NOTE — Progress Notes (Signed)
CSW spoke with pt's daughter, Turkey, who informed she was unaware that pt was psychiatrically cleared. Pt's daughter states she and pt's spouse are unable to care for the pt in the home. Daughter reports her mother is 84 years old and cannot manage him. Daughter states she, her mother, nor her sister have the funds to private pay for 24/7 home care. Daughter reports they have spoken with [facility] who will complete an assessment on Monday.

## 2023-07-01 NOTE — Progress Notes (Signed)
PHARMACY - ANTICOAGULATION CONSULT NOTE  Pharmacy Consult for Warfarin Indication: mechanical AVR  Allergies  Allergen Reactions   Aricept [Donepezil Hcl] Other (See Comments)    Hallucinations    Namenda [Memantine Hcl] Other (See Comments)    Hallucinations    Olanzapine Anxiety    Agitation, aggression.    Patient Measurements: Height: 5\' 6"  (167.6 cm) Weight: 72.6 kg (160 lb) IBW/kg (Calculated) : 63.8  Vital Signs: Temp: 98.2 F (36.8 C) (11/27 1346) Temp Source: Oral (11/27 1346) BP: 143/83 (11/27 1346) Pulse Rate: 63 (11/27 1346)  Labs: Recent Labs    06/28/23 1634 07/01/23 1149  HGB 13.3  --   HCT 40.9  --   PLT 227  --   LABPROT  --  43.7*  INR  --  4.6*  CREATININE 0.96  --     Estimated Creatinine Clearance: 51.7 mL/min (by C-G formula based on SCr of 0.96 mg/dL).   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   Mitral insufficiency    Mobitz type 1 second degree atrioventricular block    Palpitations    Pseudoaneurysm    of forehead   Pure hypercholesterolemia 10/07/2010   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   Stroke 06/09/2018   Brain MRI revealed two remote tiny infarcts in the left cerebellum   Vitamin D deficiency 10/07/2010    Medications:  (Not in a hospital admission)  Scheduled:   divalproex  125 mg Oral TID   escitalopram  20 mg Oral q AM   gabapentin  300 mg Oral Daily    PRN: acetaminophen, alum & mag hydroxide-simeth, LORazepam, ondansetron, risperiDONE **AND** [COMPLETED] LORazepam **AND** [COMPLETED] ziprasidone  Assessment: 3 yoM with PMH dementia, HTN, Afib and mech AVR on chronic warfarin, presents with behavioral disturbances. MD initially continued home warfarin regimen with MWF INR monitoring, but INR elevated today, and Pharmacy consulted to assume management hereafter.   Baseline INR elevated Prior anticoagulation: warfarin 2.5 mg daily; last dose 11/26  Significant events:  Today, 07/01/2023: CBC: WNL on 11/24 INR now SUPRAtherapeutic Major drug interactions: none noted No bleeding issues per nursing PO intake not consistently charted  Goal of Therapy: INR 2-3  Plan: Hold warfarin today Daily INR CBC at least q72 hr while on warfarin - will check tomorrow Monitor for signs of bleeding or thrombosis   Bernadene Person, PharmD, BCPS [phone] 07/01/2023, 4:18 PM

## 2023-07-01 NOTE — Progress Notes (Signed)
CSW attempted to contact pt's daughter and spouse without success. Left HIPAA Compliant voicemail requesting call back.

## 2023-07-01 NOTE — ED Provider Notes (Signed)
Emergency Medicine Observation Re-evaluation Note  Nathan Mcdaniel is a 82 y.o. male, seen on rounds today.  Pt initially presented to the ED for complaints of Altered Mental Status Currently, the patient is having dementia treated and awaiting memory care unit.  Physical Exam  BP (!) 157/94 (BP Location: Left Arm)   Pulse 75   Temp 97.9 F (36.6 C) (Oral)   Resp 16   Ht 5\' 6"  (1.676 m)   Wt 72.6 kg   SpO2 98%   BMI 25.82 kg/m  Physical Exam Alert and in no acute distress  ED Course / MDM  EKG:EKG Interpretation Date/Time:  Saturday June 27 2023 10:58:08 EST Ventricular Rate:  104 PR Interval:    QRS Duration:  125 QT Interval:  388 QTC Calculation: 511 R Axis:   -2  Text Interpretation: Normal sinus rhythm Ventricular bigeminy Left bundle branch block No significant change since last tracing Confirmed by Elayne Snare ([id number]) on 06/27/2023 11:02:00 AM  I have reviewed the labs performed to date as well as medications administered while in observation.  Recent changes in the last 24 hours include none.  Plan  Current plan is for memory care unit.    Bethann Berkshire, MD 07/01/23 0730

## 2023-07-01 NOTE — ED Notes (Signed)
Pt is restless attempting to ambulate w/o assist , requires multiple redirections from staff but appears to comprehend very little of what is said to him by staff. Pt gait is not steady and requires assist of at least 1 staff at all time as he has difficulty following staff direction.

## 2023-07-02 LAB — CBC
HCT: 46.2 % (ref 39.0–52.0)
Hemoglobin: 14.3 g/dL (ref 13.0–17.0)
MCH: 28.4 pg (ref 26.0–34.0)
MCHC: 31 g/dL (ref 30.0–36.0)
MCV: 91.7 fL (ref 80.0–100.0)
Platelets: 252 10*3/uL (ref 150–400)
RBC: 5.04 MIL/uL (ref 4.22–5.81)
RDW: 14.6 % (ref 11.5–15.5)
WBC: 6.2 10*3/uL (ref 4.0–10.5)
nRBC: 0 % (ref 0.0–0.2)

## 2023-07-02 LAB — COMPREHENSIVE METABOLIC PANEL
ALT: 14 U/L (ref 0–44)
AST: 21 U/L (ref 15–41)
Albumin: 3.8 g/dL (ref 3.5–5.0)
Alkaline Phosphatase: 55 U/L (ref 38–126)
Anion gap: 9 (ref 5–15)
BUN: 25 mg/dL — ABNORMAL HIGH (ref 8–23)
CO2: 28 mmol/L (ref 22–32)
Calcium: 8.6 mg/dL — ABNORMAL LOW (ref 8.9–10.3)
Chloride: 101 mmol/L (ref 98–111)
Creatinine, Ser: 1 mg/dL (ref 0.61–1.24)
GFR, Estimated: >60 mL/min (ref >60)
Glucose, Bld: 100 mg/dL — ABNORMAL HIGH (ref 70–99)
Potassium: 4.3 mmol/L (ref 3.5–5.1)
Sodium: 138 mmol/L (ref 135–145)
Total Bilirubin: 1.1 mg/dL (ref <1.2)
Total Protein: 6.2 g/dL — ABNORMAL LOW (ref 6.5–8.1)

## 2023-07-02 LAB — AMMONIA: Ammonia: <10 umol/L (ref 9–35)

## 2023-07-02 LAB — VALPROIC ACID LEVEL: Valproic Acid Lvl: 24 ug/mL — ABNORMAL LOW (ref 50.0–100.0)

## 2023-07-02 LAB — PROTIME-INR
INR: 3.6 — ABNORMAL HIGH (ref 0.8–1.2)
Prothrombin Time: 36.4 s — ABNORMAL HIGH (ref 11.4–15.2)

## 2023-07-02 MED ORDER — HALOPERIDOL LACTATE 5 MG/ML IJ SOLN
2.5000 mg | Freq: Once | INTRAMUSCULAR | Status: AC
  Administered 2023-07-02: 2.5 mg via INTRAMUSCULAR
  Filled 2023-07-02: qty 1

## 2023-07-02 MED ORDER — WARFARIN - PHARMACIST DOSING INPATIENT
Freq: Every day | Status: DC
Start: 2023-07-02 — End: 2023-07-10

## 2023-07-02 MED ORDER — WARFARIN SODIUM 2.5 MG PO TABS
2.5000 mg | ORAL_TABLET | Freq: Every day | ORAL | Status: AC
  Administered 2023-07-02: 2.5 mg via ORAL
  Filled 2023-07-02: qty 1

## 2023-07-02 NOTE — Progress Notes (Signed)
PHARMACY - ANTICOAGULATION CONSULT NOTE  Pharmacy Consult for Coumadin Indication: mechanical MVR, + afib with h/o CVAs   Allergies  Allergen Reactions   Aricept [Donepezil Hcl] Other (See Comments)    Hallucinations    Namenda [Memantine Hcl] Other (See Comments)    Hallucinations    Olanzapine Anxiety    Agitation, aggression.    Patient Measurements: Height: 5\' 6"  (167.6 cm) Weight: 72.6 kg (160 lb) IBW/kg (Calculated) : 63.8 Heparin Dosing Weight:   Vital Signs: Temp: 97.5 F (36.4 C) (11/28 0645) Temp Source: Oral (11/28 0645) BP: 126/71 (11/28 0645) Pulse Rate: 78 (11/28 0645)  Labs: Recent Labs    07/01/23 1149 07/02/23 0759  LABPROT 43.7* 36.4*  INR 4.6* 3.6*  CREATININE  --  1.00    Estimated Creatinine Clearance: 49.6 mL/min (by C-G formula based on SCr of 1 mg/dL).   Medical History:  Assessment:  AC/Heme: hx mechanical MVR, + afib with h/o CVAs on warfarin PTA  - Admit INR 3.1 on 2.5mg  daily - Goal 2.5-3.5 - CBC-IP today - INR  4.6>3.6  Goal of Therapy:  INR 2.5-3.5 Monitor platelets by anticoagulation protocol: Yes   Plan:  Resume Coumadin 2.5mg  po daily. Daily INR   Lelah Rennaker S. Merilynn Finland, PharmD, BCPS Clinical Staff Pharmacist Misty Stanley Stillinger 07/02/2023,9:20 AM

## 2023-07-02 NOTE — ED Provider Notes (Signed)
Emergency Medicine Observation Re-evaluation Note  Nathan Mcdaniel is a 82 y.o. male, seen on rounds today.  Pt initially presented to the ED for complaints of Altered Mental Status Currently, the patient is sitting in a chair, smiling and cooperative at this time.  Physical Exam  BP 126/71 (BP Location: Left Arm)   Pulse 78   Temp (!) 97.5 F (36.4 C) (Oral)   Resp 18   Ht 5\' 6"  (1.676 m)   Wt 72.6 kg   SpO2 98%   BMI 25.82 kg/m  Physical Exam General: NAD Cardiac: regular rate Lungs: clear Psych: cooperative  ED Course / MDM  EKG:EKG Interpretation Date/Time:  Saturday June 27 2023 10:58:08 EST Ventricular Rate:  104 PR Interval:    QRS Duration:  125 QT Interval:  388 QTC Calculation: 511 R Axis:   -2  Text Interpretation: Normal sinus rhythm Ventricular bigeminy Left bundle branch block No significant change since last tracing Confirmed by Elayne Snare ([id number]) on 06/27/2023 11:02:00 AM  I have reviewed the labs performed to date as well as medications administered while in observation.  Recent changes in the last 24 hours include InR is elevated 4.6 and coumadin held and being redrawn as well as depakote.  Plan  Current plan is for follow up on InR and depakote level.  Still seeking memory care.   9:30 AM INR improving and now 3.6.  will hold another day of coumadin and recheck tomorrow.  Depakote is low at 24.  Ammonia normal.   Gwyneth Sprout, MD 07/02/23 1500

## 2023-07-02 NOTE — ED Notes (Signed)
Patient currently resting in bed quietly after having breakfast. Was a bit fidgety this morning touching cords, walking out of room, and trying to go back and forth in the bathroom.

## 2023-07-02 NOTE — ED Notes (Signed)
Spoke with pharmacist requesting to continue Coumadin at this time.

## 2023-07-03 LAB — CBC
HCT: 38.9 % — ABNORMAL LOW (ref 39.0–52.0)
Hemoglobin: 12.6 g/dL — ABNORMAL LOW (ref 13.0–17.0)
MCH: 29 pg (ref 26.0–34.0)
MCHC: 32.4 g/dL (ref 30.0–36.0)
MCV: 89.6 fL (ref 80.0–100.0)
Platelets: 259 10*3/uL (ref 150–400)
RBC: 4.34 MIL/uL (ref 4.22–5.81)
RDW: 14.5 % (ref 11.5–15.5)
WBC: 9.8 10*3/uL (ref 4.0–10.5)
nRBC: 0 % (ref 0.0–0.2)

## 2023-07-03 LAB — PROTIME-INR
INR: 3.8 — ABNORMAL HIGH (ref 0.8–1.2)
Prothrombin Time: 38 s — ABNORMAL HIGH (ref 11.4–15.2)

## 2023-07-03 NOTE — ED Notes (Addendum)
Pt started trying to come out of bed again he had to be put in restraint of posey belt.

## 2023-07-03 NOTE — Progress Notes (Signed)
PHARMACY - ANTICOAGULATION CONSULT NOTE  Pharmacy Consult for Coumadin Indication: mechanical MVR, + afib with h/o CVAs   Allergies  Allergen Reactions   Aricept [Donepezil Hcl] Other (See Comments)    Hallucinations    Namenda [Memantine Hcl] Other (See Comments)    Hallucinations    Olanzapine Anxiety    Agitation, aggression.    Patient Measurements: Height: 5\' 6"  (167.6 cm) Weight: 72.6 kg (160 lb) IBW/kg (Calculated) : 63.8  Vital Signs: Temp: 98.4 F (36.9 C) (11/29 0606) Temp Source: Oral (11/29 0606) BP: 104/61 (11/29 0606) Pulse Rate: 59 (11/29 0606)  Labs: Recent Labs    07/01/23 1149 07/02/23 0759 07/03/23 0611  HGB  --  14.3  --   HCT  --  46.2  --   PLT  --  252  --   LABPROT 43.7* 36.4* 38.0*  INR 4.6* 3.6* 3.8*  CREATININE  --  1.00  --     Estimated Creatinine Clearance: 49.6 mL/min (by C-G formula based on SCr of 1 mg/dL).  Assessment: 79 yoM with PMH dementia, HTN, Afib and mech AVR on chronic warfarin, presents with behavioral disturbances. MD initially continued home warfarin regimen with MWF INR monitoring, but INR elevated today, and Pharmacy consulted to assume management hereafter.    Prior anticoagulation: warfarin 2.5 mg daily; last dose prior to admission 11/26 Admit INR 3.1 (11/23) on 2.5mg  daily (goal INR 2.5-3.5). Patient's 2.5mg  daily dose was continued inpatient and INR was ordered every 3 days. Next INR on 11/27 was elevated at 4.6 >> warfarin held >> INR down to 3.6 the following day >> warfarin resumed.  Today, 07/03/23: CBC from 11/28 Hgb 14.3--stable Plts 252--stable CBC today in process INR  3.6 >> 3.8 from yesterday after warfarin 2.5mg  dose yesterday  Goal of Therapy:  INR 2.5-3.5 Monitor platelets by anticoagulation protocol: Yes   Plan:  Hold warfarin today F/u CBC  Daily INR   Cherylin Mylar, PharmD Clinical Pharmacist  11/29/20249:54 AM

## 2023-07-03 NOTE — ED Notes (Signed)
Call received at this time from pt spouse requesting update on pt, and information provided. Informed that [facility] to see pt Monday 07/06/23 for evaluation for placement to Memory Care Unit. Informed by wife pt used to work as an Art gallery manager in [employer] and traveled frequently to the [employer], that pt is fluent in arabic, but also speaks some spanish because his mother was Hispanic and spoke spanish with him. Pt able to be verbally redirected to bed by sitter at this time without any issues. Will discuss with ER provider regarding skin TB test orders for possible SNF placement next week to prevent placement delays, if not already completed.

## 2023-07-03 NOTE — ED Provider Notes (Addendum)
Emergency Medicine Observation Re-evaluation Note  Nathan Mcdaniel is a 82 y.o. male, seen on rounds today.  Pt initially presented to the ED for complaints of Altered Mental Status Currently, the patient is resting.  Physical Exam  BP 104/61 (BP Location: Left Arm)   Pulse (!) 59   Temp 98.4 F (36.9 C) (Oral)   Resp 16   Ht 1.676 m (5\' 6" )   Wt 72.6 kg   SpO2 99%   BMI 25.82 kg/m  Physical Exam General: WDWN Cardiac: HR normal at 59 Lungs: no distress-rr 16 Psych: resting in chair, attempts conversation but has some garbled speech  ED Course / MDM  EKG:EKG Interpretation Date/Time:  Saturday June 27 2023 10:58:08 EST Ventricular Rate:  104 PR Interval:    QRS Duration:  125 QT Interval:  388 QTC Calculation: 511 R Axis:   -2  Text Interpretation: Normal sinus rhythm Ventricular bigeminy Left bundle branch block No significant change since last tracing Confirmed by Elayne Snare ([id number]) on 06/27/2023 11:02:00 AM  I have reviewed the labs performed to date as well as medications administered while in observation.  Recent changes in the last 24 hours include Labs reviewed with normal cbc, cmet with mild hypocalcemia, and low protein, inr therapeutic.  Plan  Current plan is for memory care unit.RN reports patient will be transferred to memory care on Monday.    Margarita Grizzle, MD 07/03/23 1610    Margarita Grizzle, MD 07/03/23 [phone]

## 2023-07-03 NOTE — ED Notes (Signed)
Pt not currently in restraints. Order d/c.

## 2023-07-03 NOTE — ED Notes (Signed)
Patient is confused.  Nonsensical.Difficult to understand.   Needs assist with ADLs.  Patient was medication compliant this shift.  Patient did rest this shift.

## 2023-07-04 LAB — PROTIME-INR
INR: 3.6 — ABNORMAL HIGH (ref 0.8–1.2)
Prothrombin Time: 36.5 s — ABNORMAL HIGH (ref 11.4–15.2)

## 2023-07-04 MED ORDER — WARFARIN SODIUM 1 MG PO TABS
1.5000 mg | ORAL_TABLET | Freq: Once | ORAL | Status: AC
  Administered 2023-07-04: 1.5 mg via ORAL
  Filled 2023-07-04: qty 1

## 2023-07-04 NOTE — ED Notes (Addendum)
This nurse was told in report that when patient was given medications, patient spit back out.  This nurse emptied capsules into applesauce, crushed pill and placed in applesauce , patient cooperatively  took medications until last bite. Nurse informed patient that patient's wife was on the way and patient must finish medication/applesauce. Patient agreed and finished medication.

## 2023-07-04 NOTE — ED Notes (Signed)
Another daughter is at bedside at this time.

## 2023-07-04 NOTE — ED Notes (Signed)
Pt refusing to eat his dinner tonight, and refusing to take medications for this nurse. Pt reports "I will do it later." Pt now appears to be resting comfortably with eyes closed, respirations even and unlabored.

## 2023-07-04 NOTE — ED Notes (Addendum)
Patient's daughter, Benetta Spar, present to visit with patient. States patient's wife will arrive in a few minutes.

## 2023-07-04 NOTE — ED Notes (Signed)
Pt woke up trying to get out of bed. Took pt to bathroom to void. 1x assist. Pt resting comfortably in bed at this time.

## 2023-07-04 NOTE — Progress Notes (Signed)
PHARMACY - ANTICOAGULATION CONSULT NOTE  Pharmacy Consult for Coumadin Indication: mechanical MVR, + afib with h/o CVAs   Allergies  Allergen Reactions   Aricept [Donepezil Hcl] Other (See Comments)    Hallucinations    Namenda [Memantine Hcl] Other (See Comments)    Hallucinations    Olanzapine Anxiety    Agitation, aggression.    Patient Measurements: Height: 5\' 6"  (167.6 cm) Weight: 72.6 kg (160 lb) IBW/kg (Calculated) : 63.8  Vital Signs: Temp: 97.6 F (36.4 C) (11/30 0649) Temp Source: Axillary (11/30 0649) BP: 93/59 (11/30 0649) Pulse Rate: 80 (11/30 0649)  Labs: Recent Labs    07/02/23 0759 07/03/23 0611 07/03/23 1154 07/04/23 1154  HGB 14.3 12.6*  --   --   HCT 46.2 38.9*  --   --   PLT 252 259  --   --   LABPROT 36.4* 38.0* 36.5* 36.5*  INR 3.6* 3.8* 3.6* 3.6*  CREATININE 1.00  --   --   --     Estimated Creatinine Clearance: 49.6 mL/min (by C-G formula based on SCr of 1 mg/dL).  Assessment: 49 yoM with PMH dementia, HTN, Afib and mech AVR on chronic warfarin, presents with behavioral disturbances. MD initially continued home warfarin regimen with MWF INR monitoring, but INR elevated today, and Pharmacy consulted to assume management hereafter.    Prior anticoagulation: warfarin 2.5 mg daily; last dose prior to admission 11/26 Admit INR 3.1 (11/23) on 2.5mg  daily (goal INR 2.5-3.5). Patient's 2.5mg  daily dose was continued inpatient and INR was ordered every 3 days. Next INR on 11/27 was elevated at 4.6 >> warfarin held >> INR down to 3.6 the following day >> warfarin resumed.  Today, 07/04/23: CBC from 11/29 Hgb decreased to 12.6 Plts 259K, WNL INR  3.8 yesterday >> 3.6 today after yesterday's warfarin dose was held (per discussion with lab, INR documented in [facility] from 11/29 at 1154 was erroneous, lab received inappropriately, actual INR result of 3.6 was based on today's 11/30 collection at 1154) Of note, patient was initiated on divalproex sprinkles  125mg  PO TID on 11/25, which may increase INR  Goal of Therapy:  INR 2.5-3.5 Monitor platelets by anticoagulation protocol: Yes   Plan:  Warfarin 1.5mg  PO x 1 today  Daily INR CBC at least q72h while hospitalized     Nathan Mcdaniel, PharmD, BCPS Clinical Pharmacist 07/04/2023 2:11 PM

## 2023-07-04 NOTE — ED Notes (Signed)
Patient's daughter Turkey and wife Wynona Canes left at this time

## 2023-07-04 NOTE — ED Notes (Signed)
Update provided to pt daughter Larene Beach who called in. All questions, concerns addressed at this time.

## 2023-07-04 NOTE — Progress Notes (Signed)
[facility] will be assessing patient on Monday for memory care placement.

## 2023-07-04 NOTE — ED Provider Notes (Addendum)
Emergency Medicine Observation Re-evaluation Note  Nathan Mcdaniel is a 82 y.o. male, seen on rounds today.  Pt initially presented to the ED for complaints of Altered Mental Status Currently, the patient is sleeping.  Physical Exam  BP (!) 93/59 (BP Location: Left Arm)   Pulse 80   Temp 97.6 F (36.4 C) (Axillary)   Resp 12   Ht 5\' 6"  (1.676 m)   Wt 72.6 kg   SpO2 97%   BMI 25.82 kg/m  Physical Exam General: WDWN Cardiac: HR normal at 80 Lungs: no distress, rr 12 while sleeping Psych: sleeping  ED Course / MDM  EKG:EKG Interpretation Date/Time:  Saturday June 27 2023 10:58:08 EST Ventricular Rate:  104 PR Interval:    QRS Duration:  125 QT Interval:  388 QTC Calculation: 511 R Axis:   -2  Text Interpretation: Normal sinus rhythm Ventricular bigeminy Left bundle branch block No significant change since last tracing Confirmed by Elayne Snare ([id number]) on 06/27/2023 11:02:00 AM  I have reviewed the labs performed to date as well as medications administered while in observation.  Recent changes in the last 24 hours include-Patient was reported to have been unable to sleep for the last 2-3 days. Patient currently sleeping and has been since 7/8pm last night.  Plan  Current plan is for memory care unit to come eval patient Monday.    Franne Forts, DO 07/04/23 0734    Franne Forts, DO 07/04/23 [phone]

## 2023-07-04 NOTE — ED Notes (Signed)
Pt has slept all of this shift. No behavior issues. Pt has not woke up any throughout the night so pt did not receive his night time depakote to prevent disrupted sleep pattern as pt has not been sleeping the last 2 nights.

## 2023-07-05 LAB — CBC
HCT: 33.5 % — ABNORMAL LOW (ref 39.0–52.0)
Hemoglobin: 10.7 g/dL — ABNORMAL LOW (ref 13.0–17.0)
MCH: 29.1 pg (ref 26.0–34.0)
MCHC: 31.9 g/dL (ref 30.0–36.0)
MCV: 91 fL (ref 80.0–100.0)
Platelets: 231 10*3/uL (ref 150–400)
RBC: 3.68 MIL/uL — ABNORMAL LOW (ref 4.22–5.81)
RDW: 14.4 % (ref 11.5–15.5)
WBC: 7.4 10*3/uL (ref 4.0–10.5)
nRBC: 0 % (ref 0.0–0.2)

## 2023-07-05 LAB — PROTIME-INR
INR: 3.1 — ABNORMAL HIGH (ref 0.8–1.2)
Prothrombin Time: 32.1 s — ABNORMAL HIGH (ref 11.4–15.2)

## 2023-07-05 MED ORDER — WARFARIN SODIUM 2 MG PO TABS
2.0000 mg | ORAL_TABLET | Freq: Once | ORAL | Status: AC
  Administered 2023-07-05: 2 mg via ORAL
  Filled 2023-07-05: qty 1

## 2023-07-05 MED ORDER — DIPHENHYDRAMINE HCL 50 MG/ML IJ SOLN
25.0000 mg | Freq: Once | INTRAMUSCULAR | Status: AC
  Administered 2023-07-05: 25 mg via INTRAMUSCULAR
  Filled 2023-07-05: qty 1

## 2023-07-05 MED ORDER — HALOPERIDOL LACTATE 5 MG/ML IJ SOLN
2.5000 mg | Freq: Once | INTRAMUSCULAR | Status: AC
  Administered 2023-07-05: 2.5 mg via INTRAMUSCULAR
  Filled 2023-07-05: qty 1

## 2023-07-05 NOTE — ED Notes (Signed)
Pt has had a very restless night tonight. Pt has been awake more of the shift than he has been asleep, frequently sitting up and attempting to get out of bed. Pt sitter took pt for a walk around the halls of the unit to attempt to decrease pt restlessness. Pt requested to call his wife and was given time to talk to her on the phone. Pt has not been violent with staff tonight and able to be redirected with verbal redirection with some difficulties at times tonight. Pt refused to eat dinner or any snacks. Pt also refused to take any PO medications tonight for this nurse.

## 2023-07-05 NOTE — ED Provider Notes (Signed)
Emergency Medicine Observation Re-evaluation Note  Nathan Mcdaniel is a 82 y.o. male, seen on rounds today.  Pt initially presented to the ED for complaints of Altered Mental Status Currently, the patient is restin.  Physical Exam  BP 128/70 (BP Location: Left Arm)   Pulse 99   Temp 98.4 F (36.9 C) (Oral)   Resp 18   Ht 5\' 6"  (1.676 m)   Wt 72.6 kg   SpO2 97%   BMI 25.82 kg/m  Physical Exam General: nad Lungs: no resp distress Psych: calm  ED Course / MDM  EKG:EKG Interpretation Date/Time:  Saturday June 27 2023 10:58:08 EST Ventricular Rate:  104 PR Interval:    QRS Duration:  125 QT Interval:  388 QTC Calculation: 511 R Axis:   -2  Text Interpretation: Normal sinus rhythm Ventricular bigeminy Left bundle branch block No significant change since last tracing Confirmed by Elayne Snare ([id number]) on 06/27/2023 11:02:00 AM  I have reviewed the labs performed to date as well as medications administered while in observation.  Recent changes in the last 24 hours include intermittently refusing meals and medications.  Intermittently agitated  Plan  Current plan is for placement.    Sloan Leiter, DO 07/05/23 [phone]

## 2023-07-05 NOTE — Progress Notes (Signed)
PHARMACY - ANTICOAGULATION CONSULT NOTE  Pharmacy Consult for Warfarin Indication: mechanical MVR, + afib with h/o CVAs   Allergies  Allergen Reactions   Aricept [Donepezil Hcl] Other (See Comments)    Hallucinations    Namenda [Memantine Hcl] Other (See Comments)    Hallucinations    Olanzapine Anxiety    Agitation, aggression.    Patient Measurements: Height: 5\' 6"  (167.6 cm) Weight: 72.6 kg (160 lb) IBW/kg (Calculated) : 63.8  Vital Signs: Temp: 98.4 F (36.9 C) (12/01 0630) Temp Source: Oral (12/01 0630) BP: 128/70 (12/01 0630) Pulse Rate: 99 (12/01 0630)  Labs: Recent Labs    07/03/23 0611 07/03/23 1154 07/04/23 1154 07/05/23 1308  HGB 12.6*  --   --  10.7*  HCT 38.9*  --   --  33.5*  PLT 259  --   --  231  LABPROT 38.0* 36.5* 36.5* 32.1*  INR 3.8* 3.6* 3.6* 3.1*    Estimated Creatinine Clearance: 49.6 mL/min (by C-G formula based on SCr of 1 mg/dL).  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 MD initially continued home warfarin regimen with MWF INR monitoring, but INR elevated 11/27, and pharmacy consulted to assume management hereafter.    Prior anticoagulation: warfarin 2.5 mg daily; last dose prior to admission 11/26 Admit INR 3.1 (11/23) on 2.5mg  daily (goal INR 2.5-3.5). Patient's 2.5mg  daily dose was continued inpatient and INR was ordered every 3 days. Next INR on 11/27 was elevated at 4.6 >> warfarin held >> INR down to 3.6 the following day >> warfarin resumed.  Today, 07/05/23: CBC: Hgb decreased to 10.7, Plt WNL INR = 3.1, now in therapeutic range No bleeding issues noted per RN Of note, patient was initiated on divalproex sprinkles 125mg  PO TID on 11/25, which may increase INR  Goal of Therapy:  INR 2.5-3.5 Monitor platelets by anticoagulation protocol: Yes   Plan:  Warfarin 2mg  PO x 1 today  Daily INR CBC at least q72h while hospitalized     Greer Pickerel,  PharmD, BCPS Clinical Pharmacist 07/05/2023 3:09 PM

## 2023-07-05 NOTE — ED Notes (Signed)
Patient has been restless during shift. Confused.  Nonsensical.  Can not follow simple instructions.  Limited safety awareness. Patient needs assist with all ADLs.  Patient can ambulate with 2 assist.  Patient medication compliant this shift.

## 2023-07-05 NOTE — ED Notes (Signed)
Pt becoming un-directable , throwing coffee and water on sitter and floor, pt seen by MD because of behavior, please see orders

## 2023-07-06 MED ORDER — STERILE WATER FOR INJECTION IJ SOLN
INTRAMUSCULAR | Status: AC
  Administered 2023-07-06: 0.5 mL
  Filled 2023-07-06: qty 10

## 2023-07-06 MED ORDER — ZIPRASIDONE MESYLATE 20 MG IM SOLR
10.0000 mg | Freq: Once | INTRAMUSCULAR | Status: AC
  Administered 2023-07-06: 10 mg via INTRAMUSCULAR
  Filled 2023-07-06: qty 20

## 2023-07-06 MED ORDER — WARFARIN SODIUM 2 MG PO TABS
2.0000 mg | ORAL_TABLET | Freq: Once | ORAL | Status: AC
  Administered 2023-07-06: 2 mg via ORAL
  Filled 2023-07-06: qty 1

## 2023-07-06 NOTE — Progress Notes (Signed)
PHARMACY - ANTICOAGULATION CONSULT NOTE  Pharmacy Consult for Warfarin Indication: mechanical MVR, + afib with h/o CVAs   Allergies  Allergen Reactions   Aricept [Donepezil Hcl] Other (See Comments)    Hallucinations    Namenda [Memantine Hcl] Other (See Comments)    Hallucinations    Olanzapine Anxiety    Agitation, aggression.    Patient Measurements: Height: 5\' 6"  (167.6 cm) Weight: 72.6 kg (160 lb) IBW/kg (Calculated) : 63.8  Vital Signs: Temp: 98.3 F (36.8 C) (12/02 0353) Temp Source: Oral (12/02 0353) BP: 141/70 (12/02 0353) Pulse Rate: 98 (12/02 0353)  Labs: Recent Labs    07/03/23 1154 07/04/23 1154 07/05/23 1308  HGB  --   --  10.7*  HCT  --   --  33.5*  PLT  --   --  231  LABPROT 36.5* 36.5* 32.1*  INR 3.6* 3.6* 3.1*    Estimated Creatinine Clearance: 49.6 mL/min (by C-G formula based on SCr of 1 mg/dL).  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 MD initially continued home warfarin regimen with MWF INR monitoring, but INR elevated 11/27, and pharmacy consulted to assume management hereafter.    Prior anticoagulation: warfarin 2.5 mg daily; last dose prior to admission 11/26 Admit INR 3.1 (11/23) on 2.5mg  daily (goal INR 2.5-3.5). Patient's 2.5mg  daily dose was continued inpatient and INR was ordered every 3 days. Next INR on 11/27 was elevated at 4.6 >> warfarin held >> INR down to 3.6 the following day >> warfarin resumed.  Today, 07/06/23: CBC: Hgb decreased to 10.7, Plt WNL per 12/1 labs INR 3.1 on 12/1, not drawn on 12/2 No bleeding issues noted per RN Of note, patient was initiated on divalproex sprinkles 125mg  PO TID on 11/25, which may increase INR  Goal of Therapy:  INR 2.5-3.5 Monitor platelets by anticoagulation protocol: Yes   Plan:  Repeat warfarin 2mg  PO x 1 today  Daily INR CBC at least q72h while hospitalized    Hessie Knows, PharmD, BCPS Secure  Chat if ?s 07/06/2023 9:47 AM

## 2023-07-06 NOTE — ED Provider Notes (Signed)
Emergency Medicine Observation Re-evaluation Note  Nathan Mcdaniel is a 82 y.o. male, seen on rounds today.  Pt initially presented to the ED for complaints of Altered Mental Status Currently, the patient is calm.  Physical Exam  BP (!) 141/70 (BP Location: Left Arm)   Pulse 98   Temp 98.3 F (36.8 C) (Oral)   Resp 16   Ht 1.676 m (5\' 6" )   Wt 72.6 kg   SpO2 99%   BMI 25.82 kg/m  Physical Exam General: nad Cardiac: regular rate Lungs: normal effort  ED Course / MDM  EKG:EKG Interpretation Date/Time:  Saturday June 27 2023 10:58:08 EST Ventricular Rate:  104 PR Interval:    QRS Duration:  125 QT Interval:  388 QTC Calculation: 511 R Axis:   -2  Text Interpretation: Normal sinus rhythm Ventricular bigeminy Left bundle branch block No significant change since last tracing Confirmed by Elayne Snare ([id number]) on 06/27/2023 11:02:00 AM  I have reviewed the labs performed to date as well as medications administered while in observation.  Recent changes in the last 24 hours include episodes of agitation requiring occasions.  Plan  Current plan is for placement.  [facility] will be assessing patient today for for appropriateness to their facility Labs performed yesterday showed a decrease in hemoglobin compared to previous.  INR in therapeutic range.  Will guaiac stools while patient is in the ED    Linwood Dibbles, MD 07/06/23 [phone]

## 2023-07-06 NOTE — ED Notes (Signed)
RN attempting to medicate pt, noting he is becoming increasing restless and agitated, pt refused to open mouth for medication and when he did it spit it out. Pt would not swallow medication and is becoming increasing non-direct-able, RN will inform MD

## 2023-07-06 NOTE — Progress Notes (Signed)
  PT Cancellation Note  Patient Details Name: Nathan Mcdaniel MRN: [id number] DOB: [birth year]   Cancelled Treatment:    Reason Eval/Treat Not Completed: Patient's level of consciousness, patient  restless in bed, waist roll belt in place.  Staff reports patient has been ambulating earlier today, more recently more aggressive. Patient currently not able to follow any  directions  and sitter reports patient may be more aggressive if attempt mobility. Appears patient  waiting for  Memory care facility.  Blanchard Kelch PT Acute Rehabilitation Services Office [phone] Weekend pager-[phone]   [name] 07/06/2023, 4:48 PM

## 2023-07-06 NOTE — Progress Notes (Signed)
CSW assisted pt with virtual assessment with [facility]. Awaiting determination.

## 2023-07-06 NOTE — ED Notes (Signed)
Pt has woke up, pt attempting to get out of bed, pt was taken to bathroom by Sitter per pt request, pt began to splash water onto sitter, pt redirected with no improvement. Pt poured drink onto floor, pt refusing to get back in bed with redirection. It took several people to get pt into bed, pt noted to be attempting to hit staff, RN will make MD aware.

## 2023-07-06 NOTE — ED Provider Notes (Signed)
Was called by nursing as patient was increasingly agitated.  He was trying to get out of the bed.  He is on blood thinners.  He was also throwing things at nursing.  Per report he is not taking anything by mouth and is spitting it out.  This includes his includes Risperdal.  Received a dose of Haldol and Benadryl approximately 5 to 6 hours ago for similar behavior.  Will dose Geodon as patient is clearly unsafe and not redirectable. Physical Exam  BP 106/65 (BP Location: Left Arm)   Pulse 90   Temp 98.1 F (36.7 C) (Oral)   Resp 16   Ht 1.676 m (5\' 6" )   Wt 72.6 kg   SpO2 98%   BMI 25.82 kg/m   Physical Exam Acutely agitated, attempting to get out of the bed, not directable. Procedures  Procedures  ED Course / MDM    Medical Decision Making Amount and/or Complexity of Data Reviewed Labs: ordered. Radiology: ordered.  Risk OTC drugs. Prescription drug management.          Shon Baton, MD 07/06/23 0300

## 2023-07-06 NOTE — ED Notes (Signed)
Gave [facility] phone number for SW because she was asking for Pt Rx list and other info that SW normally gives when pt looking for placement

## 2023-07-07 ENCOUNTER — Emergency Department (HOSPITAL_COMMUNITY): Payer: No Typology Code available for payment source

## 2023-07-07 DIAGNOSIS — M7981 Nontraumatic hematoma of soft tissue: Secondary | ICD-10-CM | POA: Diagnosis present

## 2023-07-07 DIAGNOSIS — F03918 Unspecified dementia, unspecified severity, with other behavioral disturbance: Secondary | ICD-10-CM | POA: Diagnosis not present

## 2023-07-07 LAB — CBC WITH DIFFERENTIAL/PLATELET
Abs Immature Granulocytes: 0.02 10*3/uL (ref 0.00–0.07)
Basophils Absolute: 0 10*3/uL (ref 0.0–0.1)
Basophils Relative: 1 %
Eosinophils Absolute: 0.1 10*3/uL (ref 0.0–0.5)
Eosinophils Relative: 1 %
HCT: 32.3 % — ABNORMAL LOW (ref 39.0–52.0)
Hemoglobin: 10 g/dL — ABNORMAL LOW (ref 13.0–17.0)
Immature Granulocytes: 0 %
Lymphocytes Relative: 14 %
Lymphs Abs: 0.9 10*3/uL (ref 0.7–4.0)
MCH: 28.5 pg (ref 26.0–34.0)
MCHC: 31 g/dL (ref 30.0–36.0)
MCV: 92 fL (ref 80.0–100.0)
Monocytes Absolute: 0.5 10*3/uL (ref 0.1–1.0)
Monocytes Relative: 7 %
Neutro Abs: 5 10*3/uL (ref 1.7–7.7)
Neutrophils Relative %: 77 %
Platelets: 279 10*3/uL (ref 150–400)
RBC: 3.51 MIL/uL — ABNORMAL LOW (ref 4.22–5.81)
RDW: 14.3 % (ref 11.5–15.5)
WBC: 6.5 10*3/uL (ref 4.0–10.5)
nRBC: 0 % (ref 0.0–0.2)

## 2023-07-07 LAB — COMPREHENSIVE METABOLIC PANEL
ALT: 17 U/L (ref 0–44)
AST: 27 U/L (ref 15–41)
Albumin: 3.7 g/dL (ref 3.5–5.0)
Alkaline Phosphatase: 54 U/L (ref 38–126)
Anion gap: 7 (ref 5–15)
BUN: 41 mg/dL — ABNORMAL HIGH (ref 8–23)
CO2: 31 mmol/L (ref 22–32)
Calcium: 8.8 mg/dL — ABNORMAL LOW (ref 8.9–10.3)
Chloride: 101 mmol/L (ref 98–111)
Creatinine, Ser: 0.99 mg/dL (ref 0.61–1.24)
GFR, Estimated: >60 mL/min (ref >60)
Glucose, Bld: 143 mg/dL — ABNORMAL HIGH (ref 70–99)
Potassium: 4 mmol/L (ref 3.5–5.1)
Sodium: 139 mmol/L (ref 135–145)
Total Bilirubin: 1.6 mg/dL — ABNORMAL HIGH (ref <1.2)
Total Protein: 6.3 g/dL — ABNORMAL LOW (ref 6.5–8.1)

## 2023-07-07 LAB — PROTIME-INR
INR: 2.6 — ABNORMAL HIGH (ref 0.8–1.2)
Prothrombin Time: 27.9 s — ABNORMAL HIGH (ref 11.4–15.2)

## 2023-07-07 LAB — CBG MONITORING, ED: Glucose-Capillary: 107 mg/dL — ABNORMAL HIGH (ref 70–99)

## 2023-07-07 LAB — HEMOGLOBIN AND HEMATOCRIT, BLOOD
HCT: 30.8 % — ABNORMAL LOW (ref 39.0–52.0)
Hemoglobin: 9.8 g/dL — ABNORMAL LOW (ref 13.0–17.0)

## 2023-07-07 MED ORDER — ENSURE ENLIVE PO LIQD
237.0000 mL | Freq: Two times a day (BID) | ORAL | Status: DC
  Administered 2023-07-07: 237 mL via ORAL
  Filled 2023-07-07: qty 237

## 2023-07-07 MED ORDER — INSULIN ASPART 100 UNIT/ML IJ SOLN
0.0000 [IU] | Freq: Three times a day (TID) | INTRAMUSCULAR | Status: DC
  Administered 2023-07-08: 2 [IU] via SUBCUTANEOUS
  Filled 2023-07-07: qty 0.15

## 2023-07-07 MED ORDER — OLANZAPINE 5 MG PO TBDP: 5.0000 mg | ORAL_TABLET | Freq: Every day | ORAL | Status: DC

## 2023-07-07 MED ORDER — IOHEXOL 300 MG/ML  SOLN
100.0000 mL | Freq: Once | INTRAMUSCULAR | Status: AC | PRN
  Administered 2023-07-07: 100 mL via INTRAVENOUS

## 2023-07-07 MED ORDER — WARFARIN SODIUM 2.5 MG PO TABS
2.5000 mg | ORAL_TABLET | Freq: Once | ORAL | Status: AC
  Administered 2023-07-07: 2.5 mg via ORAL
  Filled 2023-07-07: qty 1

## 2023-07-07 MED ORDER — EMPAGLIFLOZIN 10 MG PO TABS
10.0000 mg | ORAL_TABLET | Freq: Every day | ORAL | Status: DC
  Administered 2023-07-08: 10 mg via ORAL
  Filled 2023-07-07 (×2): qty 1

## 2023-07-07 MED ORDER — SENNOSIDES-DOCUSATE SODIUM 8.6-50 MG PO TABS: 1.0000 | ORAL_TABLET | Freq: Every evening | ORAL | Status: DC | PRN

## 2023-07-07 MED ORDER — ALBUTEROL SULFATE (2.5 MG/3ML) 0.083% IN NEBU: 2.5000 mg | INHALATION_SOLUTION | RESPIRATORY_TRACT | Status: DC | PRN

## 2023-07-07 MED ORDER — INSULIN ASPART 100 UNIT/ML IJ SOLN
0.0000 [IU] | Freq: Every day | INTRAMUSCULAR | Status: DC
  Filled 2023-07-07: qty 0.05

## 2023-07-07 MED ORDER — TAMSULOSIN HCL 0.4 MG PO CAPS
0.4000 mg | ORAL_CAPSULE | Freq: Every day | ORAL | Status: DC
  Administered 2023-07-07 – 2023-07-08 (×2): 0.4 mg via ORAL
  Filled 2023-07-07 (×2): qty 1

## 2023-07-07 NOTE — Progress Notes (Signed)
TOC Dementia Note   Patient Details  Name: ARKEEM GIMA Date of Birth: 1941-01-16 07/07/2023, 9:30 AM   To Whom It May Concern:  Please be advised that the above-named patient has a primary diagnosis of dementia which supersedes any psychiatric diagnosis.   Transition of Care (TOC) CM/SW Contact: Princella Ion, LCSW Phone Number: 07/07/2023, 9:30 AM

## 2023-07-07 NOTE — Progress Notes (Signed)
PHARMACY - ANTICOAGULATION CONSULT NOTE  Pharmacy Consult for Warfarin Indication: mechanical MVR, + afib with h/o CVAs   Allergies  Allergen Reactions   Aricept [Donepezil Hcl] Other (See Comments)    Hallucinations    Namenda [Memantine Hcl] Other (See Comments)    Hallucinations    Olanzapine Anxiety    Agitation, aggression.    Patient Measurements: Height: 5\' 6"  (167.6 cm) Weight: 72.6 kg (160 lb) IBW/kg (Calculated) : 63.8  Vital Signs: Temp: 98.3 F (36.8 C) (12/03 0659) Temp Source: Oral (12/03 0659) BP: 135/79 (12/03 0659) Pulse Rate: 100 (12/03 0659)  Labs: Recent Labs    07/04/23 1154 07/05/23 1308 07/07/23 0655  HGB  --  10.7*  --   HCT  --  33.5*  --   PLT  --  231  --   LABPROT 36.5* 32.1* 27.9*  INR 3.6* 3.1* 2.6*    Estimated Creatinine Clearance: 49.6 mL/min (by C-G formula based on SCr of 1 mg/dL).  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 MD initially continued home warfarin regimen with MWF INR monitoring, but INR elevated 11/27, and pharmacy consulted to assume management hereafter.    Prior anticoagulation: warfarin 2.5 mg daily; last dose prior to admission 11/26 Admit INR 3.1 (11/23) on 2.5mg  daily (goal INR 2.5-3.5). Patient's 2.5mg  daily dose was continued inpatient and INR was ordered every 3 days. Next INR on 11/27 was elevated at 4.6 >> warfarin held >> INR down to 3.6 the following day >> warfarin resumed.  Today, 07/07/23: CBC: Hgb decreased to 10.7, Plt WNL per 12/1 labs INR 2.6 on 12/3 No bleeding issues noted per chart Of note, patient was initiated on divalproex sprinkles 125mg  PO TID on 11/25, which may increase INR  Goal of Therapy:  INR 2.5-3.5 Monitor platelets by anticoagulation protocol: Yes   Plan:  Warfarin 2.5 mg PO x 1 today  Daily INR CBC at least q72h while hospitalized     Adalberto Cole, PharmD, BCPS 07/07/2023 9:49  AM

## 2023-07-07 NOTE — Progress Notes (Signed)
CSW had an extensive conversation with pt's daughter who lives in [place], [place]. Nathan Mcdaniel expressed frustrations regarding pt's current status. Nathan Mcdaniel states she and family are interested in LTC. CSW did inform that Nathan Mcdaniel, rep with [facility] stated LTC is more expensive than memory care. CSW also informed Nathan Mcdaniel that there are notes that reflect the pt eating some of his food and notes have reported ambulation. Nathan Mcdaniel requested a physician call her sister, Nathan Mcdaniel to discuss her father. CSW staffed case with EDP Nathan Mcdaniel who will give family a call.   Pt has been denied by [facility] and [facility] at this time.

## 2023-07-07 NOTE — ED Notes (Signed)
ED TO INPATIENT HANDOFF REPORT  ED Nurse Name and Phone #:  Mellody Dance  -  [phone]  S Name/Age/Gender Nathan Mcdaniel 82 y.o. male Room/Bed: WA27/WA27  Code Status   Code Status: Do not attempt resuscitation (DNR) PRE-ARREST INTERVENTIONS DESIRED  Home/SNF/Other Skilled nursing facility Patient oriented to: self and time Is this baseline? Yes   Triage Complete: Triage complete  Chief Complaint Nontraumatic rectus hematoma [M79.81]  Triage Note PT BIB EMS c/o increased confusion. Has hx of Dementia. Per EMS. Pt had recent medication changes and this is when dementia started worsening. Ax0 x2, NAD,   BP 177/98, HR 71, 93% RA, CBG 130   Allergies Allergies  Allergen Reactions   Aricept [Donepezil Hcl] Other (See Comments)    Hallucinations    Namenda [Memantine Hcl] Other (See Comments)    Hallucinations    Olanzapine Anxiety    Agitation, aggression.    Level of Care/Admitting Diagnosis ED Disposition     ED Disposition  Admit   Condition  --   Comment  Hospital Area: [facility] [[id number]]  Level of Care: Progressive [102]  Admit to Progressive based on following criteria: MULTISYSTEM THREATS such as stable sepsis, metabolic/electrolyte imbalance with or without encephalopathy that is responding to early treatment.  May place patient in observation at [facility] or [facility] if equivalent level of care is available:: Yes  Covid Evaluation: Asymptomatic - no recent exposure (last 10 days) testing not required  Diagnosis: Nontraumatic rectus hematoma [731456]  Admitting Physician: Maryln Gottron [[id number]]  Attending Physician: Kirby Crigler, Parks Neptune [[id number]]          B Medical/Surgery History Past Medical History:  Diagnosis Date   Abnormal ECG    Allergic rhinitis 10/24/2009   Asymptomatic varicose veins 10/07/2010   Atrial fibrillation 10/14/2011   Dr Thomasene Lot follows and checks inr   Benign prostatic hyperplasia with urinary obstruction  10/07/2010   Dementia (HCC)    Elevated prostate specific antigen (PSA) 11/26/2010   Generalized anxiety disorder    Headache    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   History of skin cancer    Hypertension    Major depressive disorder    Memory disorder    Mild neurocognitive disorder, 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   Mitral insufficiency    Mobitz type 1 second degree atrioventricular block    Palpitations    Pseudoaneurysm    of forehead   Pure hypercholesterolemia 10/07/2010   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   Stroke 06/09/2018   Brain MRI revealed two remote tiny infarcts in the left cerebellum   Vitamin D deficiency 10/07/2010   Past Surgical History:  Procedure Laterality Date   CATARACT EXTRACTION, BILATERAL     CHOLECYSTECTOMY     COLON SURGERY     with diversion and resection   FALSE ANEURYSM REPAIR N/A 09/23/2019   Procedure: REPAIR FALSE ANEURYSM FOREHEAD;  Surgeon: Sherren Kerns, MD;  Location: [facility] OR;  Service: Vascular;  Laterality: N/A;   I & D EXTREMITY Left 01/28/2023   Procedure: LEFT LEG DEBRIDEMENT;  Surgeon: Nadara Mustard, MD;  Location: [facility] OR;  Service: Orthopedics;  Laterality: Left;   MITRAL VALVE REPLACEMENT  2002   with a st.jude mechanical valve   PROSTATE BIOPSY     TRIGGER FINGER RELEASE Left 06/04/2021   Procedure: RELEASE TRIGGER FINGER/A-1 PULLEY LEFT RING FINGER;  Surgeon: Cindee Salt, MD;  Location: [facility];  Service: Orthopedics;  Laterality: Left;  A IV Location/Drains/Wounds Patient Lines/Drains/Airways Status     Active Line/Drains/Airways     Name Placement date Placement time Site Days   Peripheral IV 07/07/23 20 G Anterior;Distal;Left;Upper Arm 07/07/23  1252  Arm  less than 1            Intake/Output Last 24 hours No intake or output data in the 24 hours ending 07/07/23 1725  Labs/Imaging Results for orders placed or performed during the hospital encounter of 06/27/23 (from the  past 48 hour(s))  Protime-INR     Status: Abnormal   Collection Time: 07/07/23  6:55 AM  Result Value Ref Range   Prothrombin Time 27.9 (H) 11.4 - 15.2 seconds   INR 2.6 (H) 0.8 - 1.2    Comment: (NOTE) INR goal varies based on device and disease states. Performed at [facility], [address] [place], [place] [address]   CBC with Differential     Status: Abnormal   Collection Time: 07/07/23 10:00 AM  Result Value Ref Range   WBC 6.5 4.0 - 10.5 K/uL   RBC 3.51 (L) 4.22 - 5.81 MIL/uL   Hemoglobin 10.0 (L) 13.0 - 17.0 g/dL   HCT 69.6 (L) 29.5 - 28.4 %   MCV 92.0 80.0 - 100.0 fL   MCH 28.5 26.0 - 34.0 pg   MCHC 31.0 30.0 - 36.0 g/dL   RDW 13.2 44.0 - 10.2 %   Platelets 279 150 - 400 K/uL   nRBC 0.0 0.0 - 0.2 %   Neutrophils Relative % 77 %   Neutro Abs 5.0 1.7 - 7.7 K/uL   Lymphocytes Relative 14 %   Lymphs Abs 0.9 0.7 - 4.0 K/uL   Monocytes Relative 7 %   Monocytes Absolute 0.5 0.1 - 1.0 K/uL   Eosinophils Relative 1 %   Eosinophils Absolute 0.1 0.0 - 0.5 K/uL   Basophils Relative 1 %   Basophils Absolute 0.0 0.0 - 0.1 K/uL   Immature Granulocytes 0 %   Abs Immature Granulocytes 0.02 0.00 - 0.07 K/uL    Comment: Performed at [facility], [address] [place], [place] [address]  Comprehensive metabolic panel     Status: Abnormal   Collection Time: 07/07/23 12:42 PM  Result Value Ref Range   Sodium 139 135 - 145 mmol/L   Potassium 4.0 3.5 - 5.1 mmol/L   Chloride 101 98 - 111 mmol/L   CO2 31 22 - 32 mmol/L   Glucose, Bld 143 (H) 70 - 99 mg/dL    Comment: Glucose reference range applies only to samples taken after fasting for at least 8 hours.   BUN 41 (H) 8 - 23 mg/dL   Creatinine, Ser 6.44 0.61 - 1.24 mg/dL   Calcium 8.8 (L) 8.9 - 10.3 mg/dL   Total Protein 6.3 (L) 6.5 - 8.1 g/dL   Albumin 3.7 3.5 - 5.0 g/dL   AST 27 15 - 41 U/L   ALT 17 0 - 44 U/L   Alkaline Phosphatase 54 38 - 126 U/L   Total Bilirubin 1.6 (H) <1.2 mg/dL    GFR, Estimated >03 >47 mL/min    Comment: (NOTE) Calculated using the CKD-EPI Creatinine Equation (2021)    Anion gap 7 5 - 15    Comment: Performed at [facility], [address], [place], [place] [address]   CT ABDOMEN PELVIS W CONTRAST  Result Date: 07/07/2023 CLINICAL DATA:  Retroperitoneal hematoma, known or suspected. Altered mental status. Head trauma.  On anticoagulation. No reported history of malignancy. EXAM: CT ABDOMEN AND PELVIS WITH CONTRAST TECHNIQUE: Multidetector CT imaging of the abdomen and pelvis was performed using the standard protocol following bolus administration of intravenous contrast. RADIATION DOSE REDUCTION: This exam was performed according to the departmental dose-optimization program which includes automated exposure control, adjustment of the mA and/or kV according to patient size and/or use of iterative reconstruction technique. CONTRAST:  OMNIPAQUE IOHEXOL 300 MG/ML  SOLN COMPARISON:  None Available. FINDINGS: Lower chest: Clear lung bases. No significant pleural or pericardial effusion. The heart is enlarged status post median sternotomy and mitral valve replacement. Hepatobiliary: The liver is normal in density without highly suspicious focal abnormality. There are multiple hepatic cysts, measuring up to 2.3 cm in the left lobe on image 12/5. Inferiorly in the right lobe, there is a peripherally enhancing 1.1 cm low-density lesion on image 27/5 which becomes less evident on the delayed images, likely reflecting a hemangioma. Status post cholecystectomy. No evidence of biliary dilatation. Pancreas: Mild atrophy. No focal abnormality, ductal dilatation or surrounding inflammation. Spleen: Normal in size without focal abnormality. Adrenals/Urinary Tract: Both adrenal glands appear normal. No evidence of urinary tract calculus, suspicious renal lesion or hydronephrosis. Multiple bilateral renal cysts, measuring up to 7.7 cm in the upper pole the  right kidney for which no specific follow-up imaging recommended. The urinary bladder appears unremarkable for its degree of distention. Stomach/Bowel: No enteric contrast administered. The stomach appears unremarkable for its degree of distension. No evidence of bowel wall thickening, distention or surrounding inflammatory change. The appendix appears normal. Vascular/Lymphatic: There are no enlarged abdominal or pelvic lymph nodes. Aortic and branch vessel atherosclerosis without evidence of aneurysm or large vessel occlusion. Reproductive: The prostate gland is markedly enlarged and heterogeneous, measuring up to 8.0 x 6.4 cm transverse. Other: Heterogeneous enlargement of the right rectus abdominus muscle, measuring up to 8.2 x 6.5 cm transverse on image 64/5 and up to 13.9 cm in length on sagittal image 71/12 with intermediate density, likely reflecting a hematoma. Mild contralateral enlargement of the left rectus sheath. Surrounding soft tissue stranding in the subcutaneous and intraperitoneal fat. There is mild nonspecific perirectal soft tissue stranding, but no significant retroperitoneal hematoma. No evidence of hemoperitoneum or pneumoperitoneum. Possible herniorrhaphy changes in the left inguinal region. Musculoskeletal: No acute or significant osseous findings. Chronic appearing Schmorl's node involving the superior endplate of T12. Multilevel spondylosis. IMPRESSION: 1. Heterogeneous enlargement of the right rectus abdominus muscle, likely reflecting a hematoma. Mild contralateral enlargement of the left rectus sheath. Surrounding soft tissue stranding in the subcutaneous and intraperitoneal fat. 2. No evidence of significant retroperitoneal hematoma or hemoperitoneum. 3. No other acute findings. 4. Markedly enlarged and heterogeneous prostate gland. 5. Small peripherally enhancing low-density lesion in the right lobe of the liver, likely reflecting a hemangioma. This could be more definitively  characterized with MRI without and with contrast, if clinically warranted. Additional simple appearing hepatic and renal cysts bilaterally. 6.  Aortic Atherosclerosis (ICD10-I70.0). Electronically Signed   By: Carey Bullocks M.D.   On: 07/07/2023 15:19   CT Head Wo Contrast  Result Date: 07/07/2023 CLINICAL DATA:  Head trauma, altered mental status (Ped 0-17y) EXAM: CT HEAD WITHOUT CONTRAST TECHNIQUE: Contiguous axial images were obtained from the base of the skull through the vertex without intravenous contrast. RADIATION DOSE REDUCTION: This exam was performed according to the departmental dose-optimization program which includes automated exposure control, adjustment of the mA and/or kV according to patient size and/or use of  iterative reconstruction technique. COMPARISON:  None Available. FINDINGS: Brain: Diffuse cerebral atrophy. No acute intracranial abnormality. Specifically, no hemorrhage, hydrocephalus, mass lesion, acute infarction, or significant intracranial injury. Vascular: No hyperdense vessel or unexpected calcification. Skull: No acute calvarial abnormality. Sinuses/Orbits: No acute findings Other: None IMPRESSION: No acute intracranial abnormality. Electronically Signed   By: Charlett Nose M.D.   On: 07/07/2023 14:48    Pending Labs Unresulted Labs (From admission, onward)     Start     Ordered   07/08/23 0500  Basic metabolic panel  Tomorrow morning,   R        07/07/23 1719   07/08/23 0500  CBC  Tomorrow morning,   R        07/07/23 1719   07/07/23 1719  Hemoglobin and hematocrit, blood  Now then every 8 hours,   R (with TIMED occurrences)      07/07/23 1719   07/07/23 1718  Hemoglobin A1c  (Glycemic Control (SSI)  Q 4 Hours / Glycemic Control (SSI)  AC +/- HS)  Once,   R       Comments: To assess prior glycemic control    07/07/23 1719   07/06/23 0813  Occult blood card to lab, stool  Once,   URGENT        07/06/23 0812   07/02/23 0500  Protime-INR  Daily,   R       07/01/23 1613            Vitals/Pain Today's Vitals   07/06/23 0353 07/06/23 1334 07/06/23 1843 07/07/23 0659  BP: (!) 141/70 (!) 120/94 126/68 135/79  Pulse: 98 70 (!) 59 100  Resp: 16 18 16 16   Temp: 98.3 F (36.8 C) 97.6 F (36.4 C) 98.3 F (36.8 C) 98.3 F (36.8 C)  TempSrc: Oral Oral Oral Oral  SpO2: 99% 100% 91% 96%  Weight:      Height:      PainSc:        Isolation Precautions No active isolations  Medications Medications  acetaminophen (TYLENOL) tablet 650 mg (650 mg Oral Given 07/05/23 2054)  ondansetron (ZOFRAN) tablet 4 mg (has no administration in time range)  alum & mag hydroxide-simeth (MAALOX/MYLANTA) 200-200-20 MG/5ML suspension 30 mL (has no administration in time range)  escitalopram (LEXAPRO) tablet 20 mg (20 mg Oral Given 07/07/23 0936)  gabapentin (NEURONTIN) capsule 300 mg (300 mg Oral Given 07/07/23 0937)  LORazepam (ATIVAN) tablet 0.5 mg (0.5 mg Oral Given 07/06/23 2113)  risperiDONE (RISPERDAL M-TABS) disintegrating tablet 1 mg (1 mg Oral Given 07/06/23 2113)    And  LORazepam (ATIVAN) tablet 1 mg (1 mg Oral Given 06/27/23 2103)    And  ziprasidone (GEODON) injection 20 mg (20 mg Intramuscular Given 06/27/23 2212)  divalproex (DEPAKOTE SPRINKLE) capsule 125 mg (125 mg Oral Given 07/07/23 1027)  Warfarin - Pharmacist Dosing Inpatient ( Does not apply Given 07/06/23 1606)  warfarin (COUMADIN) tablet 2.5 mg (has no administration in time range)  feeding supplement (ENSURE ENLIVE / ENSURE PLUS) liquid 237 mL (has no administration in time range)  OLANZapine zydis (ZYPREXA) disintegrating tablet 5 mg (has no administration in time range)  empagliflozin (JARDIANCE) tablet 10 mg (has no administration in time range)  tamsulosin (FLOMAX) capsule 0.4 mg (has no administration in time range)  insulin aspart (novoLOG) injection 0-5 Units (has no administration in time range)  insulin aspart (novoLOG) injection 0-15 Units (has no administration in time range)   senna-docusate (Senokot-S) tablet 1 tablet (has  no administration in time range)  albuterol (PROVENTIL) (2.5 MG/3ML) 0.083% nebulizer solution 2.5 mg (has no administration in time range)  LORazepam (ATIVAN) injection 1 mg (1 mg Intravenous Given 06/27/23 1102)  LORazepam (ATIVAN) tablet 1 mg (1 mg Oral Given 06/28/23 0259)  sodium chloride 0.9 % bolus 1,000 mL (0 mLs Intravenous Stopped 06/28/23 2017)  warfarin (COUMADIN) tablet 2.5 mg (2.5 mg Oral Given 07/02/23 1627)  haloperidol lactate (HALDOL) injection 2.5 mg (2.5 mg Intramuscular Given 07/02/23 2340)  warfarin (COUMADIN) tablet 1.5 mg (1.5 mg Oral Given by Other 07/04/23 1617)  warfarin (COUMADIN) tablet 2 mg (2 mg Oral Given 07/05/23 1627)  haloperidol lactate (HALDOL) injection 2.5 mg (2.5 mg Intramuscular Given 07/05/23 2129)  diphenhydrAMINE (BENADRYL) injection 25 mg (25 mg Intramuscular Given 07/05/23 2129)  ziprasidone (GEODON) injection 10 mg (10 mg Intramuscular Given 07/06/23 0314)  sterile water (preservative free) injection (0.5 mLs  Given 07/06/23 0314)  warfarin (COUMADIN) tablet 2 mg (2 mg Oral Given 07/06/23 1606)  iohexol (OMNIPAQUE) 300 MG/ML solution 100 mL (100 mLs Intravenous Contrast Given 07/07/23 1420)    Mobility walks with person assist     Focused Assessments    R Recommendations: See Admitting Provider Note  Report given to:   Additional Notes:

## 2023-07-07 NOTE — ED Provider Notes (Addendum)
Emergency Medicine Observation Re-evaluation Note  Nathan Mcdaniel is a 82 y.o. male, seen on rounds today.  Pt initially presented to the ED for complaints of Altered Mental Status Currently, the patient is awaiting memory care.  Physical Exam  BP 135/79 (BP Location: Left Arm)   Pulse 100   Temp 98.3 F (36.8 C) (Oral)   Resp 16   Ht 5\' 6"  (1.676 m)   Wt 72.6 kg   SpO2 96%   BMI 25.82 kg/m  Physical Exam General: NAD Cardiac: RR Lungs: even unlabored Abdomen: contusions bilateral flank, lower abdomen to pelvis, right lower abdomen with fullness  ED Course / MDM  EKG:EKG Interpretation Date/Time:  Saturday June 27 2023 10:58:08 EST Ventricular Rate:  104 PR Interval:    QRS Duration:  125 QT Interval:  388 QTC Calculation: 511 R Axis:   -2  Text Interpretation: Normal sinus rhythm Ventricular bigeminy Left bundle branch block No significant change since last tracing Confirmed by Elayne Snare ([id number]) on 06/27/2023 11:02:00 AM  I have reviewed the labs performed to date as well as medications administered while in observation.  Recent changes in the last 24 hours include none.  Plan  Current plan is for awaiting memory care placement.    Will recheck hgb today given labs 2 days ago showed decreasing hgb.  Hemoccult order was placed but has not been completed yet--discussed with staff to obtain today.  Discussed with daughter his rapid mental decline.  He did have some word finding difficulties happening over a couple of weeks and then had a night with significant agitation, hallucinations and seem to have more garbled speech after that.  He had an MRI done in the end of October (at which time I saw him without any significant speech problems//was done for [facility]) which showed no acute abnormalities, had a CT head completed November 23.  Do feel history is more consistent with a progression of dementia and delirium and less likely acute CVA, however will repeat head  CT today for further evaluation, evolution, change in setting of garbled speech.  Do feel that the sedation that may be needed to complete an MRI and given more gradual change do not feel risk of MRI outweighs benefits.  CT completed shows no evidence of ICH or other acute changes.  On my exam today, he has significant hematomas over his lower abdomen, extending up his flanks.  Will order CT abdomen pelvis for further evaluation, evaluate for signs of retroperitoneal hemorrhage or other.    I did discuss with his daughter he is DNR. She will check his MOST form for other details and wife will try to bring to ED.   Signed out with CT abdomen pelvis pending.     Alvira Monday, MD 07/07/23 2214

## 2023-07-07 NOTE — Progress Notes (Addendum)
[place] [facility] [place] at [facility] to complete assessment tomorrow.   Addend @ 1:07 PM CSW uploaded requested documentation into [facility]. TOC following.

## 2023-07-07 NOTE — NC FL2 (Signed)
[place] MEDICAID FL2 LEVEL OF CARE FORM     IDENTIFICATION  Patient Name: Nathan Mcdaniel Birthdate: [birth year] Sex: male Admission Date (Current Location): 06/27/2023  [facility] and [place] Number:  Producer, television/film/video and Address:  [facility],  [address], [place] [address]      Provider Number: [id number]  Attending Physician Name and Address:  Alvira Monday, MD  Relative Name and Phone Number:       Current Level of Care: Hospital Recommended Level of Care: Memory Care Prior Approval Number:    Date Approved/Denied:   PASRR Number: PENDING  Discharge Plan: Other (Comment) (memory care)    Current Diagnoses: Patient Active Problem List   Diagnosis Date Noted   Dementia with behavioral disturbance (HCC) 06/27/2023   Hematoma of left lower leg 01/28/2023   Gross hematuria 02/19/2020   Mild neurocognitive disorder, 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   Major depressive disorder    Generalized anxiety disorder    Long term (current) use of anticoagulants 12/17/2017   Mobitz type 1 second degree atrioventricular block    Abnormal ECG    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   Atrial fibrillation 10/14/2011   Hypertension    Palpitations    Mitral insufficiency    Elevated prostate specific antigen (PSA) 11/26/2010   Pure hypercholesterolemia 10/07/2010   Asymptomatic varicose veins 10/07/2010   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   Vitamin D deficiency 10/07/2010   Allergic rhinitis 10/24/2009   Bunion 05/21/2009   Corn or callus 12/26/2008   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    Orientation RESPIRATION BLADDER Height & Weight     Self  Normal Incontinent Weight: 160 lb (72.6 kg) Height:  5\' 6"  (167.6 cm)  BEHAVIORAL SYMPTOMS/MOOD NEUROLOGICAL BOWEL NUTRITION STATUS      Incontinent Diet  AMBULATORY STATUS COMMUNICATION OF NEEDS Skin   Limited Assist Verbally Normal                        Personal Care Assistance Level of Assistance  Feeding, Dressing, Bathing Bathing Assistance: Maximum assistance Feeding assistance: Limited assistance Dressing Assistance: Limited assistance     Functional Limitations Info  Sight, Hearing, Speech Sight Info: Adequate Hearing Info: Adequate Speech Info: Adequate    SPECIAL CARE FACTORS FREQUENCY                       Contractures Contractures Info: Not present    Additional Factors Info  Code Status, Allergies Code Status Info: Full Allergies Info: aricept, namenda, olanzapine           Current Medications (07/07/2023):  This is the current hospital active medication list Current Facility-Administered Medications  Medication Dose Route Frequency Provider Last Rate Last Admin   acetaminophen (TYLENOL) tablet 650 mg  650 mg Oral Q4H PRN Fayrene Helper, PA-C   650 mg at 07/05/23 2054   alum & mag hydroxide-simeth (MAALOX/MYLANTA) 200-200-20 MG/5ML suspension 30 mL  30 mL Oral Q6H PRN Fayrene Helper, PA-C       divalproex (DEPAKOTE SPRINKLE) capsule 125 mg  125 mg Oral TID Ophelia Shoulder E, NP   125 mg at 07/07/23 0937   escitalopram (LEXAPRO) tablet 20 mg  20 mg Oral q AM Fayrene Helper, PA-C   20 mg at 07/07/23 0936   gabapentin (NEURONTIN) capsule 300 mg  300 mg Oral Daily Fayrene Helper, PA-C   300 mg at 07/07/23 [phone]  LORazepam (ATIVAN) tablet 0.5 mg  0.5 mg Oral Q8H PRN Dahlia Byes C, NP   0.5 mg at 07/06/23 2113   ondansetron (ZOFRAN) tablet 4 mg  4 mg Oral Q8H PRN Fayrene Helper, PA-C       risperiDONE (RISPERDAL M-TABS) disintegrating tablet 1 mg  1 mg Oral Q8H PRN Dahlia Byes C, NP   1 mg at 07/06/23 2113   warfarin (COUMADIN) tablet 2.5 mg  2.5 mg Oral ONCE-1600 Alvira Monday, MD       Warfarin - Pharmacist Dosing Inpatient   Does not apply q1600 Norva Pavlov [facility]   Given at 07/06/23 1606   Current Outpatient Medications  Medication Sig Dispense Refill   acetaminophen (TYLENOL) 325 MG tablet Take  650 mg by mouth every 6 (six) hours as needed for moderate pain or headache.     LORazepam (ATIVAN) 0.5 MG tablet Take 0.5 mg by mouth as needed for anxiety or sedation.     Multiple Vitamin (MULTIVITAMIN WITH MINERALS) TABS tablet Take 1 tablet by mouth in the morning.     OLANZapine zydis (ZYPREXA) 5 MG disintegrating tablet Take 5 mg by mouth daily.     tamsulosin (FLOMAX) 0.4 MG CAPS capsule Take 0.4 mg by mouth at bedtime.     warfarin (COUMADIN) 2.5 MG tablet Take 2.5 mg by mouth every evening.     empagliflozin (JARDIANCE) 10 MG TABS tablet TAKE 1 TABLET BY MOUTH DAILY BEFORE BREAKFAST. 90 tablet 3   escitalopram (LEXAPRO) 20 MG tablet Take 20 mg by mouth in the morning. (Patient not taking: Reported on 06/27/2023)     gabapentin (NEURONTIN) 300 MG capsule Take 1 capsule (300 mg total) by mouth daily. (Patient not taking: Reported on 06/27/2023) 30 capsule 0   levocetirizine (XYZAL) 5 MG tablet Take by mouth. (Patient not taking: Reported on 06/27/2023)     pregabalin (LYRICA) 75 MG capsule Take by mouth. (Patient not taking: Reported on 06/27/2023)       Discharge Medications: Please see discharge summary for a list of discharge medications.  Relevant Imaging Results:  Relevant Lab Results:   Additional Information SSN [id number]  Princella Ion, LCSW

## 2023-07-07 NOTE — Progress Notes (Signed)
PT Cancellation Note  Patient Details Name: Nathan Mcdaniel MRN: [id number] DOB: 08-25-1940   Cancelled Treatment:    Reason Eval/Treat Not Completed: Patient's level of consciousness PT attempted  in AM, patient  restless in bed with roll belt, being fed breakfast. Patient is appropriate for memory care and not considered a candidate for skilled PT. Will  attempt tomorrow to see how patient is functioning/MS/ ability to follow. Blanchard Kelch PT Acute Rehabilitation Services Office [phone] Weekend pager-[phone]  [name] 07/07/2023, 3:56 PM

## 2023-07-07 NOTE — H&P (Signed)
**Note Nathan Mcdaniel** History and Physical  [name]:[id number] DOB: October 21, 1940 DOA: 06/27/2023  PCP: Pollyann Samples, MD   Chief Complaint: Anemia  HPI: Nathan Mcdaniel is a 82 y.o. male with medical history significant for atrial fibrillation, dementia, Saint Jude mechanical valve replacement 2002 on Coumadin being admitted to the hospital for observation due to rectus sheath hematoma and anemia.  Apparently the patient was brought to the emergency department by his family due to increasing agitation on 11/23, at which time lab work was unremarkable, and patient was admitted in the emergency department since that day, awaiting [facility] psych placement.  However, he was noted to have a right flank hematoma, and now has developed some anemia.  ER provider states that he was restrained intermittently during his stay in the emergency department.  Currently he is pleasantly confused, sitting in front of his dinner tray with a sitter.  He has no complaints.  Able to tell me his name, but not much else.  Due to noted right flank hematoma, he had CT scan today as noted below.  ER provider discussed with general surgery, who recommends admission to medical service for monitoring of his hemoglobin.  Did not specifically recommend INR reversal, or surgical intervention.  Review of Systems: Please see HPI for pertinent positives and negatives. A complete 10 system review of systems are otherwise negative.  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   History of skin cancer    Hypertension    Major depressive disorder    Memory disorder     Mild neurocognitive disorder, 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   Mitral insufficiency    Mobitz type 1 second degree atrioventricular block    Palpitations    Pseudoaneurysm    of forehead   Pure hypercholesterolemia 10/07/2010   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   Stroke 06/09/2018   Brain MRI revealed two remote tiny infarcts in the left cerebellum   Vitamin D deficiency 10/07/2010   Past Surgical History:  Procedure Laterality Date   CATARACT EXTRACTION, BILATERAL     CHOLECYSTECTOMY     COLON SURGERY     with diversion and resection   FALSE ANEURYSM REPAIR N/A 09/23/2019   Procedure: REPAIR FALSE ANEURYSM FOREHEAD;  Surgeon: Sherren Kerns, MD;  Location: [facility] OR;  Service: Vascular;  Laterality: N/A;   I & D EXTREMITY Left 01/28/2023   Procedure: LEFT LEG DEBRIDEMENT;  Surgeon: Nadara Mustard, MD;  Location: [facility] OR;  Service: Orthopedics;  Laterality: Left;   MITRAL VALVE REPLACEMENT  2002   with a st.jude mechanical valve   PROSTATE BIOPSY     TRIGGER FINGER RELEASE Left 06/04/2021   Procedure: RELEASE TRIGGER FINGER/A-1 PULLEY LEFT RING FINGER;  Surgeon: Cindee Salt, MD;  Location: [facility];  Service: Orthopedics;  Laterality: Left;    Social History:  reports that he quit smoking about 43 years ago. His smoking use included cigarettes. He started smoking about 61 years ago. He has a 14.4 pack-year smoking history. He has never used smokeless tobacco. He reports that he does not currently use alcohol. He  reports that he does not use drugs.   Allergies  Allergen Reactions   Aricept [Donepezil Hcl] Other (See Comments)    Hallucinations    Namenda [Memantine Hcl] Other (See Comments)    Hallucinations    Olanzapine Anxiety    Agitation, aggression.    Family History  Problem Relation Age of Onset   Heart failure Mother    Hypertension Mother    Lung cancer Sister      Prior to Admission medications   Medication Sig Start Date End Date Taking?  Authorizing Provider  acetaminophen (TYLENOL) 325 MG tablet Take 650 mg by mouth every 6 (six) hours as needed for moderate pain or headache.   Yes [provider]  LORazepam (ATIVAN) 0.5 MG tablet Take 0.5 mg by mouth as needed for anxiety or sedation.   Yes [provider]  Multiple Vitamin (MULTIVITAMIN WITH MINERALS) TABS tablet Take 1 tablet by mouth in the morning.   Yes [provider]  OLANZapine zydis (ZYPREXA) 5 MG disintegrating tablet Take 5 mg by mouth daily. 06/25/23  Yes [provider]  tamsulosin (FLOMAX) 0.4 MG CAPS capsule Take 0.4 mg by mouth at bedtime.   Yes [provider]  warfarin (COUMADIN) 2.5 MG tablet Take 2.5 mg by mouth every evening.   Yes [provider]  empagliflozin (JARDIANCE) 10 MG TABS tablet TAKE 1 TABLET BY MOUTH DAILY BEFORE BREAKFAST. 06/29/23   Carlos Levering, NP  escitalopram (LEXAPRO) 20 MG tablet Take 20 mg by mouth in the morning. Patient not taking: Reported on 06/27/2023    [provider]  gabapentin (NEURONTIN) 300 MG capsule Take 1 capsule (300 mg total) by mouth daily. Patient not taking: Reported on 06/27/2023 05/29/23   Daria Pastures, MD  levocetirizine (XYZAL) 5 MG tablet Take by mouth. Patient not taking: Reported on 06/27/2023 05/21/23   [provider]  pregabalin (LYRICA) 75 MG capsule Take by mouth. Patient not taking: Reported on 06/27/2023 06/17/23   [provider]    Physical Exam: BP 135/79 (BP Location: Left Arm)   Pulse 100   Temp 98.3 F (36.8 C) (Oral)   Resp 16   Ht 5\' 6"  (1.676 m)   Wt 72.6 kg   SpO2 96%   BMI 25.82 kg/m   General:  Alert, oriented only to self, thin elderly gentleman appearing his stated age.  Looks very comfortable, sitting up in a chair. Eyes: EOMI, clear conjuctivae, white sclerea Neck: supple, no masses, trachea mildline  Cardiovascular: RRR, no murmurs or rubs, no peripheral edema  Respiratory: clear  to auscultation bilaterally, no wheezes, no crackles  Abdomen: soft, nontender, nondistended, normal bowel tones heard, he has a right flank hematoma, extending down into his groin.  Skin: dry, no rashes  Musculoskeletal: no joint effusions, normal range of motion  Psychiatric: appropriate affect, normal speech  Neurologic: extraocular muscles intact, clear speech, moving all extremities with intact sensorium         Labs on Admission:  Basic Metabolic Panel: Recent Labs  Lab 07/02/23 0759 07/07/23 1242  NA 138 139  K 4.3 4.0  CL 101 101  CO2 28 31  GLUCOSE 100* 143*  BUN 25* 41*  CREATININE 1.00 0.99  CALCIUM 8.6* 8.8*   Liver Function Tests: Recent Labs  Lab 07/02/23 0759 07/07/23 1242  AST 21 27  ALT 14 17  ALKPHOS 55 54  BILITOT 1.1 1.6*  PROT 6.2* 6.3*  ALBUMIN 3.8 3.7   No results  for input(s): "LIPASE", "AMYLASE" in the last 168 hours. Recent Labs  Lab 07/02/23 0759  AMMONIA <10   CBC: Recent Labs  Lab 07/02/23 0759 07/03/23 0611 07/05/23 1308 07/07/23 1000  WBC 6.2 9.8 7.4 6.5  NEUTROABS  --   --   --  5.0  HGB 14.3 12.6* 10.7* 10.0*  HCT 46.2 38.9* 33.5* 32.3*  MCV 91.7 89.6 91.0 92.0  PLT 252 259 231 279   Cardiac Enzymes: No results for input(s): "CKTOTAL", "CKMB", "CKMBINDEX", "TROPONINI" in the last 168 hours.  BNP (last 3 results) No results for input(s): "BNP" in the last 8760 hours.  ProBNP (last 3 results) No results for input(s): "PROBNP" in the last 8760 hours.  CBG: No results for input(s): "GLUCAP" in the last 168 hours.  Radiological Exams on Admission: CT ABDOMEN PELVIS W CONTRAST  Result Date: 07/07/2023 CLINICAL DATA:  Retroperitoneal hematoma, known or suspected. Altered mental status. Head trauma. On anticoagulation. No reported history of malignancy. EXAM: CT ABDOMEN AND PELVIS WITH CONTRAST TECHNIQUE: Multidetector CT imaging of the abdomen and pelvis was performed using the standard protocol following bolus  administration of intravenous contrast. RADIATION DOSE REDUCTION: This exam was performed according to the departmental dose-optimization program which includes automated exposure control, adjustment of the mA and/or kV according to patient size and/or use of iterative reconstruction technique. CONTRAST:  OMNIPAQUE IOHEXOL 300 MG/ML  SOLN COMPARISON:  None Available. FINDINGS: Lower chest: Clear lung bases. No significant pleural or pericardial effusion. The heart is enlarged status post median sternotomy and mitral valve replacement. Hepatobiliary: The liver is normal in density without highly suspicious focal abnormality. There are multiple hepatic cysts, measuring up to 2.3 cm in the left lobe on image 12/5. Inferiorly in the right lobe, there is a peripherally enhancing 1.1 cm low-density lesion on image 27/5 which becomes less evident on the delayed images, likely reflecting a hemangioma. Status post cholecystectomy. No evidence of biliary dilatation. Pancreas: Mild atrophy. No focal abnormality, ductal dilatation or surrounding inflammation. Spleen: Normal in size without focal abnormality. Adrenals/Urinary Tract: Both adrenal glands appear normal. No evidence of urinary tract calculus, suspicious renal lesion or hydronephrosis. Multiple bilateral renal cysts, measuring up to 7.7 cm in the upper pole the right kidney for which no specific follow-up imaging recommended. The urinary bladder appears unremarkable for its degree of distention. Stomach/Bowel: No enteric contrast administered. The stomach appears unremarkable for its degree of distension. No evidence of bowel wall thickening, distention or surrounding inflammatory change. The appendix appears normal. Vascular/Lymphatic: There are no enlarged abdominal or pelvic lymph nodes. Aortic and branch vessel atherosclerosis without evidence of aneurysm or large vessel occlusion. Reproductive: The prostate gland is markedly enlarged and heterogeneous,  measuring up to 8.0 x 6.4 cm transverse. Other: Heterogeneous enlargement of the right rectus abdominus muscle, measuring up to 8.2 x 6.5 cm transverse on image 64/5 and up to 13.9 cm in length on sagittal image 71/12 with intermediate density, likely reflecting a hematoma. Mild contralateral enlargement of the left rectus sheath. Surrounding soft tissue stranding in the subcutaneous and intraperitoneal fat. There is mild nonspecific perirectal soft tissue stranding, but no significant retroperitoneal hematoma. No evidence of hemoperitoneum or pneumoperitoneum. Possible herniorrhaphy changes in the left inguinal region. Musculoskeletal: No acute or significant osseous findings. Chronic appearing Schmorl's node involving the superior endplate of T12. Multilevel spondylosis. IMPRESSION: 1. Heterogeneous enlargement of the right rectus abdominus muscle, likely reflecting a hematoma. Mild contralateral enlargement of the left rectus sheath. Surrounding soft tissue  stranding in the subcutaneous and intraperitoneal fat. 2. No evidence of significant retroperitoneal hematoma or hemoperitoneum. 3. No other acute findings. 4. Markedly enlarged and heterogeneous prostate gland. 5. Small peripherally enhancing low-density lesion in the right lobe of the liver, likely reflecting a hemangioma. This could be more definitively characterized with MRI without and with contrast, if clinically warranted. Additional simple appearing hepatic and renal cysts bilaterally. 6.  Aortic Atherosclerosis (ICD10-I70.0). Electronically Signed   By: Carey Bullocks M.D.   On: 07/07/2023 15:19   CT Head Wo Contrast  Result Date: 07/07/2023 CLINICAL DATA:  Head trauma, altered mental status (Ped 0-17y) EXAM: CT HEAD WITHOUT CONTRAST TECHNIQUE: Contiguous axial images were obtained from the base of the skull through the vertex without intravenous contrast. RADIATION DOSE REDUCTION: This exam was performed according to the departmental  dose-optimization program which includes automated exposure control, adjustment of the mA and/or kV according to patient size and/or use of iterative reconstruction technique. COMPARISON:  None Available. FINDINGS: Brain: Diffuse cerebral atrophy. No acute intracranial abnormality. Specifically, no hemorrhage, hydrocephalus, mass lesion, acute infarction, or significant intracranial injury. Vascular: No hyperdense vessel or unexpected calcification. Skull: No acute calvarial abnormality. Sinuses/Orbits: No acute findings Other: None IMPRESSION: No acute intracranial abnormality. Electronically Signed   By: Charlett Nose M.D.   On: 07/07/2023 14:48    Assessment/Plan 82 year old gentleman with dementia with behavioral disturbance, atrial fibrillation, diet-controlled diabetes, mechanical mitral valve on Coumadin initially admitted to the emergency department for [facility] psych placement, now being admitted for observation due to right sided rectus sheath hematoma and associated anemia.  Right sided rectus sheath hematoma and associated anemia-hemodynamically stable, presumably may be a result of restraints used in the setting of his anticoagulation.  His INR is therapeutic.  No evidence of active bleeding, no complaints of pain, hemoglobin gradually drifting downwards.  ER provider discussed with general surgery, who recommends observation only at this time. -Observation admission -Continue to monitor hemoglobin every 8 hours -Given his mechanical mitral valve, would not discontinue anticoagulation unless evidence of life-threatening bleeding or other complication.  Hemoglobin has essentially been stable since 12/1, so for now we will continue Coumadin and monitor hemoglobin closely  Mechanical mitral valve-status post [facility] Jude mechanical valve in 2002 -Goal INR 2.5-3.5 -Continue Coumadin per pharmacy consult  Diet-controlled diabetes-continue Jardiance, carb controlled diet, moderate dose sliding scale,  check A1c  Dementia with behavioral disturbance-started on Depakote 125 mg p.o. 3 times daily for mood stability, note this can affect INR.  Zyprexa has been discontinued since he seemed to get more agitated on it.  He is awaiting Geri psych placement.  DVT prophylaxis: On coumadin as above     Code Status: Do not attempt resuscitation (DNR) PRE-ARREST INTERVENTIONS DESIRED  Consults called: None  Admission status: Observation  Time spent: 46 minutes  Sinda Leedom Sharlette Dense MD [facility] Pager [phone]  If 7PM-7AM, please contact night-coverage [email or web address] Password [facility]  07/07/2023, 5:26 PM

## 2023-07-07 NOTE — Progress Notes (Signed)
30 Day PASRR Note   Patient Details  Name: Nathan Mcdaniel Date of Birth: Aug 13, 1940   Transition of Care  [facility]) CM/SW Contact:    Princella Ion, LCSW Phone Number: 07/07/2023, 9:31 AM  To Whom It May Concern:  Please be advised that this patient will require a short-term nursing home stay - anticipated 30 days or less for rehabilitation and strengthening.   The plan is for return home.

## 2023-07-07 NOTE — ED Provider Notes (Signed)
Patient was signed out to me at 1500 by Dr. Dalene Seltzer pending CT read.  In short this is an 82 year old male who presented to the emergency department with altered mental status were related to his dementia with plan for memory care placement.  Over the last several days he has had decreasing hemoglobin and was found to have contusion across his right flank.  He had CT scan performed that did show that he has bilateral rectus sheath hematomas. He is on coumadin for A fib and mitral valve replacement. He has been hemodynamically stable, hemoglobin slowly decreasing over the last several days.  No active extravasation.  Will plan to discuss with surgery for recommendations on observation as an inpatient versus outpatient management.  Clinical Course as of 07/07/23 1656  Tue Jul 07, 2023  1654 I spoke with general surgery who states does not need surgery or trauma eval at this time but does recommend admission to medicine for observation and hemoglobin trending. [VK]    Clinical Course User Index [VK] Rexford Maus, DO      Rexford Maus, DO 07/07/23 1656

## 2023-07-07 NOTE — NC FL2 (Signed)
[place] MEDICAID FL2 LEVEL OF CARE FORM     IDENTIFICATION  Patient Name: Nathan Mcdaniel Birthdate: 02/19/1941 Sex: male Admission Date (Current Location): 06/27/2023  [facility] and [place] Number:  Producer, television/film/video and Address:  [facility],  [address] [place], [place] [address]      Provider Number: [id number]  Attending Physician Name and Address:  Alvira Monday, MD  Relative Name and Phone Number:  Zeitz,[name] (Spouse)  [phone], Enzo Bi (daughter) [phone]    Current Level of Care: Hospital Recommended Level of Care: Skilled Nursing Facility Prior Approval Number:    Date Approved/Denied:   PASRR Number: PENDING  Discharge Plan: SNF    Current Diagnoses: Patient Active Problem List   Diagnosis Date Noted   Dementia with behavioral disturbance (HCC) 06/27/2023   Hematoma of left lower leg 01/28/2023   Gross hematuria 02/19/2020   Mild neurocognitive disorder, 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   Major depressive disorder    Generalized anxiety disorder    Long term (current) use of anticoagulants 12/17/2017   Mobitz type 1 second degree atrioventricular block    Abnormal ECG    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   Atrial fibrillation 10/14/2011   Hypertension    Palpitations    Mitral insufficiency    Elevated prostate specific antigen (PSA) 11/26/2010   Pure hypercholesterolemia 10/07/2010   Asymptomatic varicose veins 10/07/2010   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   Vitamin D deficiency 10/07/2010   Allergic rhinitis 10/24/2009   Bunion 05/21/2009   Corn or callus 12/26/2008   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    Orientation RESPIRATION BLADDER Height & Weight     Self  Normal Incontinent Weight: 160 lb (72.6 kg) Height:  5\' 6"  (167.6 cm)  BEHAVIORAL SYMPTOMS/MOOD NEUROLOGICAL BOWEL NUTRITION STATUS      Incontinent Diet (Regular)  AMBULATORY STATUS COMMUNICATION OF  NEEDS Skin   Limited Assist Verbally Normal                       Personal Care Assistance Level of Assistance  Bathing, Feeding, Dressing Bathing Assistance: Maximum assistance Feeding assistance: Limited assistance Dressing Assistance: Limited assistance     Functional Limitations Info  Sight, Hearing, Speech Sight Info: Adequate Hearing Info: Adequate Speech Info: Adequate    SPECIAL CARE FACTORS FREQUENCY                       Contractures Contractures Info: Not present    Additional Factors Info  Code Status, Allergies Code Status Info: Full Allergies Info: Aricept (Donepezil Hcl)  Namenda (Memantine Hcl)  Olanzapine           Current Medications (07/07/2023):  This is the current hospital active medication list Current Facility-Administered Medications  Medication Dose Route Frequency Provider Last Rate Last Admin   acetaminophen (TYLENOL) tablet 650 mg  650 mg Oral Q4H PRN Fayrene Helper, PA-C   650 mg at 07/05/23 2054   alum & mag hydroxide-simeth (MAALOX/MYLANTA) 200-200-20 MG/5ML suspension 30 mL  30 mL Oral Q6H PRN Fayrene Helper, PA-C       divalproex (DEPAKOTE SPRINKLE) capsule 125 mg  125 mg Oral TID Ophelia Shoulder E, NP   125 mg at 07/07/23 0937   escitalopram (LEXAPRO) tablet 20 mg  20 mg Oral q AM Fayrene Helper, PA-C   20 mg at 07/07/23 0936   gabapentin (NEURONTIN) capsule 300 mg  300 mg Oral Daily Laveda Norman,  Greta Doom, PA-C   300 mg at 07/07/23 1610   LORazepam (ATIVAN) tablet 0.5 mg  0.5 mg Oral Q8H PRN Dahlia Byes C, NP   0.5 mg at 07/06/23 2113   ondansetron (ZOFRAN) tablet 4 mg  4 mg Oral Q8H PRN Fayrene Helper, PA-C       risperiDONE (RISPERDAL M-TABS) disintegrating tablet 1 mg  1 mg Oral Q8H PRN Dahlia Byes C, NP   1 mg at 07/06/23 2113   warfarin (COUMADIN) tablet 2.5 mg  2.5 mg Oral ONCE-1600 Alvira Monday, MD       Warfarin - Pharmacist Dosing Inpatient   Does not apply q1600 Norva Pavlov [facility]   Given at 07/06/23 1606   Current  Outpatient Medications  Medication Sig Dispense Refill   acetaminophen (TYLENOL) 325 MG tablet Take 650 mg by mouth every 6 (six) hours as needed for moderate pain or headache.     LORazepam (ATIVAN) 0.5 MG tablet Take 0.5 mg by mouth as needed for anxiety or sedation.     Multiple Vitamin (MULTIVITAMIN WITH MINERALS) TABS tablet Take 1 tablet by mouth in the morning.     OLANZapine zydis (ZYPREXA) 5 MG disintegrating tablet Take 5 mg by mouth daily.     tamsulosin (FLOMAX) 0.4 MG CAPS capsule Take 0.4 mg by mouth at bedtime.     warfarin (COUMADIN) 2.5 MG tablet Take 2.5 mg by mouth every evening.     empagliflozin (JARDIANCE) 10 MG TABS tablet TAKE 1 TABLET BY MOUTH DAILY BEFORE BREAKFAST. 90 tablet 3   escitalopram (LEXAPRO) 20 MG tablet Take 20 mg by mouth in the morning. (Patient not taking: Reported on 06/27/2023)     gabapentin (NEURONTIN) 300 MG capsule Take 1 capsule (300 mg total) by mouth daily. (Patient not taking: Reported on 06/27/2023) 30 capsule 0   levocetirizine (XYZAL) 5 MG tablet Take by mouth. (Patient not taking: Reported on 06/27/2023)     pregabalin (LYRICA) 75 MG capsule Take by mouth. (Patient not taking: Reported on 06/27/2023)       Discharge Medications: Please see discharge summary for a list of discharge medications.  Relevant Imaging Results:  Relevant Lab Results:   Additional Information SSN: [id number]  Princella Ion, LCSW

## 2023-07-08 DIAGNOSIS — E7800 Pure hypercholesterolemia, unspecified: Secondary | ICD-10-CM | POA: Diagnosis present

## 2023-07-08 DIAGNOSIS — F03911 Unspecified dementia, unspecified severity, with agitation: Secondary | ICD-10-CM | POA: Diagnosis not present

## 2023-07-08 DIAGNOSIS — D62 Acute posthemorrhagic anemia: Secondary | ICD-10-CM | POA: Diagnosis present

## 2023-07-08 DIAGNOSIS — F01B2 Vascular dementia, moderate, with psychotic disturbance: Secondary | ICD-10-CM | POA: Diagnosis present

## 2023-07-08 DIAGNOSIS — Z87891 Personal history of nicotine dependence: Secondary | ICD-10-CM | POA: Diagnosis not present

## 2023-07-08 DIAGNOSIS — I482 Chronic atrial fibrillation, unspecified: Secondary | ICD-10-CM | POA: Diagnosis present

## 2023-07-08 DIAGNOSIS — S301XXA Contusion of abdominal wall, initial encounter: Secondary | ICD-10-CM | POA: Diagnosis not present

## 2023-07-08 DIAGNOSIS — F01B18 Vascular dementia, moderate, with other behavioral disturbance: Secondary | ICD-10-CM | POA: Diagnosis present

## 2023-07-08 DIAGNOSIS — Z515 Encounter for palliative care: Secondary | ICD-10-CM | POA: Diagnosis not present

## 2023-07-08 DIAGNOSIS — Z66 Do not resuscitate: Secondary | ICD-10-CM | POA: Diagnosis present

## 2023-07-08 DIAGNOSIS — Z7984 Long term (current) use of oral hypoglycemic drugs: Secondary | ICD-10-CM | POA: Diagnosis not present

## 2023-07-08 DIAGNOSIS — F05 Delirium due to known physiological condition: Secondary | ICD-10-CM | POA: Diagnosis present

## 2023-07-08 DIAGNOSIS — Z7901 Long term (current) use of anticoagulants: Secondary | ICD-10-CM | POA: Diagnosis not present

## 2023-07-08 DIAGNOSIS — I340 Nonrheumatic mitral (valve) insufficiency: Secondary | ICD-10-CM | POA: Diagnosis present

## 2023-07-08 DIAGNOSIS — I10 Essential (primary) hypertension: Secondary | ICD-10-CM | POA: Diagnosis present

## 2023-07-08 DIAGNOSIS — Z79899 Other long term (current) drug therapy: Secondary | ICD-10-CM | POA: Diagnosis not present

## 2023-07-08 DIAGNOSIS — Z7189 Other specified counseling: Secondary | ICD-10-CM | POA: Diagnosis not present

## 2023-07-08 DIAGNOSIS — Z781 Physical restraint status: Secondary | ICD-10-CM | POA: Diagnosis not present

## 2023-07-08 DIAGNOSIS — E119 Type 2 diabetes mellitus without complications: Secondary | ICD-10-CM | POA: Diagnosis present

## 2023-07-08 DIAGNOSIS — T45515A Adverse effect of anticoagulants, initial encounter: Secondary | ICD-10-CM | POA: Diagnosis present

## 2023-07-08 DIAGNOSIS — F03918 Unspecified dementia, unspecified severity, with other behavioral disturbance: Secondary | ICD-10-CM | POA: Diagnosis not present

## 2023-07-08 DIAGNOSIS — F03B11 Unspecified dementia, moderate, with agitation: Secondary | ICD-10-CM | POA: Diagnosis not present

## 2023-07-08 DIAGNOSIS — N401 Enlarged prostate with lower urinary tract symptoms: Secondary | ICD-10-CM | POA: Diagnosis present

## 2023-07-08 DIAGNOSIS — F01B11 Vascular dementia, moderate, with agitation: Secondary | ICD-10-CM | POA: Diagnosis present

## 2023-07-08 DIAGNOSIS — Z85828 Personal history of other malignant neoplasm of skin: Secondary | ICD-10-CM | POA: Diagnosis not present

## 2023-07-08 DIAGNOSIS — M7981 Nontraumatic hematoma of soft tissue: Secondary | ICD-10-CM | POA: Diagnosis present

## 2023-07-08 DIAGNOSIS — D6832 Hemorrhagic disorder due to extrinsic circulating anticoagulants: Secondary | ICD-10-CM | POA: Diagnosis present

## 2023-07-08 DIAGNOSIS — R64 Cachexia: Secondary | ICD-10-CM | POA: Diagnosis present

## 2023-07-08 DIAGNOSIS — Z8249 Family history of ischemic heart disease and other diseases of the circulatory system: Secondary | ICD-10-CM | POA: Diagnosis not present

## 2023-07-08 LAB — BASIC METABOLIC PANEL
Anion gap: 10 (ref 5–15)
BUN: 42 mg/dL — ABNORMAL HIGH (ref 8–23)
CO2: 30 mmol/L (ref 22–32)
Calcium: 8.7 mg/dL — ABNORMAL LOW (ref 8.9–10.3)
Chloride: 100 mmol/L (ref 98–111)
Creatinine, Ser: 1.02 mg/dL (ref 0.61–1.24)
GFR, Estimated: >60 mL/min (ref >60)
Glucose, Bld: 116 mg/dL — ABNORMAL HIGH (ref 70–99)
Potassium: 3.6 mmol/L (ref 3.5–5.1)
Sodium: 140 mmol/L (ref 135–145)

## 2023-07-08 LAB — HEMOGLOBIN AND HEMATOCRIT, BLOOD
HCT: 27.8 % — ABNORMAL LOW (ref 39.0–52.0)
HCT: 26.4 % — ABNORMAL LOW (ref 39.0–52.0)
Hemoglobin: 8.8 g/dL — ABNORMAL LOW (ref 13.0–17.0)
Hemoglobin: 8.5 g/dL — ABNORMAL LOW (ref 13.0–17.0)

## 2023-07-08 LAB — HEMOGLOBIN A1C
Hgb A1c MFr Bld: 4.7 % — ABNORMAL LOW (ref 4.8–5.6)
Mean Plasma Glucose: 88.19 mg/dL

## 2023-07-08 LAB — CBC
HCT: 30.8 % — ABNORMAL LOW (ref 39.0–52.0)
Hemoglobin: 9.8 g/dL — ABNORMAL LOW (ref 13.0–17.0)
MCH: 28.9 pg (ref 26.0–34.0)
MCHC: 31.8 g/dL (ref 30.0–36.0)
MCV: 90.9 fL (ref 80.0–100.0)
Platelets: 333 10*3/uL (ref 150–400)
RBC: 3.39 MIL/uL — ABNORMAL LOW (ref 4.22–5.81)
RDW: 14.4 % (ref 11.5–15.5)
WBC: 7.8 10*3/uL (ref 4.0–10.5)
nRBC: 0 % (ref 0.0–0.2)

## 2023-07-08 LAB — GLUCOSE, CAPILLARY
Glucose-Capillary: 97 mg/dL (ref 70–99)
Glucose-Capillary: 138 mg/dL — ABNORMAL HIGH (ref 70–99)
Glucose-Capillary: 113 mg/dL — ABNORMAL HIGH (ref 70–99)
Glucose-Capillary: 93 mg/dL (ref 70–99)

## 2023-07-08 LAB — PROTIME-INR
INR: 3.3 — ABNORMAL HIGH (ref 0.8–1.2)
Prothrombin Time: 34 s — ABNORMAL HIGH (ref 11.4–15.2)

## 2023-07-08 LAB — MAGNESIUM: Magnesium: 2.3 mg/dL (ref 1.7–2.4)

## 2023-07-08 MED ORDER — WARFARIN SODIUM 2 MG PO TABS
2.0000 mg | ORAL_TABLET | Freq: Once | ORAL | Status: AC
  Administered 2023-07-08: 2 mg via ORAL
  Filled 2023-07-08: qty 1

## 2023-07-08 NOTE — Progress Notes (Signed)
PT Cancellation Note  Patient Details Name: Nathan Mcdaniel MRN: [id number] DOB: 1941-03-12   Cancelled Treatment:    Reason Eval/Treat Not Completed: Fatigue/lethargy limiting ability to participate Patient is resting  very well. Will follow for PT if indicated.  Blanchard Kelch PT Acute Rehabilitation Services Office [phone] Weekend pager-[phone]   [name] 07/08/2023, 4:12 PM

## 2023-07-08 NOTE — [place] FL2 (Signed)
[place] MEDICAID FL2 LEVEL OF CARE FORM     IDENTIFICATION  Patient Name: Nathan Mcdaniel Birthdate: 20-Apr-1941 Sex: male Admission Date (Current Location): 06/27/2023  [facility] and [place] Number:  Producer, television/film/video and Address:  [facility],  [address] [place], [place] [address]      Provider Number: [id number]  Attending Physician Name and Address:  Marinda Elk, MD  Relative Name and Phone Number:  Ramirez Prawl) [phone]    Current Level of Care: Hospital Recommended Level of Care: Memory Care Prior Approval Number:    Date Approved/Denied:   PASRR Number: [id number]  Discharge Plan: Other (Comment) (Memory care)    Current Diagnoses: Patient Active Problem List   Diagnosis Date Noted   Hematoma of rectus sheath 07/08/2023   Nontraumatic rectus hematoma 07/07/2023   Dementia with behavioral disturbance (HCC) 06/27/2023   Hematoma of left lower leg 01/28/2023   Gross hematuria 02/19/2020   Mild neurocognitive disorder, 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   Major depressive disorder    Generalized anxiety disorder    Long term (current) use of anticoagulants 12/17/2017   Mobitz type 1 second degree atrioventricular block    Abnormal ECG    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   Atrial fibrillation 10/14/2011   Hypertension    Palpitations    Mitral insufficiency    Elevated prostate specific antigen (PSA) 11/26/2010   Pure hypercholesterolemia 10/07/2010   Asymptomatic varicose veins 10/07/2010   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   Vitamin D deficiency 10/07/2010   Allergic rhinitis 10/24/2009   Bunion 05/21/2009   Corn or callus 12/26/2008   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    Orientation RESPIRATION BLADDER Height & Weight     Self  Normal Incontinent Weight: 72.6 kg Height:  5\' 6"  (167.6 cm)  BEHAVIORAL SYMPTOMS/MOOD NEUROLOGICAL BOWEL NUTRITION STATUS      Incontinent  Diet (Carb Modified)  AMBULATORY STATUS COMMUNICATION OF NEEDS Skin   Limited Assist Verbally Normal                       Personal Care Assistance Level of Assistance  Bathing, Feeding, Dressing Bathing Assistance: Maximum assistance Feeding assistance: Independent Dressing Assistance: Limited assistance     Functional Limitations Info  Sight, Hearing, Speech Sight Info: Adequate Hearing Info: Adequate Speech Info: Adequate    SPECIAL CARE FACTORS FREQUENCY                       Contractures Contractures Info: Not present    Additional Factors Info  Code Status Code Status Info: DNR Allergies Info: Aricept (Donepezil Hcl), Namenda (Memantine Hcl), Olanzapine           Current Medications (07/08/2023):  This is the current hospital active medication list Current Facility-Administered Medications  Medication Dose Route Frequency Provider Last Rate Last Admin   acetaminophen (TYLENOL) tablet 650 mg  650 mg Oral Q4H PRN Fayrene Helper, PA-C   650 mg at 07/05/23 2054   albuterol (PROVENTIL) (2.5 MG/3ML) 0.083% nebulizer solution 2.5 mg  2.5 mg Nebulization Q2H PRN Kirby Crigler, Mir M, MD       alum & mag hydroxide-simeth (MAALOX/MYLANTA) 200-200-20 MG/5ML suspension 30 mL  30 mL Oral Q6H PRN Fayrene Helper, PA-C       divalproex (DEPAKOTE SPRINKLE) capsule 125 mg  125 mg Oral TID Chales Abrahams, NP   125 mg at 07/08/23 0939   empagliflozin (  JARDIANCE) tablet 10 mg  10 mg Oral QAC breakfast Kirby Crigler, Mir M, MD   10 mg at 07/08/23 0939   escitalopram (LEXAPRO) tablet 20 mg  20 mg Oral q AM Fayrene Helper, PA-C   20 mg at 07/08/23 1308   feeding supplement (ENSURE ENLIVE / ENSURE PLUS) liquid 237 mL  237 mL Oral BID BM Alvira Monday, MD   237 mL at 07/07/23 1810   gabapentin (NEURONTIN) capsule 300 mg  300 mg Oral Daily Fayrene Helper, PA-C   300 mg at 07/08/23 6578   insulin aspart (novoLOG) injection 0-15 Units  0-15 Units Subcutaneous TID WC Kirby Crigler Mir M, MD   2 Units  at 07/08/23 1219   insulin aspart (novoLOG) injection 0-5 Units  0-5 Units Subcutaneous QHS Kirby Crigler, Mir M, MD       LORazepam (ATIVAN) tablet 0.5 mg  0.5 mg Oral Q8H PRN Dahlia Byes C, NP   0.5 mg at 07/08/23 1219   ondansetron (ZOFRAN) tablet 4 mg  4 mg Oral Q8H PRN Fayrene Helper, PA-C       risperiDONE (RISPERDAL M-TABS) disintegrating tablet 1 mg  1 mg Oral Q8H PRN Dahlia Byes C, NP   1 mg at 07/06/23 2113   senna-docusate (Senokot-S) tablet 1 tablet  1 tablet Oral QHS PRN Maryln Gottron, MD       tamsulosin [facility]) capsule 0.4 mg  0.4 mg Oral QHS Kirby Crigler, Mir M, MD   0.4 mg at 07/07/23 2136   warfarin (COUMADIN) tablet 2 mg  2 mg Oral ONCE-1600 Norva Pavlov, [facility]       Warfarin - Pharmacist Dosing Inpatient   Does not apply q1600 Norva Pavlov [facility]   Given at 07/06/23 1606     Discharge Medications: Please see discharge summary for a list of discharge medications.  Relevant Imaging Results:  Relevant Lab Results:   Additional Information SS#[id number] [place], [place]

## 2023-07-08 NOTE — TOC Initial Note (Signed)
Transition of Care [facility]) - Initial/Assessment Note    Patient Details  Name: Nathan Mcdaniel MRN: [id number] Date of Birth: 06/07/1941  [facility]) CM/SW Contact:    Lanier Clam, RN Phone Number: 07/08/2023, 2:43 PM  Clinical Narrative:Spoke to spouse Wynona Canes about d/c plans-agree to memory care-TC Always best care rep Stephen-following with memory care facilities-[facility], & [facility] to assess patient this week-pvt pay. [facility] provides aid/attendance services. Await outcome of assessments.   Noted pasrr received.                Expected Discharge Plan: Memory Care Barriers to Discharge: Continued Medical Work up   Patient Goals and CMS Choice Patient states their goals for this hospitalization and ongoing recovery are:: Memory care CMS Medicare.gov Compare Post Acute Care list provided to:: Patient Represenative (must comment) ([name](spouse)) Choice offered to / list presented to : Spouse [place] ownership interest in [facility].provided to:: Spouse    Expected Discharge Plan and Services   Discharge Planning Services: CM Consult Post Acute Care Choice:  (ALF/memory care) Living arrangements for the past 2 months: Single Family Home                                      Prior Living Arrangements/Services Living arrangements for the past 2 months: Single Family Home Lives with:: Spouse Patient language and need for interpreter reviewed:: Yes Do you feel safe going back to the place where you live?: Yes        Care giver support system in place?: Yes (comment)   Criminal Activity/Legal Involvement Pertinent to Current Situation/Hospitalization: No - Comment as needed  Activities of Daily Living   ADL Screening (condition at time of admission) Independently performs ADLs?: No Does the patient have a NEW difficulty with bathing/dressing/toileting/self-feeding that is expected to last >3 days?: No Does the patient have a NEW  difficulty with getting in/out of bed, walking, or climbing stairs that is expected to last >3 days?: No Does the patient have a NEW difficulty with communication that is expected to last >3 days?: No Is the patient deaf or have difficulty hearing?: No Does the patient have difficulty seeing, even when wearing glasses/contacts?: No Does the patient have difficulty concentrating, remembering, or making decisions?: Yes  Permission Sought/Granted Permission sought to share information with : Case Manager Permission granted to share information with : Yes, Verbal Permission Granted  Share Information with NAME: Case Manager           Emotional Assessment Appearance:: Appears stated age Attitude/Demeanor/Rapport: Gracious Affect (typically observed): Accepting Orientation: : Oriented to Self Alcohol / Substance Use: Not Applicable Psych Involvement: No (comment)  Admission diagnosis:  Nontraumatic rectus hematoma [M79.81] Hematoma of rectus sheath, initial encounter [S30.1XXA] Moderate dementia with agitation, unspecified dementia type (HCC) [F03.B11] Hematoma of rectus sheath [S30.1XXA] Patient Active Problem List   Diagnosis Date Noted   Hematoma of rectus sheath 07/08/2023   Nontraumatic rectus hematoma 07/07/2023   Dementia with behavioral disturbance (HCC) 06/27/2023   Hematoma of left lower leg 01/28/2023   Gross hematuria 02/19/2020   Mild neurocognitive disorder, 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   Major depressive disorder    Generalized anxiety disorder    Long term (current) use of anticoagulants 12/17/2017   Mobitz type 1 second degree atrioventricular block    Abnormal ECG    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  Atrial fibrillation 10/14/2011   Hypertension    Palpitations    Mitral insufficiency    Elevated prostate specific antigen (PSA) 11/26/2010   Pure hypercholesterolemia 10/07/2010   Asymptomatic varicose veins 10/07/2010   Benign prostatic hyperplasia  with urinary obstruction 10/07/2010   Vitamin D deficiency 10/07/2010   Allergic rhinitis 10/24/2009   Bunion 05/21/2009   Corn or callus 12/26/2008   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   PCP:  Pollyann Samples, MD Pharmacy:   [facility] [phone] [place], [place] - [address] [place] [address] Phone: [phone] Fax: [phone]     Social Determinants of Health (SDOH) Social History: SDOH Screenings   Food Insecurity: Patient Unable To Answer (07/08/2023)  Housing: Patient Unable To Answer (07/08/2023)  Transportation Needs: No Transportation Needs (03/10/2023)   Received from [facility]  Utilities: Patient Unable To Answer (07/08/2023)  Financial Resource Strain: Low Risk  (03/10/2023)   Received from [facility]  Physical Activity: Insufficiently Active (03/10/2023)   Received from [facility]  Social Connections: Moderately Integrated (03/10/2023)   Received from [facility]  Stress: No Stress Concern Present (03/10/2023)   Received from [facility]  Tobacco Use: Medium Risk (06/27/2023)   SDOH Interventions:     Readmission Risk Interventions     No data to display

## 2023-07-08 NOTE — Plan of Care (Signed)

## 2023-07-08 NOTE — Progress Notes (Signed)
[facility] PROGRESS NOTE    Progress Note  KINTE GRIEVES  GNF:[id number] DOB: 1940/10/27 DOA: 06/27/2023 PCP: Pollyann Samples, MD     Brief Narrative:   LOHITH KIEHN is an 82 y.o. male past medical history significant for chronic atrial fibrillation with a Saint Jude mechanical valve replacement in 2002 on Coumadin to the hospital for observation due to rectus sheath hematoma and anemia, apparently was brought to the emergency room by family on 06/27/2023 and has been waiting geriatric psych placement since then.  However was notice of right flank pain hematoma and has developed anemia on 07/02/2023 hemoglobin was 14 on admission it was 9.8.  Currently the patient is pleasantly confused due to a right flank hematoma CT scan of the abdomen pelvis was done and showed a large right rectus hematoma with mild contralateral enlargement of the left rectus sheath with surrounding soft tissue stranding no evidence of retroperitoneal hematoma or hemoperitoneum.  Assessment/Plan:   Rectus sheath hematoma associated with anemia: He is hemodynamically stable. His INR was elevated at 4.6. ED provider discussed with general surgery recommended observation and conservative management. His Coumadin dose was decreased, his INR now is 3.3.  Goal and INR is 2.5-3.5 Hemoglobin has remained stable 9-8.  Continue to monitor hemoglobin intermittently.  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  Diabetes mellitus type 2: Continue Jardiance. A1c of 4.7.  Dementia with behavioral disturbance (HCC) Continue Depakote, awaiting geriatric psych placement.   DVT prophylaxis: coumadin Family Communication:none Status is: Observation The patient remains OBS appropriate and will d/c before 2 midnights.    Code Status:     Code Status Orders  (From admission, onward)           Start     Ordered   07/07/23 1719  Do  not attempt resuscitation (DNR) Pre-Arrest Interventions Desired  Continuous       Question Answer Comment  If pulseless and not breathing No CPR or chest compressions.   In Pre-Arrest Conditions (Patient Has Pulse and Is Breathing) May intubate, use advanced airway interventions and cardioversion/ACLS medications if appropriate or indicated. May transfer to ICU.   Consent: Discussion documented in EHR or advanced directives reviewed      07/07/23 1719           Code Status History     Date Active Date Inactive Code Status Order ID Comments User Context   07/07/2023 1134 07/07/2023 1719 Do not attempt resuscitation (DNR) PRE-ARREST INTERVENTIONS DESIRED [id number]  Alvira Monday, MD ED   06/27/2023 1236 07/07/2023 1134 Full Code [id number]  Fayrene Helper, PA-C ED         IV Access:   Peripheral IV   Procedures and diagnostic studies:   CT ABDOMEN PELVIS W CONTRAST  Result Date: 07/07/2023 CLINICAL DATA:  Retroperitoneal hematoma, known or suspected. Altered mental status. Head trauma. On anticoagulation. No reported history of malignancy. EXAM: CT ABDOMEN AND PELVIS WITH CONTRAST TECHNIQUE: Multidetector CT imaging of the abdomen and pelvis was performed using the standard protocol following bolus administration of intravenous contrast. RADIATION DOSE REDUCTION: This exam was performed according to the departmental dose-optimization program which includes automated exposure control, adjustment of the mA and/or kV according to patient size and/or use of iterative reconstruction technique. CONTRAST:  OMNIPAQUE IOHEXOL 300 MG/ML  SOLN COMPARISON:  None Available. FINDINGS: Lower chest: Clear lung bases. No significant pleural or pericardial effusion. The heart is  enlarged status post median sternotomy and mitral valve replacement. Hepatobiliary: The liver is normal in density without highly suspicious focal abnormality. There are multiple hepatic cysts, measuring up to 2.3 cm in the  left lobe on image 12/5. Inferiorly in the right lobe, there is a peripherally enhancing 1.1 cm low-density lesion on image 27/5 which becomes less evident on the delayed images, likely reflecting a hemangioma. Status post cholecystectomy. No evidence of biliary dilatation. Pancreas: Mild atrophy. No focal abnormality, ductal dilatation or surrounding inflammation. Spleen: Normal in size without focal abnormality. Adrenals/Urinary Tract: Both adrenal glands appear normal. No evidence of urinary tract calculus, suspicious renal lesion or hydronephrosis. Multiple bilateral renal cysts, measuring up to 7.7 cm in the upper pole the right kidney for which no specific follow-up imaging recommended. The urinary bladder appears unremarkable for its degree of distention. Stomach/Bowel: No enteric contrast administered. The stomach appears unremarkable for its degree of distension. No evidence of bowel wall thickening, distention or surrounding inflammatory change. The appendix appears normal. Vascular/Lymphatic: There are no enlarged abdominal or pelvic lymph nodes. Aortic and branch vessel atherosclerosis without evidence of aneurysm or large vessel occlusion. Reproductive: The prostate gland is markedly enlarged and heterogeneous, measuring up to 8.0 x 6.4 cm transverse. Other: Heterogeneous enlargement of the right rectus abdominus muscle, measuring up to 8.2 x 6.5 cm transverse on image 64/5 and up to 13.9 cm in length on sagittal image 71/12 with intermediate density, likely reflecting a hematoma. Mild contralateral enlargement of the left rectus sheath. Surrounding soft tissue stranding in the subcutaneous and intraperitoneal fat. There is mild nonspecific perirectal soft tissue stranding, but no significant retroperitoneal hematoma. No evidence of hemoperitoneum or pneumoperitoneum. Possible herniorrhaphy changes in the left inguinal region. Musculoskeletal: No acute or significant osseous findings. Chronic appearing  Schmorl's node involving the superior endplate of T12. Multilevel spondylosis. IMPRESSION: 1. Heterogeneous enlargement of the right rectus abdominus muscle, likely reflecting a hematoma. Mild contralateral enlargement of the left rectus sheath. Surrounding soft tissue stranding in the subcutaneous and intraperitoneal fat. 2. No evidence of significant retroperitoneal hematoma or hemoperitoneum. 3. No other acute findings. 4. Markedly enlarged and heterogeneous prostate gland. 5. Small peripherally enhancing low-density lesion in the right lobe of the liver, likely reflecting a hemangioma. This could be more definitively characterized with MRI without and with contrast, if clinically warranted. Additional simple appearing hepatic and renal cysts bilaterally. 6.  Aortic Atherosclerosis (ICD10-I70.0). Electronically Signed   By: Carey Bullocks M.D.   On: 07/07/2023 15:19   CT Head Wo Contrast  Result Date: 07/07/2023 CLINICAL DATA:  Head trauma, altered mental status (Ped 0-17y) EXAM: CT HEAD WITHOUT CONTRAST TECHNIQUE: Contiguous axial images were obtained from the base of the skull through the vertex without intravenous contrast. RADIATION DOSE REDUCTION: This exam was performed according to the departmental dose-optimization program which includes automated exposure control, adjustment of the mA and/or kV according to patient size and/or use of iterative reconstruction technique. COMPARISON:  None Available. FINDINGS: Brain: Diffuse cerebral atrophy. No acute intracranial abnormality. Specifically, no hemorrhage, hydrocephalus, mass lesion, acute infarction, or significant intracranial injury. Vascular: No hyperdense vessel or unexpected calcification. Skull: No acute calvarial abnormality. Sinuses/Orbits: No acute findings Other: None IMPRESSION: No acute intracranial abnormality. Electronically Signed   By: Charlett Nose M.D.   On: 07/07/2023 14:48     Medical Consultants:   None.   Subjective:     KAEDE EISNER has no new complaints.  Objective:    Vitals:   07/07/23  2218 07/08/23 0205 07/08/23 0600 07/08/23 1004  BP: 129/84 116/81 117/62 103/68  Pulse: 97 (!) 120 100 87  Resp: 16   17  Temp: 98.4 F (36.9 C) 98.6 F (37 C) 98.7 F (37.1 C) 98.8 F (37.1 C)  TempSrc: Axillary Axillary Oral Oral  SpO2: 98% 97% 99% 98%  Weight:      Height:  5\' 6"  (1.676 m)     SpO2: 98 %   Intake/Output Summary (Last 24 hours) at 07/08/2023 1044 Last data filed at 07/08/2023 0900 Gross per 24 hour  Intake 420 ml  Output --  Net 420 ml   Filed Weights   06/27/23 1008  Weight: 72.6 kg    Exam: General exam: In no acute distress. Respiratory system: Good air movement and clear to auscultation. Cardiovascular system: S1 & S2 heard, RRR. No JVD. Gastrointestinal system: Abdomen is nondistended, soft and nontender.  Central nervous system: Alert and oriented. No focal neurological deficits. Extremities: No pedal edema. Skin: No rashes, lesions or ulcers  Data Reviewed:    Labs: Basic Metabolic Panel: Recent Labs  Lab 07/02/23 0759 07/07/23 1242 07/08/23 0127 07/08/23 0142  NA 138 139 140  --   K 4.3 4.0 3.6  --   CL 101 101 100  --   CO2 28 31 30   --   GLUCOSE 100* 143* 116*  --   BUN 25* 41* 42*  --   CREATININE 1.00 0.99 1.02  --   CALCIUM 8.6* 8.8* 8.7*  --   MG  --   --   --  2.3   GFR Estimated Creatinine Clearance: 48.6 mL/min (by C-G formula based on SCr of 1.02 mg/dL). Liver Function Tests: Recent Labs  Lab 07/02/23 0759 07/07/23 1242  AST 21 27  ALT 14 17  ALKPHOS 55 54  BILITOT 1.1 1.6*  PROT 6.2* 6.3*  ALBUMIN 3.8 3.7   No results for input(s): "LIPASE", "AMYLASE" in the last 168 hours. Recent Labs  Lab 07/02/23 0759  AMMONIA <10   Coagulation profile Recent Labs  Lab 07/03/23 1154 07/04/23 1154 07/05/23 1308 07/07/23 0655 07/08/23 0127  INR 3.6* 3.6* 3.1* 2.6* 3.3*   COVID-19 Labs  No results for input(s): "DDIMER",  "FERRITIN", "LDH", "CRP" in the last 72 hours.  Lab Results  Component Value Date   SARSCOV2NAA NEGATIVE 09/23/2019    CBC: Recent Labs  Lab 07/02/23 0759 07/03/23 0611 07/05/23 1308 07/07/23 1000 07/07/23 2236 07/08/23 0127 07/08/23 1001  WBC 6.2 9.8 7.4 6.5  --  7.8  --   NEUTROABS  --   --   --  5.0  --   --   --   HGB 14.3 12.6* 10.7* 10.0* 9.8* 9.8* 8.8*  HCT 46.2 38.9* 33.5* 32.3* 30.8* 30.8* 27.8*  MCV 91.7 89.6 91.0 92.0  --  90.9  --   PLT 252 259 231 279  --  333  --    Cardiac Enzymes: No results for input(s): "CKTOTAL", "CKMB", "CKMBINDEX", "TROPONINI" in the last 168 hours. BNP (last 3 results) No results for input(s): "PROBNP" in the last 8760 hours. CBG: Recent Labs  Lab 07/07/23 2127 07/08/23 0807  GLUCAP 107* 97   D-Dimer: No results for input(s): "DDIMER" in the last 72 hours. Hgb A1c: Recent Labs    07/07/23 2236  HGBA1C 4.7*   Lipid Profile: No results for input(s): "CHOL", "HDL", "LDLCALC", "TRIG", "CHOLHDL", "LDLDIRECT" in the last 72 hours. Thyroid function studies: No results for input(s): "TSH", "  T4TOTAL", "T3FREE", "THYROIDAB" in the last 72 hours.  Invalid input(s): "FREET3" Anemia work up: No results for input(s): "VITAMINB12", "FOLATE", "FERRITIN", "TIBC", "IRON", "RETICCTPCT" in the last 72 hours. Sepsis Labs: Recent Labs  Lab 07/03/23 0611 07/05/23 1308 07/07/23 1000 07/08/23 0127  WBC 9.8 7.4 6.5 7.8   Microbiology No results found for this or any previous visit (from the past 240 hour(s)).   Medications:    divalproex  125 mg Oral TID   empagliflozin  10 mg Oral QAC breakfast   escitalopram  20 mg Oral q AM   feeding supplement  237 mL Oral BID BM   gabapentin  300 mg Oral Daily   insulin aspart  0-15 Units Subcutaneous TID WC   insulin aspart  0-5 Units Subcutaneous QHS   tamsulosin  0.4 mg Oral QHS   warfarin  2 mg Oral ONCE-1600   Warfarin - Pharmacist Dosing Inpatient   Does not apply q1600   Continuous  Infusions:    LOS: 0 days   Marinda Elk  [facility]  07/08/2023, 10:44 AM

## 2023-07-08 NOTE — Progress Notes (Signed)
Family requests palliative care/comfort care consult to help manage care moving forward.

## 2023-07-08 NOTE — Progress Notes (Signed)
PHARMACY - ANTICOAGULATION CONSULT NOTE  Pharmacy Consult for Coumadin Indication:  mechanical MVR, + afib with h/o CVAs   Allergies  Allergen Reactions   Aricept [Donepezil Hcl] Other (See Comments)    Hallucinations    Namenda [Memantine Hcl] Other (See Comments)    Hallucinations    Olanzapine Anxiety    Agitation, aggression.    Patient Measurements: Height: 5\' 6"  (167.6 cm) Weight: 72.6 kg (160 lb) IBW/kg (Calculated) : 63.8  Vital Signs: Temp: 98.7 F (37.1 C) (12/04 0600) Temp Source: Oral (12/04 0600) BP: 117/62 (12/04 0600) Pulse Rate: 100 (12/04 0600)  Labs: Recent Labs    07/05/23 1308 07/07/23 0655 07/07/23 1000 07/07/23 1242 07/07/23 2236 07/08/23 0127  HGB 10.7*  --  10.0*  --  9.8* 9.8*  HCT 33.5*  --  32.3*  --  30.8* 30.8*  PLT 231  --  279  --   --  333  LABPROT 32.1* 27.9*  --   --   --  34.0*  INR 3.1* 2.6*  --   --   --  3.3*  CREATININE  --   --   --  0.99  --  1.02    Estimated Creatinine Clearance: 48.6 mL/min (by C-G formula based on SCr of 1.02 mg/dL).   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   Mitral insufficiency    Mobitz type 1 second degree atrioventricular block    Palpitations    Pseudoaneurysm    of forehead   Pure hypercholesterolemia 10/07/2010   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   Stroke 06/09/2018   Brain MRI revealed two remote tiny infarcts in the left  cerebellum   Vitamin D deficiency 10/07/2010    Assessment: AC/Heme: hx mechanical MVR, + afib with h/o CVAs on warfarin PTA   >Goal 2.5-3.5 - ED geri- psych hold: Admit INR 3.1 (11/23) on Coumadin 2.5mg  daily - 12/3: Now inpatient admit from geri-psych ED hold for bilateral rectus sheath hematomas + anemia. Confirmed with admitting TRH ok to continue warfarin for now, monitoring serial CBC  - 12/4: Hgb 9.8, Plts WNL, INR 3.3 up today  Goal of Therapy:  2.5-3.5 Monitor platelets by anticoagulation protocol: Yes   Plan:  Coumadin 2mg  po x 1 tonight Daily INR   Mykel Sponaugle S. Merilynn Finland, PharmD, BCPS Clinical Staff Pharmacist [email or web address] Merilynn Finland, Carime Dinkel Stillinger 07/08/2023,8:18 AM

## 2023-07-08 NOTE — Progress Notes (Signed)
Pt speaking in a different language when saying words this shift. Family stated his first language was [place], but also speaks [place] and english.

## 2023-07-09 DIAGNOSIS — Z515 Encounter for palliative care: Secondary | ICD-10-CM | POA: Diagnosis not present

## 2023-07-09 DIAGNOSIS — Z7189 Other specified counseling: Secondary | ICD-10-CM | POA: Diagnosis not present

## 2023-07-09 DIAGNOSIS — F03911 Unspecified dementia, unspecified severity, with agitation: Secondary | ICD-10-CM

## 2023-07-09 DIAGNOSIS — Z66 Do not resuscitate: Secondary | ICD-10-CM

## 2023-07-09 DIAGNOSIS — S301XXA Contusion of abdominal wall, initial encounter: Secondary | ICD-10-CM | POA: Diagnosis not present

## 2023-07-09 DIAGNOSIS — R4589 Other symptoms and signs involving emotional state: Secondary | ICD-10-CM

## 2023-07-09 DIAGNOSIS — Z79899 Other long term (current) drug therapy: Secondary | ICD-10-CM

## 2023-07-09 DIAGNOSIS — F03918 Unspecified dementia, unspecified severity, with other behavioral disturbance: Secondary | ICD-10-CM | POA: Diagnosis not present

## 2023-07-09 LAB — PROTIME-INR
INR: 4.2 (ref 0.8–1.2)
Prothrombin Time: 40.5 s — ABNORMAL HIGH (ref 11.4–15.2)

## 2023-07-09 LAB — GLUCOSE, CAPILLARY
Glucose-Capillary: 101 mg/dL — ABNORMAL HIGH (ref 70–99)
Glucose-Capillary: 82 mg/dL (ref 70–99)

## 2023-07-09 MED ORDER — BIOTENE DRY MOUTH MT LIQD: 15.0000 mL | OROMUCOSAL | Status: DC | PRN

## 2023-07-09 MED ORDER — GLYCOPYRROLATE 0.2 MG/ML IJ SOLN: 0.2000 mg | INTRAMUSCULAR | Status: DC | PRN

## 2023-07-09 MED ORDER — HALOPERIDOL LACTATE 5 MG/ML IJ SOLN
1.0000 mg | INTRAMUSCULAR | Status: DC | PRN
  Administered 2023-07-09: 1 mg via INTRAVENOUS
  Filled 2023-07-09: qty 1

## 2023-07-09 MED ORDER — LORAZEPAM 2 MG/ML IJ SOLN
0.5000 mg | INTRAMUSCULAR | Status: DC | PRN
  Administered 2023-07-10: 0.5 mg via INTRAVENOUS
  Filled 2023-07-09 (×2): qty 1

## 2023-07-09 MED ORDER — MORPHINE SULFATE (PF) 2 MG/ML IV SOLN
1.0000 mg | INTRAVENOUS | Status: DC | PRN
  Administered 2023-07-10 (×2): 2 mg via INTRAVENOUS
  Filled 2023-07-09 (×2): qty 1

## 2023-07-09 MED ORDER — POLYVINYL ALCOHOL 1.4 % OP SOLN
1.0000 [drp] | Freq: Four times a day (QID) | OPHTHALMIC | Status: DC | PRN
  Filled 2023-07-09: qty 15

## 2023-07-09 MED ORDER — LORAZEPAM 2 MG/ML IJ SOLN
0.5000 mg | Freq: Four times a day (QID) | INTRAMUSCULAR | Status: DC
  Administered 2023-07-09 – 2023-07-10 (×3): 0.5 mg via INTRAVENOUS
  Filled 2023-07-09 (×2): qty 1

## 2023-07-09 NOTE — TOC Progression Note (Signed)
Transition of Care [facility]) - Progression Note    Patient Details  Name: Nathan Mcdaniel MRN: [id number] Date of Birth: 1940/09/07  Transition [facility]) CM/SW Contact  Caid Radin, Olegario Messier, RN Phone Number: 07/09/2023, 11:00 AM  Clinical Narrative: Referral for Geri psych placement-Noted for palliative care cons-[facility]. [name](spouse), Vickie(dtr) in agreement to Palliative Care meeting-they decline geri psych placement.     Expected Discharge Plan:  (TBD) Barriers to Discharge: Continued Medical Work up  Expected Discharge Plan and Services   Discharge Planning Services: CM Consult Post Acute Care Choice:  (ALF/memory care) Living arrangements for the past 2 months: Single Family Home                                       Social Determinants of Health (SDOH) Interventions SDOH Screenings   Food Insecurity: Patient Unable To Answer (07/08/2023)  Housing: Patient Unable To Answer (07/08/2023)  Transportation Needs: No Transportation Needs (03/10/2023)   Received from [facility]  Utilities: Patient Unable To Answer (07/08/2023)  Financial Resource Strain: Low Risk  (03/10/2023)   Received from [facility]  Physical Activity: Insufficiently Active (03/10/2023)   Received from [facility]  Social Connections: Moderately Integrated (03/10/2023)   Received from [facility]  Stress: No Stress Concern Present (03/10/2023)   Received from [facility]  Tobacco Use: Medium Risk (06/27/2023)    Readmission Risk Interventions     No data to display

## 2023-07-09 NOTE — Consult Note (Addendum)
Consultation Note Date: 07/09/2023   Patient Name: Nathan Mcdaniel  DOB: August 24, 1940  MRN: [id number]  Age / Sex: 82 y.o., male   PCP: Beam, Chales Salmon, MD Referring Physician: David Stall, Darin Engels, MD  Reason for Consultation: Establishing goals of care     Chief Complaint/History of Present Illness:   Patient is a 82 year old male with a past medical history of chronic atrial fibrillation, mechanical valve replacement in 2002 on Coumadin, and vascular dementia with behavioral disturbances who was admitted on 06/27/2023 for worsening agitation.  Patient remained in the ER awaiting Geri psych placement until 07/08/2023 when he was noted to have right flank hematoma and developed anemia secondary to this.  During hospitalization, patient has received management for hematoma and Coumadin management.  Patient has required increasing medication management for agitation.  Patient having less oral intake.  Surgery consulted though recommended conservative management of hematoma.  Pal medicine team consulted to assist with complex medical decision making.  Extensive review of EMR prior to presenting to bedside.  Past 24 hours, patient has required as needed Ativan 0.5 mg x 3 doses for agitation.  Presented to bedside to meet with patient.  Initially no family present at bedside.  Patient laying asleep in the bed.  Patient with mittens in place.  Patient appears cachectic and frail. Discussed care with nurse who noted would reach out once family presented to bedside.  Able to visit later in day once patient's wife and daughter were present.  Family wanted to speak privately as to not agitate the patient.  Able to speak with patient's wife, daughter, and son-in-law privately.  Introduced myself and the role of the palliative medicine team in patient's medical care.  Spent time learning about patient's medical journey up into this point.  Patient was diagnosed with a vascular dementia a few years ago  officially.  Able to discuss patient's continued deterioration over time and worsening agitation.  Family noted that patient has had decreased oral intake at home and more confusion. Described that since patient has been in the hospital, has not really wanted to eat or drink.  With permission, able to discuss signs that someone is reaching the end of life.  Spent time discussing concerns about terminal delirium with patient's family.  Family acknowledged this.   Inquired how family wanted to proceed.  Family noted that patient was incredibly independent and would never want interventions to "prolong" his life.  Discussed possible pathways for medical care moving forward.  At this time family opting to transition to comfort focused care.  Described this would entail providing medications for symptom management such as pain, agitation, and shortness of breath.  Discussed with discontinue interventions such as lab work, IV fluids, and aggressive medical interventions.  Family agreeing with transition to full comfort focused care at this time.  Expressed concern with patient's worsening agitation, will likely need scheduled medication.  Family agreeing with this understanding that patient would himself rather be asleep and calm over agitated and confused.  Discussed with this worry patient will likely not be able to take his Coumadin with his mechanical valve and family acknowledged risk of stroke.  Discussed priority would be patient's comfort moving forward.  Introduced the idea of hospice.  Family members had taking care of other loved ones who had died with hospice care.  Family specifically requesting evaluation for [facility], inpatient hospice.  Acknowledged and would let care team know.  Family appropriately tearful during discussion.  Spent time providing  emotional support as able.  Thanked family for allowing me to meet with them today.  Updated IDT after visit and discussion today.  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   History of skin cancer    Hypertension    Major depressive disorder    Memory disorder    Mild neurocognitive disorder, 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   Mitral insufficiency    Mobitz type 1 second degree atrioventricular block    Palpitations    Pseudoaneurysm    of forehead   Pure hypercholesterolemia 10/07/2010   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   Stroke 06/09/2018   Brain MRI revealed two remote tiny infarcts in the left cerebellum   Vitamin D deficiency 10/07/2010   Social History   Socioeconomic History   Marital status: Married    Spouse name: Not on file   Number of children: 2   Years of education: 18   Highest education level: Master's degree (e.g., MA, MS, MEng, MEd, MSW, MBA)  Occupational History   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    Comment: retired Hotel manager  Tobacco Use   Smoking status: Former    Current packs/day: 0.00    Average packs/day: 0.8 packs/day for 18.0 years (14.4 ttl pk-yrs)    Types: Cigarettes    Start date: 08/04/1961    Quit date: 08/05/1979    Years since quitting: 43.9   Smokeless tobacco: Never  Vaping Use   Vaping status: Never Used  Substance and Sexual Activity   Alcohol use: Not Currently    Comment: occasional glass of wine   Drug use: No   Sexual activity: Not on file  Other Topics Concern   Not on file  Social History Narrative   Right  handed    Lives with wife    Social Determinants of Health   Financial Resource Strain: Low Risk  (03/10/2023)   Received from [facility]   Overall Financial Resource Strain (CARDIA)    Difficulty of Paying Living Expenses: Not hard at all  Food Insecurity: Patient Unable To Answer (07/08/2023)   Hunger Vital Sign    Worried About Radiation protection practitioner of Food in the Last Year: Patient unable to answer    Ran Out of Food in the Last Year: Patient unable to answer  Transportation Needs: No Transportation Needs (03/10/2023)   Received from [facility] - Transportation    Lack of Transportation (Medical): No    Lack of Transportation (Non-Medical): No  Physical Activity: Insufficiently Active (03/10/2023)   [facility] from [facility]   Exercise Vital Sign    Days of Exercise per Week: 3 days    Minutes of Exercise per Session: 30 min  Stress: No Stress Concern Present (03/10/2023)   Received from [facility] - Occupational Stress Questionnaire    Feeling of Stress : Not at all  Social Connections: Moderately Integrated (03/10/2023)  Received from [employer]   Social Network    How would you rate your social network (family, work, friends)?: Adequate participation with social networks   Family History  Problem Relation Age of Onset   Heart failure Mother    Hypertension Mother    Lung cancer Sister    Scheduled Meds:  divalproex  125 mg Oral TID   empagliflozin  10 mg Oral QAC breakfast   escitalopram  20 mg Oral q AM   feeding supplement  237 mL Oral BID BM   gabapentin  300 mg Oral Daily   insulin aspart  0-15 Units Subcutaneous TID WC   insulin aspart  0-5 Units Subcutaneous QHS   tamsulosin  0.4 mg Oral QHS   Warfarin - Pharmacist Dosing Inpatient   Does not apply q1600   Continuous Infusions: PRN Meds:.acetaminophen, albuterol, alum & mag hydroxide-simeth, LORazepam, ondansetron, risperiDONE **AND** [COMPLETED] LORazepam  **AND** [COMPLETED] ziprasidone, senna-docusate Allergies  Allergen Reactions   Aricept [Donepezil Hcl] Other (See Comments)    Hallucinations    Namenda [Memantine Hcl] Other (See Comments)    Hallucinations    Olanzapine Anxiety    Agitation, aggression.   CBC:    Component Value Date/Time   WBC 7.8 07/08/2023 0127   HGB 8.5 (L) 07/08/2023 1653   HGB 13.2 05/15/2023 1519   HCT 26.4 (L) 07/08/2023 1653   HCT 42.8 05/15/2023 1519   PLT 333 07/08/2023 0127   PLT 248 05/15/2023 1519   MCV 90.9 07/08/2023 0127   MCV 88 05/15/2023 1519   NEUTROABS 5.0 07/07/2023 1000   LYMPHSABS 0.9 07/07/2023 1000   MONOABS 0.5 07/07/2023 1000   EOSABS 0.1 07/07/2023 1000   BASOSABS 0.0 07/07/2023 1000   Comprehensive Metabolic Panel:    Component Value Date/Time   NA 140 07/08/2023 0127   NA 143 05/15/2023 1519   K 3.6 07/08/2023 0127   CL 100 07/08/2023 0127   CO2 30 07/08/2023 0127   BUN 42 (H) 07/08/2023 0127   BUN 21 05/15/2023 1519   CREATININE 1.02 07/08/2023 0127   GLUCOSE 116 (H) 07/08/2023 0127   CALCIUM 8.7 (L) 07/08/2023 0127   AST 27 07/07/2023 1242   ALT 17 07/07/2023 1242   ALKPHOS 54 07/07/2023 1242   BILITOT 1.6 (H) 07/07/2023 1242   PROT 6.3 (L) 07/07/2023 1242   ALBUMIN 3.7 07/07/2023 1242    Physical Exam: Vital Signs: BP 126/73 (BP Location: Left Arm)   Pulse 60   Temp 98.2 F (36.8 C) (Oral)   Resp 17   Ht 5\' 6"  (1.676 m)   Wt 72.6 kg   SpO2 97%   BMI 25.82 kg/m  SpO2: SpO2: 97 % O2 Device: O2 Device: Room Air O2 Flow Rate:   Intake/output summary:  Intake/Output Summary (Last 24 hours) at 07/09/2023 0952 Last data filed at 07/08/2023 1142 Gross per 24 hour  Intake 0 ml  Output --  Net 0 ml   LBM: Last BM Date : 07/07/23 Baseline Weight: Weight: 72.6 kg Most recent weight: Weight: 72.6 kg           Palliative Performance Scale: 10%              Additional Data Reviewed: Recent Labs    07/07/23 1000 07/07/23 1242 07/07/23 2236  07/08/23 0127 07/08/23 1001 07/08/23 1653  WBC 6.5  --   --  7.8  --   --   HGB 10.0*  --    < > 9.8*  8.8* 8.5*  PLT 279  --   --  333  --   --   NA  --  139  --  140  --   --   BUN  --  41*  --  42*  --   --   CREATININE  --  0.99  --  1.02  --   --    < > = values in this interval not displayed.    Imaging: CT ABDOMEN PELVIS W CONTRAST CLINICAL DATA:  Retroperitoneal hematoma, known or suspected. Altered mental status. Head trauma. On anticoagulation. No reported history of malignancy.  EXAM: CT ABDOMEN AND PELVIS WITH CONTRAST  TECHNIQUE: Multidetector CT imaging of the abdomen and pelvis was performed using the standard protocol following bolus administration of intravenous contrast.  RADIATION DOSE REDUCTION: This exam was performed according to the departmental dose-optimization program which includes automated exposure control, adjustment of the mA and/or kV according to patient size and/or use of iterative reconstruction technique.  CONTRAST:  OMNIPAQUE IOHEXOL 300 MG/ML  SOLN  COMPARISON:  None Available.  FINDINGS: Lower chest: Clear lung bases. No significant pleural or pericardial effusion. The heart is enlarged status post median sternotomy and mitral valve replacement.  Hepatobiliary: The liver is normal in density without highly suspicious focal abnormality. There are multiple hepatic cysts, measuring up to 2.3 cm in the left lobe on image 12/5. Inferiorly in the right lobe, there is a peripherally enhancing 1.1 cm low-density lesion on image 27/5 which becomes less evident on the delayed images, likely reflecting a hemangioma. Status post cholecystectomy. No evidence of biliary dilatation.  Pancreas: Mild atrophy. No focal abnormality, ductal dilatation or surrounding inflammation.  Spleen: Normal in size without focal abnormality.  Adrenals/Urinary Tract: Both adrenal glands appear normal. No evidence of urinary tract calculus, suspicious  renal lesion or hydronephrosis. Multiple bilateral renal cysts, measuring up to 7.7 cm in the upper pole the right kidney for which no specific follow-up imaging recommended. The urinary bladder appears unremarkable for its degree of distention.  Stomach/Bowel: No enteric contrast administered. The stomach appears unremarkable for its degree of distension. No evidence of bowel wall thickening, distention or surrounding inflammatory change. The appendix appears normal.  Vascular/Lymphatic: There are no enlarged abdominal or pelvic lymph nodes. Aortic and branch vessel atherosclerosis without evidence of aneurysm or large vessel occlusion.  Reproductive: The prostate gland is markedly enlarged and heterogeneous, measuring up to 8.0 x 6.4 cm transverse.  Other: Heterogeneous enlargement of the right rectus abdominus muscle, measuring up to 8.2 x 6.5 cm transverse on image 64/5 and up to 13.9 cm in length on sagittal image 71/12 with intermediate density, likely reflecting a hematoma. Mild contralateral enlargement of the left rectus sheath. Surrounding soft tissue stranding in the subcutaneous and intraperitoneal fat. There is mild nonspecific perirectal soft tissue stranding, but no significant retroperitoneal hematoma. No evidence of hemoperitoneum or pneumoperitoneum. Possible herniorrhaphy changes in the left inguinal region.  Musculoskeletal: No acute or significant osseous findings. Chronic appearing Schmorl's node involving the superior endplate of T12. Multilevel spondylosis.  IMPRESSION: 1. Heterogeneous enlargement of the right rectus abdominus muscle, likely reflecting a hematoma. Mild contralateral enlargement of the left rectus sheath. Surrounding soft tissue stranding in the subcutaneous and intraperitoneal fat. 2. No evidence of significant retroperitoneal hematoma or hemoperitoneum. 3. No other acute findings. 4. Markedly enlarged and heterogeneous prostate  gland. 5. Small peripherally enhancing low-density lesion in the right lobe of the liver, likely reflecting a hemangioma. This  could be more definitively characterized with MRI without and with contrast, if clinically warranted. Additional simple appearing hepatic and renal cysts bilaterally. 6.  Aortic Atherosclerosis (ICD10-I70.0).  Electronically Signed   By: Carey Bullocks M.D.   On: 07/07/2023 15:19 CT Head Wo Contrast CLINICAL DATA:  Head trauma, altered mental status (Ped 0-17y)  EXAM: CT HEAD WITHOUT CONTRAST  TECHNIQUE: Contiguous axial images were obtained from the base of the skull through the vertex without intravenous contrast.  RADIATION DOSE REDUCTION: This exam was performed according to the departmental dose-optimization program which includes automated exposure control, adjustment of the mA and/or kV according to patient size and/or use of iterative reconstruction technique.  COMPARISON:  None Available.  FINDINGS: Brain: Diffuse cerebral atrophy. No acute intracranial abnormality. Specifically, no hemorrhage, hydrocephalus, mass lesion, acute infarction, or significant intracranial injury.  Vascular: No hyperdense vessel or unexpected calcification.  Skull: No acute calvarial abnormality.  Sinuses/Orbits: No acute findings  Other: None  IMPRESSION: No acute intracranial abnormality.  Electronically Signed   By: Charlett Nose M.D.   On: 07/07/2023 14:48    I personally reviewed recent imaging.   Palliative Care Assessment and Plan Summary of Established Goals of Care and Medical Treatment Preferences   Patient is a 82 year old male with a past medical history of chronic atrial fibrillation, mechanical valve replacement in 2002 on Coumadin, and vascular dementia with behavioral disturbances who was admitted on 06/27/2023 for worsening agitation.  Patient remained in the ER awaiting Geri psych placement until 07/08/2023 when he was noted to have  right flank hematoma and developed anemia secondary to this.  During hospitalization, patient has received management for hematoma and Coumadin management.  Patient has required increasing medication management for agitation.  Patient having less oral intake.  Surgery consulted though recommended conservative management of hematoma.  Pal medicine team consulted to assist with complex medical decision making.  # Complex medical decision making/goals of care  -Patient unable to participate in medical decision making secondary to his mental status.  -Spoke with patient's wife, daughter, and son-in-law as detailed above in HPI.  Discussed possible pathways for medical care moving forward.  Based on family's description of events, concerned patient is now to stage of suffering from terminal delirium in the setting of his worsening vascular dementia.  Family noted priority would be to focus on patient's comfort.  Patient himself had stated to family long ago he did not want any life-prolonging interventions.  Acknowledged transition to full comfort focused care.  Family recognizing  medication can make patient sleepy; would rather patient be resting/comfortable over awake/agitated. Family requesting evaluation by [facility] for inpatient hospice referral.  -At this time we will discontinue interventions that are no longer focused on comfort such as IV fluids, imaging, or lab work.  Will instead focus on symptom management of pain, dyspnea, and agitation in the setting of end-of-life care.    Code Status: Do not attempt resuscitation (DNR) - Comfort care  # Symptom management    -Pain/Dyspnea, acute in the setting of end-of-life care                               -Start IV Morphine in 1-3 mg every 1 hour as needed.  Continue to adjust based on patient's symptom burden.  If patient needing frequent dosing, may need to consider continuous infusion.                  -  Anxiety/agitation, in the setting of  end-of-life care   -Start IV Ativan 0.5 mg every 6 hours scheduled based on patient's needs previously and continued agitation.                               -Start IV Ativan 0.5 mg every 4 hours as needed. Continue to adjust based on patient's symptom burden.                                 -Start IV Haldol 1 mg every 4 hours as needed. Continue to adjust based on patient's symptom burden.    # Psycho-social/Spiritual Support:  - Support System: Wife, daughter, son-in-law - Desire for further Chaplain support:yes, placed consult  # Discharge Planning:  Hospice facility, [facility]  Thank you for allowing the palliative care team to participate in the care Nathan Mcdaniel.  Alvester Morin, DO Palliative Care Provider PMT # [phone]  If patient remains symptomatic despite maximum doses, please call PMT at [phone] between 0700 and 1900. Outside of these hours, please call attending, as PMT does not have night coverage.  Personally spent 95 minutes in patient care including extensive chart review (labs, imaging, progress/consult notes, vital signs), medically appropraite exam, discussed with treatment team, education to patient, family, and staff, documenting clinical information, medication review and management, coordination of care, and available advanced directive documents.   *Please note that this is a verbal dictation therefore any spelling or grammatical errors are due to the "[employer]agon [employer] One" system interpretation.

## 2023-07-09 NOTE — Progress Notes (Signed)
Physical Therapy Discharge Patient Details Name: Nathan Mcdaniel MRN: [id number] DOB: 12-Mar-1941 Today's Date: 07/09/2023 Time:  -     Patient discharged from PT services secondary to medical decline -notes indicate family interested in comfort care.  Please see latest therapy progress note for current level of functioning and progress toward goals. Patient was not considered a rehab candidate as noted.     Progress and discharge plan discussed with patient and/or caregiver: NA. Blanchard Kelch PT Acute Rehabilitation Services Office [phone] Weekend [phone]   GP     [name] 07/09/2023, 8:07 AM

## 2023-07-09 NOTE — Progress Notes (Signed)
Chaplain attempted to meet with Nathan Mcdaniel and family to provide support but family was not at bedside and Nathan Mcdaniel was resting. Chaplain will attempt at a later time, but please page if urgent needs arise.  [address], Bcc Pager, [phone]

## 2023-07-09 NOTE — Progress Notes (Addendum)
[facility] PROGRESS NOTE    Progress Note  Nathan Mcdaniel  RUE:[id number] DOB: [birth year] DOA: 06/27/2023 PCP: Pollyann Samples, MD     Brief Narrative:   Nathan Mcdaniel is an 82 y.o. male past medical history significant for chronic atrial fibrillation with a Saint Jude mechanical valve replacement in 2002 on Coumadin to the hospital for observation due to rectus sheath hematoma and anemia, apparently was brought to the emergency room by family on 06/27/2023 and has been waiting geriatric psych placement since then.  However was notice of right flank pain hematoma and has developed anemia on 07/02/2023 hemoglobin was 14 on admission it was 9.8.  Currently the patient is pleasantly confused due to a right flank hematoma CT scan of the abdomen pelvis was done and showed a large right rectus hematoma with mild contralateral enlargement of the left rectus sheath with surrounding soft tissue stranding no evidence of retroperitoneal hematoma or hemoperitoneum.  Assessment/Plan:   Rectus sheath hematoma associated with anemia: He is hemodynamically stable. His INR was elevated at 4.6.,  This morning is 3.3. General surgery was curb sided recommended conservative management. Goal and INR is 2.5-3.5 Hemoglobin has remained stable 9-8.  Continue to monitor hemoglobin intermittently. Family requested palliative care consult, I believe they are trying to move towards comfort measures.  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  Diabetes mellitus type 2: Continue Jardiance. A1c of 4.7.  Dementia with behavioral disturbance (HCC) Continue Depakote, awaiting geriatric psych placement.   DVT prophylaxis: coumadin Family Communication:none Status is: Observation The patient remains OBS appropriate and will d/c before 2 midnights.    Code Status:     Code Status Orders  (From admission, onward)            Start     Ordered   07/07/23 1719  Do not attempt resuscitation (DNR) Pre-Arrest Interventions Desired  Continuous       Question Answer Comment  If pulseless and not breathing No CPR or chest compressions.   In Pre-Arrest Conditions (Patient Has Pulse and Is Breathing) May intubate, use advanced airway interventions and cardioversion/ACLS medications if appropriate or indicated. May transfer to ICU.   Consent: Discussion documented in EHR or advanced directives reviewed      07/07/23 1719           Code Status History     Date Active Date Inactive Code Status Order ID Comments User Context   07/07/2023 1134 07/07/2023 1719 Do not attempt resuscitation (DNR) PRE-ARREST INTERVENTIONS DESIRED [id number]  Alvira Monday, MD ED   06/27/2023 1236 07/07/2023 1134 Full Code [id number]  Fayrene Helper, PA-C ED         IV Access:   Peripheral IV   Procedures and diagnostic studies:   CT ABDOMEN PELVIS W CONTRAST  Result Date: 07/07/2023 CLINICAL DATA:  Retroperitoneal hematoma, known or suspected. Altered mental status. Head trauma. On anticoagulation. No reported history of malignancy. EXAM: CT ABDOMEN AND PELVIS WITH CONTRAST TECHNIQUE: Multidetector CT imaging of the abdomen and pelvis was performed using the standard protocol following bolus administration of intravenous contrast. RADIATION DOSE REDUCTION: This exam was performed according to the departmental dose-optimization program which includes automated exposure control, adjustment of the mA and/or kV according to patient size and/or use of iterative reconstruction technique. CONTRAST:  OMNIPAQUE IOHEXOL 300 MG/ML  SOLN COMPARISON:  None Available. FINDINGS: Lower chest: Clear lung bases. No significant pleural  or pericardial effusion. The heart is enlarged status post median sternotomy and mitral valve replacement. Hepatobiliary: The liver is normal in density without highly suspicious focal abnormality. There are multiple  hepatic cysts, measuring up to 2.3 cm in the left lobe on image 12/5. Inferiorly in the right lobe, there is a peripherally enhancing 1.1 cm low-density lesion on image 27/5 which becomes less evident on the delayed images, likely reflecting a hemangioma. Status post cholecystectomy. No evidence of biliary dilatation. Pancreas: Mild atrophy. No focal abnormality, ductal dilatation or surrounding inflammation. Spleen: Normal in size without focal abnormality. Adrenals/Urinary Tract: Both adrenal glands appear normal. No evidence of urinary tract calculus, suspicious renal lesion or hydronephrosis. Multiple bilateral renal cysts, measuring up to 7.7 cm in the upper pole the right kidney for which no specific follow-up imaging recommended. The urinary bladder appears unremarkable for its degree of distention. Stomach/Bowel: No enteric contrast administered. The stomach appears unremarkable for its degree of distension. No evidence of bowel wall thickening, distention or surrounding inflammatory change. The appendix appears normal. Vascular/Lymphatic: There are no enlarged abdominal or pelvic lymph nodes. Aortic and branch vessel atherosclerosis without evidence of aneurysm or large vessel occlusion. Reproductive: The prostate gland is markedly enlarged and heterogeneous, measuring up to 8.0 x 6.4 cm transverse. Other: Heterogeneous enlargement of the right rectus abdominus muscle, measuring up to 8.2 x 6.5 cm transverse on image 64/5 and up to 13.9 cm in length on sagittal image 71/12 with intermediate density, likely reflecting a hematoma. Mild contralateral enlargement of the left rectus sheath. Surrounding soft tissue stranding in the subcutaneous and intraperitoneal fat. There is mild nonspecific perirectal soft tissue stranding, but no significant retroperitoneal hematoma. No evidence of hemoperitoneum or pneumoperitoneum. Possible herniorrhaphy changes in the left inguinal region. Musculoskeletal: No acute or  significant osseous findings. Chronic appearing Schmorl's node involving the superior endplate of T12. Multilevel spondylosis. IMPRESSION: 1. Heterogeneous enlargement of the right rectus abdominus muscle, likely reflecting a hematoma. Mild contralateral enlargement of the left rectus sheath. Surrounding soft tissue stranding in the subcutaneous and intraperitoneal fat. 2. No evidence of significant retroperitoneal hematoma or hemoperitoneum. 3. No other acute findings. 4. Markedly enlarged and heterogeneous prostate gland. 5. Small peripherally enhancing low-density lesion in the right lobe of the liver, likely reflecting a hemangioma. This could be more definitively characterized with MRI without and with contrast, if clinically warranted. Additional simple appearing hepatic and renal cysts bilaterally. 6.  Aortic Atherosclerosis (ICD10-I70.0). Electronically Signed   By: Carey Bullocks M.D.   On: 07/07/2023 15:19   CT Head Wo Contrast  Result Date: 07/07/2023 CLINICAL DATA:  Head trauma, altered mental status (Ped 0-17y) EXAM: CT HEAD WITHOUT CONTRAST TECHNIQUE: Contiguous axial images were obtained from the base of the skull through the vertex without intravenous contrast. RADIATION DOSE REDUCTION: This exam was performed according to the departmental dose-optimization program which includes automated exposure control, adjustment of the mA and/or kV according to patient size and/or use of iterative reconstruction technique. COMPARISON:  None Available. FINDINGS: Brain: Diffuse cerebral atrophy. No acute intracranial abnormality. Specifically, no hemorrhage, hydrocephalus, mass lesion, acute infarction, or significant intracranial injury. Vascular: No hyperdense vessel or unexpected calcification. Skull: No acute calvarial abnormality. Sinuses/Orbits: No acute findings Other: None IMPRESSION: No acute intracranial abnormality. Electronically Signed   By: Charlett Nose M.D.   On: 07/07/2023 14:48      Medical Consultants:   None.   Subjective:    Nathan Mcdaniel no complaints  Objective:  Vitals:   07/08/23 1004 07/08/23 2100 07/09/23 0048 07/09/23 0400  BP: 103/68 119/61 132/61 126/73  Pulse: 87 (!) 118 62 60  Resp: 17 18 18 17   Temp: 98.8 F (37.1 C) 99 F (37.2 C) 98.6 F (37 C) 98.2 F (36.8 C)  TempSrc: Oral Oral Oral Oral  SpO2: 98% 98% 98% 97%  Weight:      Height:       SpO2: 97 %   Intake/Output Summary (Last 24 hours) at 07/09/2023 0929 Last data filed at 07/08/2023 1142 Gross per 24 hour  Intake 0 ml  Output --  Net 0 ml   Filed Weights   06/27/23 1008  Weight: 72.6 kg    Exam: General exam: In no acute distress. Respiratory system: Good air movement and clear to auscultation. Cardiovascular system: S1 & S2 heard, RRR. No JVD. Gastrointestinal system: Abdomen is nondistended, soft and nontender.  Extremities: No pedal edema. Skin: No rashes, lesions or ulcers Data Reviewed:    Labs: Basic Metabolic Panel: Recent Labs  Lab 07/07/23 1242 07/08/23 0127 07/08/23 0142  NA 139 140  --   K 4.0 3.6  --   CL 101 100  --   CO2 31 30  --   GLUCOSE 143* 116*  --   BUN 41* 42*  --   CREATININE 0.99 1.02  --   CALCIUM 8.8* 8.7*  --   MG  --   --  2.3   GFR Estimated Creatinine Clearance: 48.6 mL/min (by C-G formula based on SCr of 1.02 mg/dL). Liver Function Tests: Recent Labs  Lab 07/07/23 1242  AST 27  ALT 17  ALKPHOS 54  BILITOT 1.6*  PROT 6.3*  ALBUMIN 3.7   No results for input(s): "LIPASE", "AMYLASE" in the last 168 hours. No results for input(s): "AMMONIA" in the last 168 hours.  Coagulation profile Recent Labs  Lab 07/03/23 1154 07/04/23 1154 07/05/23 1308 07/07/23 0655 07/08/23 0127  INR 3.6* 3.6* 3.1* 2.6* 3.3*   COVID-19 Labs  No results for input(s): "DDIMER", "FERRITIN", "LDH", "CRP" in the last 72 hours.  Lab Results  Component Value Date   SARSCOV2NAA NEGATIVE 09/23/2019    CBC: Recent Labs   Lab 07/03/23 0611 07/05/23 1308 07/07/23 1000 07/07/23 2236 07/08/23 0127 07/08/23 1001 07/08/23 1653  WBC 9.8 7.4 6.5  --  7.8  --   --   NEUTROABS  --   --  5.0  --   --   --   --   HGB 12.6* 10.7* 10.0* 9.8* 9.8* 8.8* 8.5*  HCT 38.9* 33.5* 32.3* 30.8* 30.8* 27.8* 26.4*  MCV 89.6 91.0 92.0  --  90.9  --   --   PLT 259 231 279  --  333  --   --    Cardiac Enzymes: No results for input(s): "CKTOTAL", "CKMB", "CKMBINDEX", "TROPONINI" in the last 168 hours. BNP (last 3 results) No results for input(s): "PROBNP" in the last 8760 hours. CBG: Recent Labs  Lab 07/08/23 0807 07/08/23 1153 07/08/23 1621 07/08/23 2152 07/09/23 0749  GLUCAP 97 138* 113* 93 101*   D-Dimer: No results for input(s): "DDIMER" in the last 72 hours. Hgb A1c: Recent Labs    07/07/23 2236  HGBA1C 4.7*   Lipid Profile: No results for input(s): "CHOL", "HDL", "LDLCALC", "TRIG", "CHOLHDL", "LDLDIRECT" in the last 72 hours. Thyroid function studies: No results for input(s): "TSH", "T4TOTAL", "T3FREE", "THYROIDAB" in the last 72 hours.  Invalid input(s): "FREET3" Anemia work up: No  results for input(s): "VITAMINB12", "FOLATE", "FERRITIN", "TIBC", "IRON", "RETICCTPCT" in the last 72 hours. Sepsis Labs: Recent Labs  Lab 07/03/23 0611 07/05/23 1308 07/07/23 1000 07/08/23 0127  WBC 9.8 7.4 6.5 7.8   Microbiology No results found for this or any previous visit (from the past 240 hour(s)).   Medications:    divalproex  125 mg Oral TID   empagliflozin  10 mg Oral QAC breakfast   escitalopram  20 mg Oral q AM   feeding supplement  237 mL Oral BID BM   gabapentin  300 mg Oral Daily   insulin aspart  0-15 Units Subcutaneous TID WC   insulin aspart  0-5 Units Subcutaneous QHS   tamsulosin  0.4 mg Oral QHS   Warfarin - Pharmacist Dosing Inpatient   Does not apply q1600   Continuous Infusions:    LOS: 1 day   Marinda Elk  [facility]  07/09/2023, 9:29 AM

## 2023-07-09 NOTE — Progress Notes (Signed)
PHARMACY - ANTICOAGULATION CONSULT NOTE  Pharmacy Consult for Coumadin Indication:  mechanical MVR, + afib with h/o CVAs   Allergies  Allergen Reactions   Aricept [Donepezil Hcl] Other (See Comments)    Hallucinations    Namenda [Memantine Hcl] Other (See Comments)    Hallucinations    Olanzapine Anxiety    Agitation, aggression.    Patient Measurements: Height: 5\' 6"  (167.6 cm) Weight: 72.6 kg (160 lb) IBW/kg (Calculated) : 63.8  Vital Signs: Temp: 98.2 F (36.8 C) (12/05 0400) Temp Source: Oral (12/05 0400) BP: 126/73 (12/05 0400) Pulse Rate: 60 (12/05 0400)  Labs: Recent Labs    07/07/23 0655 07/07/23 1000 07/07/23 1242 07/07/23 2236 07/08/23 0127 07/08/23 1001 07/08/23 1653 07/09/23 0537  HGB  --  10.0*  --    < > 9.8* 8.8* 8.5*  --   HCT  --  32.3*  --    < > 30.8* 27.8* 26.4*  --   PLT  --  279  --   --  333  --   --   --   LABPROT 27.9*  --   --   --  34.0*  --   --  40.5*  INR 2.6*  --   --   --  3.3*  --   --  4.2*  CREATININE  --   --  0.99  --  1.02  --   --   --    < > = values in this interval not displayed.    Estimated Creatinine Clearance: 48.6 mL/min (by C-G formula based on SCr of 1.02 mg/dL).   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   Mitral insufficiency    Mobitz type 1 second degree atrioventricular block    Palpitations    Pseudoaneurysm    of forehead   Pure  hypercholesterolemia 10/07/2010   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   Stroke 06/09/2018   Brain MRI revealed two remote tiny infarcts in the left cerebellum   Vitamin D deficiency 10/07/2010    Assessment: AC/Heme: hx mechanical MVR, + afib with h/o CVAs on warfarin PTA   >Goal 2.5-3.5 - ED geri- psych hold: Admit INR 3.1 (11/23) on Coumadin 2.5mg  daily - 12/3: Now inpatient admit from geri-psych ED hold for bilateral rectus sheath hematomas + anemia. Confirmed with admitting TRH ok to continue warfarin for now, monitoring serial CBC   INR now SUPRAtherapeutic No reported bleeding  Goal of Therapy:  2.5-3.5 Monitor platelets by anticoagulation protocol: Yes   Plan:  No warfarin today Daily INR   Hessie Knows, PharmD, BCPS Secure Chat if ?s 07/09/2023 10:59 AM

## 2023-07-09 NOTE — TOC Progression Note (Signed)
Transition of Care [facility]) - Progression Note    Patient Details  Name: Nathan Mcdaniel MRN: [id number] Date of Birth: 09-11-1940  Transition of [facility] CM/SW Contact  Leathie Weich, Olegario Messier, RN Phone Number: 07/09/2023, 12:06 PM  Clinical Narrative:  Referral for [facility]-await eval/outcome from rep.     Expected Discharge Plan: Hospice Medical Facility Barriers to Discharge: Continued Medical Work up  Expected Discharge Plan and Services   Discharge Planning Services: CM Consult Post Acute Care Choice:  (ALF/memory care) Living arrangements for the past 2 months: Single Family Home                                       Social Determinants of Health (SDOH) Interventions SDOH Screenings   Food Insecurity: Patient Unable To Answer (07/08/2023)  Housing: Patient Unable To Answer (07/08/2023)  Transportation Needs: No Transportation Needs (03/10/2023)   Received from [facility]  Utilities: Patient Unable To Answer (07/08/2023)  Financial Resource Strain: Low Risk  (03/10/2023)   Received from [facility]  Physical Activity: Insufficiently Active (03/10/2023)   Received from [facility]  Social Connections: Moderately Integrated (03/10/2023)   Received from [facility]  Stress: No Stress Concern Present (03/10/2023)   Received from [facility]  Tobacco Use: Medium Risk (06/27/2023)    Readmission Risk Interventions     No data to display

## 2023-07-10 DIAGNOSIS — F03918 Unspecified dementia, unspecified severity, with other behavioral disturbance: Secondary | ICD-10-CM | POA: Diagnosis not present

## 2023-07-10 DIAGNOSIS — F03B11 Unspecified dementia, moderate, with agitation: Secondary | ICD-10-CM | POA: Diagnosis not present

## 2023-07-10 DIAGNOSIS — M7981 Nontraumatic hematoma of soft tissue: Secondary | ICD-10-CM

## 2023-07-10 DIAGNOSIS — S301XXA Contusion of abdominal wall, initial encounter: Secondary | ICD-10-CM | POA: Diagnosis not present

## 2023-07-10 DIAGNOSIS — F03911 Unspecified dementia, unspecified severity, with agitation: Secondary | ICD-10-CM | POA: Diagnosis not present

## 2023-07-10 DIAGNOSIS — Z515 Encounter for palliative care: Secondary | ICD-10-CM | POA: Diagnosis not present

## 2023-07-10 MED ORDER — HALOPERIDOL LACTATE 5 MG/ML IJ SOLN: 1.0000 mg | INTRAMUSCULAR | Status: DC | PRN

## 2023-07-10 NOTE — Progress Notes (Signed)
Pt extremely agitated last night. Received multiple doses of PRN medications. Agitation continued this am. Medicated with pain medication and medication for agitation. Pt finally resting comfortable. Family and sitter at bedside.

## 2023-07-10 NOTE — Progress Notes (Signed)
Daily Progress Note   Patient Name: Nathan Mcdaniel       Date: 07/10/2023 DOB: 05/09/1941  Age: 82 y.o. MRN#: [id number] Attending Physician: Marinda Elk, MD Primary Care Physician: Pollyann Samples, MD Admit Date: 06/27/2023 Length of Stay: 2 days  Reason for Consultation/Follow-up: Establishing goals of care  Subjective:   CC: Patient sleeping comfortably when seen.  Following up regarding complex medical decision making.  Subjective:  Reviewed EMR prior to presenting to bedside.  At time patient is receiving IV Ativan 0.5 mg every 6 hours scheduled.  Patient has also received as needed medication of Haldol 1 mg x 1 dose, IV Ativan 0.5 mg x 1 dose, and IV morphine 2 mg x 1 dose.  Discussed care with bedside RN for updates.  Noted worsening agitation overnight into the morning as patient did not receive scheduled Ativan.  RN this morning has appropriately manage symptoms with as needed medications and patient is now resting comfortably. When seeing patient he is sleeping and appears comfortable without any signs of distress.  Patient is out of mittens.  Presented to bedside later in the afternoon once informed family at bedside.  Patient's daughter Chip Boer present at bedside.  Able to discuss plan of care for today.  Patient has received offer for bed at [facility].  Planning on transfer this afternoon when able.  Spent time providing emotional support.  All questions answered as able. Also able to meet patient's other daughter.  Introduced myself as a member of the palliative medicine team.  Able to again review patient's overall medical conditions and how focusing on comfort at this time in setting of terminal delirium.  His daughter is a International aid/development worker and acknowledges and appreciates care for her father.  All questions answered at that time.  Thanked family for allowing me to visit with them today.  Objective:   Vital Signs:  BP (!) 126/91 (BP Location: Right Arm)   Pulse (!)  108   Temp 97.9 F (36.6 C) (Oral)   Resp (!) 24   Ht 5\' 6"  (1.676 m)   Wt 72.6 kg   SpO2 (!) 88%   BMI 25.82 kg/m   Physical Exam: General: Sleeping, chronically ill-appearing, frail  Imaging: I personally reviewed recent imaging.   Assessment & Plan:   Assessment: Patient is a 82 year old male with a past medical history of chronic atrial fibrillation, mechanical valve replacement in 2002 on Coumadin, and vascular dementia with behavioral disturbances who was admitted on 06/27/2023 for worsening agitation. Patient remained in the ER awaiting Geri psych placement until 07/08/2023 when he was noted to have right flank hematoma and developed anemia secondary to this. During hospitalization, patient has received management for hematoma and Coumadin management. Patient has required increasing medication management for agitation. Patient having less oral intake. Surgery consulted though recommended conservative management of hematoma. Pal medicine team consulted to assist with complex medical decision making.   Recommendations/Plan: # Complex medical decision making/goals of care     -Patient unable to participate in medical decision making secondary to his mental status.                -Discussion with patient's wife and daughter on 07/09/2023, patient transition to comfort focused care.  Family reports patient has been accepted to inpatient hospice at [facility] and hope is patient can transfer there today for end-of-life care.                  Code  Status: Do not attempt resuscitation (DNR) - Comfort care   # Symptom management                  -Pain/Dyspnea, acute in the setting of end-of-life care                               -Continue IV Morphine in 1-3 mg every 1 hour as needed.  Continue to adjust based on patient's symptom burden.  If patient needing frequent dosing, may need to consider continuous infusion.                  -Anxiety/agitation, in the setting of end-of-life  care                               -Continue IV Ativan 0.5 mg every 6 hours scheduled based on patient's needs previously and continued agitation.                               -Continue IV Ativan 0.5 mg every 4 hours as needed. Continue to adjust based on patient's symptom burden.                                 -Continue IV Haldol 1 mg every 4 hours as needed. Continue to adjust based on patient's symptom burden.    # Psycho-social/Spiritual Support:  - Support System: Wife, daughter, son-in-law - Desire for further Chaplain support:yes, placed consult  # Discharge Planning: Hospice facility  Discussed with: Patient's daughters, hospitalist, [facility], [facility] liaison, RN  Thank you for allowing the palliative care team to participate in the care Esmond Plants.  Alvester Morin, DO Palliative Care Provider PMT # [phone]  If patient remains symptomatic despite maximum doses, please call PMT at [phone] between 0700 and 1900. Outside of these hours, please call attending, as PMT does not have night coverage.  Personally spent 37 minutes in patient care including extensive chart review (labs, imaging, progress/consult notes, vital signs), medically appropraite exam, discussed with treatment team, education to patient, family, and staff, documenting clinical information, medication review and management, coordination of care, and available advanced directive documents.   *Please note that this is a verbal dictation therefore any spelling or grammatical errors are due to the "[employer]agon [employer] One" system interpretation.

## 2023-07-10 NOTE — Progress Notes (Signed)
   07/10/23 1600  Spiritual Encounters  Type of Visit Initial  Care provided to: Pt and family  Reason for visit Urgent spiritual support  OnCall Visit No  Spiritual Framework  Presenting Themes Meaning/purpose/sources of inspiration;Caregiving needs;Significant life change;Values and beliefs;Impactful experiences and emotions;Rituals and practive  Values/beliefs [employer]  Community/Connection Faith community;Family  Patient Stress Factors None identified  Family Stress Factors Family relationships;Loss;Loss of control;Major life changes  Intervention Outcomes  Outcomes Connection to spiritual care;Connection to values and goals of care;Awareness around self/spiritual resourses;Awareness of support;Reduced anxiety;Reduced fear;Reduced isolation   Chaplain met with patient at bedside who was being visited by his two adult daughters.  Wife at home - sisters awaiting D/C to Hospice this day.  Sisters were very emotional and Chaplain provided support and emotional and spiritual care.

## 2023-07-10 NOTE — Progress Notes (Signed)
PHARMACY - ANTICOAGULATION CONSULT NOTE  Pharmacy Consult for Coumadin Indication:  mechanical MVR, + afib with h/o CVAs   Allergies  Allergen Reactions   Aricept [Donepezil Hcl] Other (See Comments)    Hallucinations    Namenda [Memantine Hcl] Other (See Comments)    Hallucinations    Olanzapine Anxiety    Agitation, aggression.    Patient Measurements: Height: 5\' 6"  (167.6 cm) Weight: 72.6 kg (160 lb) IBW/kg (Calculated) : 63.8  Vital Signs: Temp: 97.9 F (36.6 C) (12/05 1947) Temp Source: Oral (12/05 1947) BP: 126/91 (12/05 1947) Pulse Rate: 108 (12/05 1947)  Labs: Recent Labs    07/07/23 1000 07/07/23 1242 07/07/23 2236 07/08/23 0127 07/08/23 1001 07/08/23 1653 07/09/23 0537  HGB 10.0*  --    < > 9.8* 8.8* 8.5*  --   HCT 32.3*  --    < > 30.8* 27.8* 26.4*  --   PLT 279  --   --  333  --   --   --   LABPROT  --   --   --  34.0*  --   --  40.5*  INR  --   --   --  3.3*  --   --  4.2*  CREATININE  --  0.99  --  1.02  --   --   --    < > = values in this interval not displayed.    Estimated Creatinine Clearance: 48.6 mL/min (by C-G formula based on SCr of 1.02 mg/dL).   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   Mitral insufficiency    Mobitz type 1 second degree atrioventricular block    Palpitations    Pseudoaneurysm    of forehead   Pure hypercholesterolemia 10/07/2010   S/P mitral valve  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/2012   Stroke 06/09/2018   Brain MRI revealed two remote tiny infarcts in the left cerebellum   Vitamin D deficiency 10/07/2010    Assessment:  AC/Heme: hx mechanical MVR, + afib with h/o CVAs on warfarin PTA   >Goal 2.5-3.5 - ED geri- psych hold: Admit INR 3.1 (11/23) on Coumadin 2.5mg  daily - 12/3: Now inpatient admit from geri-psych ED hold for bilateral rectus sheath hematomas (poss from his restraints on anticoag) + anemia. Confirmed with admitting TRH ok to continue warfarin for now, monitoring serial CBC  - 12/6: Hgb 8.5 trending down, INR up to 4.2  Goal of Therapy:  2.5-3.5 Monitor platelets by anticoagulation protocol: Yes   Plan:  Hold Coumadin today Daily INR   Dwana Garin S. Merilynn Finland, PharmD, BCPS Clinical Staff Pharmacist [email or web address] Merilynn Finland, [employer] 07/10/2023,7:35 AM

## 2023-07-10 NOTE — Plan of Care (Signed)
  Problem: Education: Goal: Ability to describe self-care measures that may prevent or decrease complications (Diabetes Survival Skills Education) will improve Outcome: Progressing Goal: Individualized Educational Video(s) Outcome: Progressing   Problem: Coping: Goal: Ability to adjust to condition or change in health will improve Outcome: Progressing   Problem: Fluid Volume: Goal: Ability to maintain a balanced intake and output will improve Outcome: Progressing   Problem: Health Behavior/Discharge Planning: Goal: Ability to identify and utilize available resources and services will improve Outcome: Progressing Goal: Ability to manage health-related needs will improve Outcome: Progressing   Problem: Metabolic: Goal: Ability to maintain appropriate glucose levels will improve Outcome: Progressing   Problem: Nutritional: Goal: Maintenance of adequate nutrition will improve Outcome: Progressing Goal: Progress toward achieving an optimal weight will improve Outcome: Progressing   Problem: Skin Integrity: Goal: Risk for impaired skin integrity will decrease Outcome: Progressing   Problem: Tissue Perfusion: Goal: Adequacy of tissue perfusion will improve Outcome: Progressing   Problem: Education: Goal: Knowledge of General Education information will improve Description: Including pain rating scale, medication(s)/side effects and non-pharmacologic comfort measures Outcome: Progressing   Problem: Health Behavior/Discharge Planning: Goal: Ability to manage health-related needs will improve Outcome: Progressing   Problem: Clinical Measurements: Goal: Ability to maintain clinical measurements within normal limits will improve Outcome: Progressing Goal: Will remain free from infection Outcome: Progressing Goal: Diagnostic test results will improve Outcome: Progressing Goal: Respiratory complications will improve Outcome: Progressing Goal: Cardiovascular complication will  be avoided Outcome: Progressing   Problem: Activity: Goal: Risk for activity intolerance will decrease Outcome: Progressing   Problem: Nutrition: Goal: Adequate nutrition will be maintained Outcome: Progressing   Problem: Coping: Goal: Level of anxiety will decrease Outcome: Progressing   Problem: Elimination: Goal: Will not experience complications related to bowel motility Outcome: Progressing Goal: Will not experience complications related to urinary retention Outcome: Progressing   Problem: Pain Management: Goal: General experience of comfort will improve Outcome: Progressing   Problem: Safety: Goal: Ability to remain free from injury will improve Outcome: Progressing   Problem: Skin Integrity: Goal: Risk for impaired skin integrity will decrease Outcome: Progressing   Problem: Education: Goal: Knowledge of the prescribed therapeutic regimen will improve Outcome: Progressing   Problem: Coping: Goal: Ability to identify and develop effective coping behavior will improve Outcome: Progressing   Problem: Clinical Measurements: Goal: Quality of life will improve Outcome: Progressing   Problem: Respiratory: Goal: Verbalizations of increased ease of respirations will increase Outcome: Progressing   Problem: Role Relationship: Goal: Family's ability to cope with current situation will improve Outcome: Progressing Goal: Ability to verbalize concerns, feelings, and thoughts to partner or family member will improve Outcome: Progressing   Problem: Pain Management: Goal: Satisfaction with pain management regimen will improve Outcome: Progressing

## 2023-07-10 NOTE — Discharge Summary (Addendum)
Physician Discharge Summary  Nathan Mcdaniel ZOX:[id number] DOB: 1941-06-05 DOA: 06/27/2023  PCP: Pollyann Samples, MD  Admit date: 06/27/2023 Discharge date: 07/10/2023  Admitted From: Home Disposition: Residential hospice facility   Recommendations for Outpatient Follow-up:    Home Health:no Equipment/Devices:None  Discharge Condition:Stable CODE STATUS:DNR  Diet recommendation: Heart Healthy  Brief/Interim Summary: 82 y.o. male past medical history significant for chronic atrial fibrillation with a Saint Jude mechanical valve replacement in 2002 on Coumadin to the hospital for observation due to rectus sheath hematoma and anemia, apparently was brought to the emergency room by family on 06/27/2023 and has been waiting geriatric psych placement since then.  However was notice of right flank pain hematoma and has developed anemia on 07/02/2023 hemoglobin was 14 on admission it was 9.8.  Currently the patient is pleasantly confused due to a right flank hematoma CT scan of the abdomen pelvis was done and showed a large right rectus hematoma with mild contralateral enlargement of the left rectus sheath with surrounding soft tissue stranding no evidence of retroperitoneal hematoma or hemoperitoneum.   Discharge Diagnoses:  Principal Problem:   Dementia with behavioral disturbance (HCC) Active Problems:   Nontraumatic rectus hematoma   Hematoma of rectus sheath   Need for emotional support   High risk medication use   Agitation due to dementia (HCC)   DNR (do not resuscitate)   Counseling and coordination of care   Goals of care, counseling/discussion   Palliative care encounter  Spontaneous Rectus sheath hematoma associated with anemia due to supratherapeutic INR/coumadin: Patient remained hemodynamically stable his INR was elevated on admission we held Coumadin titrated to try to keep goal INR 2.5-3.5. The case was discussed with surgery who recommended conservative management. His  hemoglobin after this remained stable. After family met with palliative care decided to move to comfort measures. All medications not related to comfort were discontinued.  Mitral valve replacement with mechanical valve Saint Jude in 2002: Goal INR 2.5-3.5. Coumadin was discontinued and it was discussed with the wife.  Diabetes mellitus type 2: Noted.  Dementia with behavioral disturbances: After speaking with the family they decided against geriatric psych. All psychotropic occasions were discontinued he has remained comfortable at bedside with minimal eating.    Discharge Instructions  Discharge Instructions     Diet - low sodium heart healthy   Complete by: As directed    Increase activity slowly   Complete by: As directed       Allergies as of 07/10/2023       Reactions   Aricept [donepezil Hcl] Other (See Comments)   Hallucinations    Namenda [memantine Hcl] Other (See Comments)   Hallucinations    Olanzapine Anxiety   Agitation, aggression.        Medication List     STOP taking these medications    acetaminophen 325 MG tablet Commonly known as: TYLENOL   empagliflozin 10 MG Tabs tablet Commonly known as: Jardiance   escitalopram 20 MG tablet Commonly known as: LEXAPRO   gabapentin 300 MG capsule Commonly known as: NEURONTIN   levocetirizine 5 MG tablet Commonly known as: XYZAL   LORazepam 0.5 MG tablet Commonly known as: ATIVAN   multivitamin with minerals Tabs tablet   OLANZapine zydis 5 MG disintegrating tablet Commonly known as: ZYPREXA   pregabalin 75 MG capsule Commonly known as: LYRICA   tamsulosin 0.4 MG Caps capsule Commonly known as: FLOMAX   warfarin 2.5 MG tablet Commonly known as: COUMADIN  TAKE these medications    haloperidol lactate 5 MG/ML injection Commonly known as: HALDOL Inject 0.2 mLs (1 mg total) into the vein every 4 (four) hours as needed (or delirium).        Allergies  Allergen Reactions    Aricept [Donepezil Hcl] Other (See Comments)    Hallucinations    Namenda [Memantine Hcl] Other (See Comments)    Hallucinations    Olanzapine Anxiety    Agitation, aggression.    Consultations: Palliative care   Procedures/Studies: CT ABDOMEN PELVIS W CONTRAST  Result Date: 07/07/2023 CLINICAL DATA:  Retroperitoneal hematoma, known or suspected. Altered mental status. Head trauma. On anticoagulation. No reported history of malignancy. EXAM: CT ABDOMEN AND PELVIS WITH CONTRAST TECHNIQUE: Multidetector CT imaging of the abdomen and pelvis was performed using the standard protocol following bolus administration of intravenous contrast. RADIATION DOSE REDUCTION: This exam was performed according to the departmental dose-optimization program which includes automated exposure control, adjustment of the mA and/or kV according to patient size and/or use of iterative reconstruction technique. CONTRAST:  OMNIPAQUE IOHEXOL 300 MG/ML  SOLN COMPARISON:  None Available. FINDINGS: Lower chest: Clear lung bases. No significant pleural or pericardial effusion. The heart is enlarged status post median sternotomy and mitral valve replacement. Hepatobiliary: The liver is normal in density without highly suspicious focal abnormality. There are multiple hepatic cysts, measuring up to 2.3 cm in the left lobe on image 12/5. Inferiorly in the right lobe, there is a peripherally enhancing 1.1 cm low-density lesion on image 27/5 which becomes less evident on the delayed images, likely reflecting a hemangioma. Status post cholecystectomy. No evidence of biliary dilatation. Pancreas: Mild atrophy. No focal abnormality, ductal dilatation or surrounding inflammation. Spleen: Normal in size without focal abnormality. Adrenals/Urinary Tract: Both adrenal glands appear normal. No evidence of urinary tract calculus, suspicious renal lesion or hydronephrosis. Multiple bilateral renal cysts, measuring up to 7.7 cm in the upper  pole the right kidney for which no specific follow-up imaging recommended. The urinary bladder appears unremarkable for its degree of distention. Stomach/Bowel: No enteric contrast administered. The stomach appears unremarkable for its degree of distension. No evidence of bowel wall thickening, distention or surrounding inflammatory change. The appendix appears normal. Vascular/Lymphatic: There are no enlarged abdominal or pelvic lymph nodes. Aortic and branch vessel atherosclerosis without evidence of aneurysm or large vessel occlusion. Reproductive: The prostate gland is markedly enlarged and heterogeneous, measuring up to 8.0 x 6.4 cm transverse. Other: Heterogeneous enlargement of the right rectus abdominus muscle, measuring up to 8.2 x 6.5 cm transverse on image 64/5 and up to 13.9 cm in length on sagittal image 71/12 with intermediate density, likely reflecting a hematoma. Mild contralateral enlargement of the left rectus sheath. Surrounding soft tissue stranding in the subcutaneous and intraperitoneal fat. There is mild nonspecific perirectal soft tissue stranding, but no significant retroperitoneal hematoma. No evidence of hemoperitoneum or pneumoperitoneum. Possible herniorrhaphy changes in the left inguinal region. Musculoskeletal: No acute or significant osseous findings. Chronic appearing Schmorl's node involving the superior endplate of T12. Multilevel spondylosis. IMPRESSION: 1. Heterogeneous enlargement of the right rectus abdominus muscle, likely reflecting a hematoma. Mild contralateral enlargement of the left rectus sheath. Surrounding soft tissue stranding in the subcutaneous and intraperitoneal fat. 2. No evidence of significant retroperitoneal hematoma or hemoperitoneum. 3. No other acute findings. 4. Markedly enlarged and heterogeneous prostate gland. 5. Small peripherally enhancing low-density lesion in the right lobe of the liver, likely reflecting a hemangioma. This could be more  definitively characterized  with MRI without and with contrast, if clinically warranted. Additional simple appearing hepatic and renal cysts bilaterally. 6.  Aortic Atherosclerosis (ICD10-I70.0). Electronically Signed   By: Carey Bullocks M.D.   On: 07/07/2023 15:19   CT Head Wo Contrast  Result Date: 07/07/2023 CLINICAL DATA:  Head trauma, altered mental status (Ped 0-17y) EXAM: CT HEAD WITHOUT CONTRAST TECHNIQUE: Contiguous axial images were obtained from the base of the skull through the vertex without intravenous contrast. RADIATION DOSE REDUCTION: This exam was performed according to the departmental dose-optimization program which includes automated exposure control, adjustment of the mA and/or kV according to patient size and/or use of iterative reconstruction technique. COMPARISON:  None Available. FINDINGS: Brain: Diffuse cerebral atrophy. No acute intracranial abnormality. Specifically, no hemorrhage, hydrocephalus, mass lesion, acute infarction, or significant intracranial injury. Vascular: No hyperdense vessel or unexpected calcification. Skull: No acute calvarial abnormality. Sinuses/Orbits: No acute findings Other: None IMPRESSION: No acute intracranial abnormality. Electronically Signed   By: Charlett Nose M.D.   On: 07/07/2023 14:48   DG Chest Port 1 View  Result Date: 06/29/2023 CLINICAL DATA:  Evaluate for TB. EXAM: PORTABLE CHEST 1 VIEW COMPARISON:  Chest x-ray 05/23/2023.  CT cervical spine 05/23/2023. FINDINGS: Prominence of the right paratracheal region corresponds to ectatic vasculature on CT. The heart is enlarged. Patient is status post valve replacement. The lungs are clear. There is no pleural effusion or pneumothorax. No acute fractures are seen. IMPRESSION: 1. No active disease. 2. Cardiomegaly. Electronically Signed   By: Darliss Cheney M.D.   On: 06/29/2023 17:49   CT Head Wo Contrast  Result Date: 06/27/2023 CLINICAL DATA:  82 year old male with increasing confusion. EXAM:  CT HEAD WITHOUT CONTRAST TECHNIQUE: Contiguous axial images were obtained from the base of the skull through the vertex without intravenous contrast. RADIATION DOSE REDUCTION: This exam was performed according to the departmental dose-optimization program which includes automated exposure control, adjustment of the mA and/or kV according to patient size and/or use of iterative reconstruction technique. COMPARISON:  Brain MRI 05/23/2023 and earlier. FINDINGS: Brain: Stable cerebral volume. No midline shift, ventriculomegaly, mass effect, evidence of mass lesion, intracranial hemorrhage or evidence of cortically based acute infarction. Gray-white matter differentiation is stable and largely normal for age. 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 Vascular: No suspicious intracranial vascular hyperdensity. Mild Calcified atherosclerosis at the skull base. Skull: Stable, intact. Sinuses/Orbits: Visualized paranasal sinuses and mastoids are clear. Other: No acute orbit or scalp soft tissue finding. IMPRESSION: Stable and largely negative for age noncontrast CT appearance of the brain. Electronically Signed   By: Odessa Fleming M.D.   On: 06/27/2023 11:29   (Echo, Carotid, EGD, Colonoscopy, ERCP)    Subjective: No complaints  Discharge Exam: Vitals:   07/09/23 1947 07/10/23 0820  BP: (!) 126/91   Pulse: (!) 108   Resp: 16 (!) 24  Temp: 97.9 F (36.6 C)   SpO2: (!) 88%    Vitals:   07/09/23 0400 07/09/23 1707 07/09/23 1947 07/10/23 0820  BP: 126/73 126/61 (!) 126/91   Pulse: 60 94 (!) 108   Resp: 17 20 16  (!) 24  Temp: 98.2 F (36.8 C) 98.5 F (36.9 C) 97.9 F (36.6 C)   TempSrc: Oral Oral Oral   SpO2: 97% 98% (!) 88%   Weight:      Height:        General: Pt is alert, awake, not in acute distress Cardiovascular: RRR, S1/S2 +, no rubs, no gallops Respiratory: CTA bilaterally,  no wheezing, no rhonchi Abdominal: Soft, NT, ND, bowel sounds + Extremities: no edema, no  cyanosis    The results of significant diagnostics from this hospitalization (including imaging, microbiology, ancillary and laboratory) are listed below for reference.     Microbiology: No results found for this or any previous visit (from the past 240 hour(s)).   Labs: BNP (last 3 results) No results for input(s): "BNP" in the last 8760 hours. Basic Metabolic Panel: Recent Labs  Lab 07/07/23 1242 07/08/23 0127 07/08/23 0142  NA 139 140  --   K 4.0 3.6  --   CL 101 100  --   CO2 31 30  --   GLUCOSE 143* 116*  --   BUN 41* 42*  --   CREATININE 0.99 1.02  --   CALCIUM 8.8* 8.7*  --   MG  --   --  2.3   Liver Function Tests: Recent Labs  Lab 07/07/23 1242  AST 27  ALT 17  ALKPHOS 54  BILITOT 1.6*  PROT 6.3*  ALBUMIN 3.7   No results for input(s): "LIPASE", "AMYLASE" in the last 168 hours. No results for input(s): "AMMONIA" in the last 168 hours. CBC: Recent Labs  Lab 07/05/23 1308 07/07/23 1000 07/07/23 2236 07/08/23 0127 07/08/23 1001 07/08/23 1653  WBC 7.4 6.5  --  7.8  --   --   NEUTROABS  --  5.0  --   --   --   --   HGB 10.7* 10.0* 9.8* 9.8* 8.8* 8.5*  HCT 33.5* 32.3* 30.8* 30.8* 27.8* 26.4*  MCV 91.0 92.0  --  90.9  --   --   PLT 231 279  --  333  --   --    Cardiac Enzymes: No results for input(s): "CKTOTAL", "CKMB", "CKMBINDEX", "TROPONINI" in the last 168 hours. BNP: Invalid input(s): "POCBNP" CBG: Recent Labs  Lab 07/08/23 1153 07/08/23 1621 07/08/23 2152 07/09/23 0749 07/09/23 1125  GLUCAP 138* 113* 93 101* 82   D-Dimer No results for input(s): "DDIMER" in the last 72 hours. Hgb A1c Recent Labs    07/07/23 2236  HGBA1C 4.7*   Lipid Profile No results for input(s): "CHOL", "HDL", "LDLCALC", "TRIG", "CHOLHDL", "LDLDIRECT" in the last 72 hours. Thyroid function studies No results for input(s): "TSH", "T4TOTAL", "T3FREE", "THYROIDAB" in the last 72 hours.  Invalid input(s): "FREET3" Anemia work up No results for input(s):  "VITAMINB12", "FOLATE", "FERRITIN", "TIBC", "IRON", "RETICCTPCT" in the last 72 hours. Urinalysis    Component Value Date/Time   COLORURINE YELLOW 06/27/2023 1500   APPEARANCEUR CLEAR 06/27/2023 1500   LABSPEC 1.016 06/27/2023 1500   PHURINE 5.0 06/27/2023 1500   GLUCOSEU 50 (A) 06/27/2023 1500   HGBUR MODERATE (A) 06/27/2023 1500   BILIRUBINUR NEGATIVE 06/27/2023 1500   KETONESUR 5 (A) 06/27/2023 1500   PROTEINUR NEGATIVE 06/27/2023 1500   NITRITE NEGATIVE 06/27/2023 1500   LEUKOCYTESUR NEGATIVE 06/27/2023 1500   Sepsis Labs Recent Labs  Lab 07/05/23 1308 07/07/23 1000 07/08/23 0127  WBC 7.4 6.5 7.8   Microbiology No results found for this or any previous visit (from the past 240 hour(s)).   Time coordinating discharge: Over 35 minutes  SIGNED:   Marinda Elk, MD  [facility] 07/10/2023, 3:20 PM Pager   If 7PM-7AM, please contact night-coverage [email or web address] Password [email or web address]

## 2023-07-10 NOTE — TOC Progression Note (Signed)
Transition of Care [facility]) - Progression Note    Patient Details  Name: Nathan Mcdaniel MRN: [id number] Date of Birth: Aug 12, 1940  [facility]) CM/SW Contact  Facundo Allemand, Olegario Messier, RN Phone Number: 07/10/2023, 10:13 AM  Clinical Narrative: Spoke to [name](spouse) she will talk to Dr. Patterson Hammersmith after lunch today w/f/u concerns, then [name] will f/u with [facility] care rep [name] about [facility](residential hospice).     Expected Discharge Plan: Hospice Medical Facility Barriers to Discharge: Continued Medical Work up  Expected Discharge Plan and Services   Discharge Planning Services: CM Consult Post Acute Care Choice:  (ALF/memory care) Living arrangements for the past 2 months: Single Family Home                                       Social Determinants of Health (SDOH) Interventions SDOH Screenings   Food Insecurity: Patient Unable To Answer (07/08/2023)  Housing: Patient Unable To Answer (07/08/2023)  Transportation Needs: No Transportation Needs (03/10/2023)   Received from [facility]  Utilities: Patient Unable To Answer (07/08/2023)  Financial Resource Strain: Low Risk  (03/10/2023)   Received from [facility]  Physical Activity: Insufficiently Active (03/10/2023)   Received from [facility]  Social Connections: Moderately Integrated (03/10/2023)   Received from [facility]  Stress: No Stress Concern Present (03/10/2023)   [facility]  Tobacco Use: Medium Risk (06/27/2023)    Readmission Risk Interventions     No data to display

## 2023-07-10 NOTE — Progress Notes (Signed)
[facility] Liaison Note  Received request from [facility] for family interest in [facility]. Eligibility confirmed. Met with patient and family to confirm interest and explain services. Family agreeable to transfer today. TOC aware. RN please call report to [phone] prior to patient leaving the unit. Please send signed DNR with patient at discharge.   Thank you for allowing [place] to participate in this patient's care.  Henderson Newcomer, LPN [facility] Liaison [phone]

## 2023-07-10 NOTE — TOC Transition Note (Addendum)
Transition of Care [facility]) - CM/SW Discharge Note   Patient Details  Name: Nathan Mcdaniel MRN: [id number] Date of Birth: 11/20/1940  Transition of Care [facility]) CM/SW Contact:  Lanier Clam, RN Phone Number: 07/10/2023, 3:12 PM   Clinical Narrative: Marcell Anger rep Melissa eval for [facility]-accepted. DNR PTAR forms in printer for PTAR. Report tel#[phone]. PTAR @ d/c. -3:25p PTAR called. No further CM needs.    Final next level of care: [facility]ice [facility] Facility Barriers to Discharge: No Barriers Identified   Patient Goals and CMS Choice CMS Medicare.gov Compare Post Acute Care list provided to:: Patient Represenative (must comment) ([name](spouse)) Choice offered to / list presented to : Spouse  Discharge Placement                         Discharge Plan and Services Additional resources added to the After Visit Summary for     Discharge Planning Services: CM Consult Post Acute Care Choice:  (ALF/memory care)                               Social Determinants of Health (SDOH) Interventions SDOH Screenings   Food Insecurity: Patient Unable To Answer (07/08/2023)  Housing: Patient Unable To Answer (07/08/2023)  Transportation Needs: No Transportation Needs (03/10/2023)   Received from [facility]  Utilities: Patient Unable To Answer (07/08/2023)  Financial Resource Strain: Low Risk  (03/10/2023)   Received from [facility]  Physical Activity: Insufficiently Active (03/10/2023)   Received from [facility]  Social Connections: Moderately Integrated (03/10/2023)   Received from [facility]  Stress: No Stress Concern Present (03/10/2023)   Received from [facility]  Tobacco Use: Medium Risk (06/27/2023)     Readmission Risk Interventions     No data to display

## 2023-07-13 ENCOUNTER — Ambulatory Visit: Payer: Medicare Other | Admitting: Family

## 2023-07-14 ENCOUNTER — Encounter: Payer: Self-pay | Admitting: Cardiology

## 2023-08-05 DEATH — deceased

## 2023-08-19 ENCOUNTER — Ambulatory Visit: Payer: Medicare Other | Admitting: Cardiology
